# Patient Record
Sex: Female | Born: 1985
Health system: Southern US, Community
[De-identification: ages and names within clinical notes are randomized; demographics above are authoritative.]

## PROBLEM LIST (undated history)

## (undated) DIAGNOSIS — G709 Myoneural disorder, unspecified: Secondary | ICD-10-CM

## (undated) DIAGNOSIS — I10 Essential (primary) hypertension: Secondary | ICD-10-CM

## (undated) DIAGNOSIS — N83202 Unspecified ovarian cyst, left side: Secondary | ICD-10-CM

## (undated) DIAGNOSIS — G35D Multiple sclerosis, unspecified: Secondary | ICD-10-CM

## (undated) DIAGNOSIS — T7840XA Allergy, unspecified, initial encounter: Secondary | ICD-10-CM

## (undated) DIAGNOSIS — F419 Anxiety disorder, unspecified: Secondary | ICD-10-CM

## (undated) DIAGNOSIS — G35 Multiple sclerosis: Secondary | ICD-10-CM

## (undated) DIAGNOSIS — K219 Gastro-esophageal reflux disease without esophagitis: Secondary | ICD-10-CM

## (undated) DIAGNOSIS — H539 Unspecified visual disturbance: Secondary | ICD-10-CM

## (undated) HISTORY — DX: Anxiety disorder, unspecified: F41.9

## (undated) HISTORY — DX: Unspecified visual disturbance: H53.9

## (undated) HISTORY — DX: Gastro-esophageal reflux disease without esophagitis: K21.9

## (undated) HISTORY — DX: Allergy, unspecified, initial encounter: T78.40XA

## (undated) HISTORY — PX: WISDOM TOOTH EXTRACTION: SHX21

## (undated) HISTORY — DX: Essential (primary) hypertension: I10

---

## 2004-03-09 ENCOUNTER — Emergency Department (HOSPITAL_COMMUNITY): Admission: EM | Admit: 2004-03-09 | Discharge: 2004-03-09 | Payer: Self-pay | Admitting: Emergency Medicine

## 2004-10-30 ENCOUNTER — Encounter: Admission: RE | Admit: 2004-10-30 | Discharge: 2004-10-30 | Payer: Self-pay | Admitting: Neurology

## 2004-11-07 ENCOUNTER — Encounter: Admission: RE | Admit: 2004-11-07 | Discharge: 2004-11-07 | Payer: Self-pay | Admitting: Neurology

## 2005-09-11 ENCOUNTER — Emergency Department (HOSPITAL_COMMUNITY): Admission: EM | Admit: 2005-09-11 | Discharge: 2005-09-11 | Payer: Self-pay | Admitting: Family Medicine

## 2007-02-19 ENCOUNTER — Emergency Department (HOSPITAL_COMMUNITY): Admission: EM | Admit: 2007-02-19 | Discharge: 2007-02-19 | Payer: Self-pay | Admitting: Emergency Medicine

## 2008-10-18 ENCOUNTER — Emergency Department (HOSPITAL_COMMUNITY): Admission: EM | Admit: 2008-10-18 | Discharge: 2008-10-18 | Payer: Self-pay | Admitting: Family Medicine

## 2010-04-20 ENCOUNTER — Emergency Department (HOSPITAL_COMMUNITY): Admission: EM | Admit: 2010-04-20 | Discharge: 2010-04-20 | Payer: Self-pay | Admitting: Family Medicine

## 2010-10-18 ENCOUNTER — Inpatient Hospital Stay (INDEPENDENT_AMBULATORY_CARE_PROVIDER_SITE_OTHER)
Admission: RE | Admit: 2010-10-18 | Discharge: 2010-10-18 | Disposition: A | Discharge status: Home / Self Care | Payer: 59 | Source: Ambulatory Visit | Attending: Family Medicine | Admitting: Family Medicine

## 2010-10-18 DIAGNOSIS — J029 Acute pharyngitis, unspecified: Secondary | ICD-10-CM

## 2010-11-11 LAB — POCT URINALYSIS DIP (DEVICE)
Bilirubin Urine: NEGATIVE
Ketones, ur: NEGATIVE mg/dL
Specific Gravity, Urine: 1.02 (ref 1.005–1.030)

## 2010-11-11 LAB — POCT PREGNANCY, URINE: Preg Test, Ur: NEGATIVE

## 2010-11-11 LAB — URINE CULTURE

## 2010-11-11 LAB — WET PREP, GENITAL: Trich, Wet Prep: NONE SEEN

## 2010-11-11 LAB — GC/CHLAMYDIA PROBE AMP, GENITAL: GC Probe Amp, Genital: NEGATIVE

## 2011-04-08 ENCOUNTER — Inpatient Hospital Stay (INDEPENDENT_AMBULATORY_CARE_PROVIDER_SITE_OTHER)
Admission: RE | Admit: 2011-04-08 | Discharge: 2011-04-08 | Disposition: A | Discharge status: Home / Self Care | Payer: 59 | Source: Ambulatory Visit | Attending: Family Medicine | Admitting: Family Medicine

## 2011-04-08 DIAGNOSIS — R6889 Other general symptoms and signs: Secondary | ICD-10-CM

## 2013-04-16 ENCOUNTER — Ambulatory Visit (INDEPENDENT_AMBULATORY_CARE_PROVIDER_SITE_OTHER): Payer: PRIVATE HEALTH INSURANCE | Admitting: Internal Medicine

## 2013-04-16 VITALS — BP 124/82 | HR 101 | Temp 99.2°F | Resp 16 | Ht 65.25 in | Wt 244.0 lb

## 2013-04-16 DIAGNOSIS — R05 Cough: Secondary | ICD-10-CM

## 2013-04-16 DIAGNOSIS — J329 Chronic sinusitis, unspecified: Secondary | ICD-10-CM

## 2013-04-16 DIAGNOSIS — R059 Cough, unspecified: Secondary | ICD-10-CM

## 2013-04-16 MED ORDER — HYDROCODONE-HOMATROPINE 5-1.5 MG/5ML PO SYRP: 5.0000 mL | ORAL_SOLUTION | Freq: Four times a day (QID) | ORAL | Status: DC | PRN

## 2013-04-16 MED ORDER — AMOXICILLIN 500 MG PO CAPS: 1000.0000 mg | ORAL_CAPSULE | Freq: Two times a day (BID) | ORAL | Status: AC

## 2013-04-16 NOTE — Progress Notes (Signed)
  Subjective:    Patient ID: Courtney Meza, female    DOB: Apr 30, 1986, 27 y.o.   MRN: [id number]  HPIc/o congestion, sore throat, cough and fever getting progressively worse over the last 5 days A.m. Sputum Fatigue  No underlying illness    Review of Systems Noncontributory    Objective:   Physical Exam  BP 124/82  Pulse 101  Temp(Src) 99.2 F (37.3 C) (Oral)  Resp 16  Ht 5' 5.25" (1.657 m)  Wt 244 lb (110.678 kg)  BMI 40.31 kg/m2  SpO2 99%  LMP 04/09/2013 And conjunctiva clear Nares boggy with purulent discharge Maxillary areas tender to percussion and Throat clear No nodes Chest clear to auscultation      Assessment & Plan:  Acute sinusitis  Meds ordered this encounter  Medications  . HYDROcodone-homatropine (HYCODAN) 5-1.5 MG/5ML syrup    Sig: Take 5 mLs by mouth every 6 (six) hours as needed for cough.    Dispense:  120 mL    Refill:  0  . amoxicillin (AMOXIL) 500 MG capsule    Sig: Take 2 capsules (1,000 mg total) by mouth 2 (two) times daily.    Dispense:  40 capsule    Refill:  0   Sudafed otc

## 2014-08-11 ENCOUNTER — Ambulatory Visit (INDEPENDENT_AMBULATORY_CARE_PROVIDER_SITE_OTHER): Payer: 59 | Admitting: Family Medicine

## 2014-08-11 VITALS — BP 136/92 | HR 86 | Temp 98.6°F | Resp 18 | Ht 65.0 in | Wt 263.0 lb

## 2014-08-11 DIAGNOSIS — R309 Painful micturition, unspecified: Secondary | ICD-10-CM

## 2014-08-11 DIAGNOSIS — M545 Low back pain, unspecified: Secondary | ICD-10-CM

## 2014-08-11 DIAGNOSIS — N390 Urinary tract infection, site not specified: Secondary | ICD-10-CM

## 2014-08-11 LAB — POCT UA - MICROSCOPIC ONLY
CASTS, UR, LPF, POC: NEGATIVE
CRYSTALS, UR, HPF, POC: NEGATIVE
Mucus, UA: POSITIVE
YEAST UA: NEGATIVE

## 2014-08-11 LAB — POCT URINALYSIS DIPSTICK
Bilirubin, UA: NEGATIVE
GLUCOSE UA: NEGATIVE
Ketones, UA: NEGATIVE
Nitrite, UA: POSITIVE
PROTEIN UA: 100
Spec Grav, UA: 1.025
Urobilinogen, UA: 1
pH, UA: 7

## 2014-08-11 MED ORDER — CIPROFLOXACIN HCL 500 MG PO TABS
500.0000 mg | ORAL_TABLET | Freq: Two times a day (BID) | ORAL | Status: DC
Start: 2014-08-11 — End: 2014-08-15

## 2014-08-11 NOTE — Progress Notes (Signed)
Chief Complaint:  Chief Complaint  Patient presents with  . Dysuria    x2 days   . Flank Pain    right     HPI: Courtney Meza is a 29 y.o. female who is here for  2 day history od urinary sxs, increase frequency in last 1 week, now has had pain and spasms at the end of her urination, does not feel complete emptying.  No fevers, chills or nausea. Has some  Right sided flank pain. She ahs some dizziness, She has not taken her blood pressure today. 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 Holds her urine, is sexaully active. No STDs   Past Medical History  Diagnosis Date  . Hypertension    History reviewed. No pertinent past surgical history. History   Social History  . Marital Status: Single    Spouse Name: N/A    Number of Children: N/A  . Years of Education: N/A   Social History Main Topics  . Smoking status: Never Smoker   . Smokeless tobacco: None  . Alcohol Use: None  . Drug Use: None  . Sexual Activity: None   Other Topics Concern  . None   Social History Narrative   Family History  Problem Relation Age of Onset  . Diabetes Mother   . Hypertension Mother   . Diabetes Maternal Grandmother   . Hypertension Maternal Grandmother   . Liver cancer Maternal Grandfather    Allergies  Allergen Reactions  . Latex Hives   Prior to Admission medications   Medication Sig Start Date End Date Taking? Authorizing Provider  nebivolol (BYSTOLIC) 5 MG tablet Take 5 mg by mouth daily.   Yes Historical Provider, MD  HYDROcodone-homatropine (HYCODAN) 5-1.5 MG/5ML syrup Take 5 mLs by mouth every 6 (six) hours as needed for cough. Patient not taking: Reported on 08/11/2014 04/16/13   Leandrew Koyanagi, MD     ROS: The patient denies fevers, chills, night sweats, unintentional weight loss, chest pain, palpitations, wheezing, dyspnea on exertion, nausea, vomiting, abdominal pain,  hematuria, melena, numbness, weakness, or tingling.   All other systems have been reviewed and were  otherwise negative with the exception of those mentioned in the HPI and as above.    PHYSICAL EXAM: Filed Vitals:   08/11/14 0911  BP: 136/92  Pulse: 86  Temp: 98.6 F (37 C)  Resp: 18   Filed Vitals:   08/11/14 0911  Height: 5\' 5"  (1.651 m)  Weight: 263 lb (119.296 kg)   Body mass index is 43.77 kg/(m^2).  General: Alert, no acute distress HEENT:  Normocephalic, atraumatic, oropharynx patent. EOMI, PERRLA Cardiovascular:  Regular rate and rhythm, no rubs murmurs or gallops.  No Carotid bruits, radial pulse intact. No pedal edema.  Respiratory: Clear to auscultation bilaterally.  No wheezes, rales, or rhonchi.  No cyanosis, no use of accessory musculature GI: No organomegaly, abdomen is soft and non-tender, positive bowel sounds.  No masses. Skin: No rashes. Neurologic: Facial musculature symmetric. Psychiatric: Patient is appropriate throughout our interaction. Lymphatic: No cervical lymphadenopathy Musculoskeletal: Gait intact.   LABS: Results for orders placed or performed in visit on 08/11/14  POCT urinalysis dipstick  Result Value Ref Range   Color, UA yellow    Clarity, UA cloudy    Glucose, UA neg    Bilirubin, UA neg    Ketones, UA neg    Spec Grav, UA 1.025    Blood, UA large    pH, UA 7.0  Protein, UA 100    Urobilinogen, UA 1.0    Nitrite, UA pos    Leukocytes, UA small (1+)   POCT UA - Microscopic Only  Result Value Ref Range   WBC, Ur, HPF, POC TNTC    RBC, urine, microscopic TNTC    Bacteria, U Microscopic 4++    Mucus, UA pos    Epithelial cells, urine per micros 1-4    Crystals, Ur, HPF, POC neg    Casts, Ur, LPF, POC neg    Yeast, UA nega      EKG/XRAY:   Primary read interpreted by Dr. Marin Comment at [facility].   ASSESSMENT/PLAN: Encounter Diagnoses  Name Primary?  . Pain with urination   . Right-sided low back pain without sciatica   . UTI (urinary tract infection), uncomplicated Yes   RX cipro x 5 days Urine cx pending Push fluids F/u  prn   Gross sideeffects, risk and benefits, and alternatives of medications d/w patient. Patient is aware that all medications have potential sideeffects and we are unable to predict every sideeffect or drug-drug interaction that may occur.  Boluwatife Mutchler, [place], DO 08/11/2014 10:01 AM

## 2014-08-11 NOTE — Patient Instructions (Signed)

## 2014-08-13 LAB — URINE CULTURE: Colony Count: >=100000

## 2014-08-15 ENCOUNTER — Telehealth: Payer: Self-pay

## 2014-08-15 DIAGNOSIS — N390 Urinary tract infection, site not specified: Secondary | ICD-10-CM

## 2014-08-15 DIAGNOSIS — M545 Low back pain, unspecified: Secondary | ICD-10-CM

## 2014-08-15 DIAGNOSIS — R309 Painful micturition, unspecified: Secondary | ICD-10-CM

## 2014-08-15 MED ORDER — CIPROFLOXACIN HCL 500 MG PO TABS: 500.0000 mg | ORAL_TABLET | Freq: Two times a day (BID) | ORAL | Status: DC

## 2014-08-15 NOTE — Telephone Encounter (Signed)
Please call her and let her know that I sent in a refill for her cipro, if she feels better at day 7 after using abx then stop and do no take anymore, otherwise finish out th eabx for a total of 10 days. Thanks Dr Marin Comment

## 2014-08-15 NOTE — Telephone Encounter (Signed)
Pt called returning a missed call. CB # [phone]

## 2014-08-15 NOTE — Telephone Encounter (Signed)
Lab results call to pt: URine cx grew out E coli, sensitive to Cipro which she is on. How is she feeling? IF she is still symptomatic but better we can extend it out a little longer, she was on it for 5 days.  Spoke to pt- she only has 1 pill left. She does want to try to get a little longer on abx.  She has a little bit of the same sensation. She is having urgency at night while sleeping. No burning just the urgency. No blood in urine

## 2014-08-16 NOTE — Telephone Encounter (Signed)
Pt.notified

## 2016-09-14 ENCOUNTER — Other Ambulatory Visit: Payer: Self-pay | Admitting: Obstetrics and Gynecology

## 2016-09-14 DIAGNOSIS — R947 Abnormal results of other endocrine function studies: Secondary | ICD-10-CM

## 2016-09-23 ENCOUNTER — Ambulatory Visit
Admission: RE | Admit: 2016-09-23 | Discharge: 2016-09-23 | Disposition: A | Discharge status: Home / Self Care | Payer: BLUE CROSS/BLUE SHIELD | Source: Ambulatory Visit | Attending: Obstetrics and Gynecology | Admitting: Obstetrics and Gynecology

## 2016-09-23 DIAGNOSIS — R947 Abnormal results of other endocrine function studies: Secondary | ICD-10-CM

## 2016-09-23 MED ORDER — GADOBENATE DIMEGLUMINE 529 MG/ML IV SOLN
10.0000 mL | Freq: Once | INTRAVENOUS | Status: AC | PRN
  Administered 2016-09-23: 10 mL via INTRAVENOUS

## 2016-09-29 ENCOUNTER — Telehealth: Payer: Self-pay | Admitting: *Deleted

## 2016-09-29 NOTE — Telephone Encounter (Signed)
Per RAS, I have spoken with pt. and given appt. for 10-03-16, arrival time 1pm for a 1:30pm appt/fim

## 2016-10-03 ENCOUNTER — Encounter: Payer: Self-pay | Admitting: *Deleted

## 2016-10-03 ENCOUNTER — Ambulatory Visit (INDEPENDENT_AMBULATORY_CARE_PROVIDER_SITE_OTHER): Payer: BLUE CROSS/BLUE SHIELD | Admitting: Neurology

## 2016-10-03 ENCOUNTER — Encounter: Payer: Self-pay | Admitting: Neurology

## 2016-10-03 VITALS — BP 160/101 | HR 84 | Resp 18 | Ht 65.0 in | Wt 281.0 lb

## 2016-10-03 DIAGNOSIS — R292 Abnormal reflex: Secondary | ICD-10-CM

## 2016-10-03 DIAGNOSIS — E559 Vitamin D deficiency, unspecified: Secondary | ICD-10-CM

## 2016-10-03 DIAGNOSIS — Z87898 Personal history of other specified conditions: Secondary | ICD-10-CM | POA: Diagnosis not present

## 2016-10-03 DIAGNOSIS — G35 Multiple sclerosis: Secondary | ICD-10-CM | POA: Diagnosis not present

## 2016-10-03 DIAGNOSIS — R350 Frequency of micturition: Secondary | ICD-10-CM | POA: Diagnosis not present

## 2016-10-03 DIAGNOSIS — R7989 Other specified abnormal findings of blood chemistry: Secondary | ICD-10-CM

## 2016-10-03 DIAGNOSIS — Z8669 Personal history of other diseases of the nervous system and sense organs: Secondary | ICD-10-CM | POA: Diagnosis not present

## 2016-10-03 DIAGNOSIS — G35A Relapsing-remitting multiple sclerosis: Secondary | ICD-10-CM | POA: Insufficient documentation

## 2016-10-03 NOTE — Progress Notes (Signed)
[facility]  PATIENT: Courtney Meza DOB: Dec 20, 1985  REFERRING DOCTOR OR PCP:  Concha Norway (referred by) is Dr. Melba Coon;  PCP is Glendale Chard SOURCE: Patient, notes from Dr. Melba Coon, [place] and lab reports, MRI images 3 onPACS.  _________________________________   HISTORICAL  CHIEF COMPLAINT:  Chief Complaint  Patient presents with  . Abnormal MRI brain    Courtney Meza is here with her husband Courtney Meza to discuss MRI results.  Sts. due to increased prolactin levels,  and hx. of enlarged pituitary gland her ob/gyn ordered an MRI brain, which is abnormal.  Denies sx.  sts. she does have partial vision loss right eye--sts. around age [age], she was driving home, had a severe h/a, had sudden partial loss of vision right eye. Sts. no clear dx. was reachec/fim    HISTORY OF PRESENT ILLNESS:  I had the pleasure seeing your patient, Courtney Meza, at [facility] for neurologic consultation regarding her abnormal MRI and concern about the possibility of multiple sclerosis.  In April 2006, while driving home she had the sudden onset of reduced right vision like she was looking through a heavy fog.  Vision was unchanged for several months and then began to improve.    Vision improved over the next year but not to baseline and has been stable since.   She feels that vision is still dim on that side and there is less contrast.    She has not noted color vision changes.   Later in 2006, she had the onset of right facial weakness.  She had two weeks of facial twitching and then had facial weakness that seemed to develop over minutes to one hour.  She was told she had Bell's palsy.  She is uncertain whether she had complete loss of strength in the upper face.  She had no other major symptoms over the next decade.   However, last year she had numbness in the left arm over a couple weeks and she was sent for a NCV/EMG.   She feels the numbness completely resolved to  baseline.  The 2006 MRI showed pituitary enlargement.   Prolactin levels were recently elevated and she had a follow-up MRI showing stable pituitary enlargement and a pars intermedius cyst.   She also had multiple white matter lesions, periventricular > juxtacortical, a left midbrain and right pons lesion.  Brain lesions were not present on the previous MRI.  I spoke to her gynecologist, Dr. Melba Coon, last week and reviewed the MRI with her.  She denies any difficulty with gait, strength or sensation. She does have urinary urgency and frequency but no incontinence. She notes nocturia 4 times a night for the past 2 years.    She notes more fatigue over the past 2 years. She is uncertain if this is due to her work as she works night shifts. However, she does sleep well. She does not snore.  She denies any difficulty with mood or cognition.  She had low vitamin D in the past and was on 50,000 units weekly for a while and now takes over-the-counter supplements.  I personally reviewed the MRIs of the brain from 2006 and the MRI from 09/23/2016. The MRI of the brain in 2006 did not show any brain lesions.   However, the second MRI performed 11/07/2004 appeared to show diffusion weighted hyperintensity in the right optic nerve as well as enhancement of the right optic nerve.  This did not appear on the original 10/30/2004 MRI.  The MRI from 09/23/2016 shows multiple T2/FLAIR hyperintense foci in the hemispheres, predominantly in the periventricular white matter but also in the juxtacortical white matter. Additionally there are left midbrain and right pontine lesions.   There is questionable optic nerve enhancement.   MRI images were reviewed in Mr. And Mrs. Courtney Meza's presence.  REVIEW OF SYSTEMS: Constitutional: No fevers, chills, sweats, or change in appetite Eyes: No visual changes, double vision, eye pain Ear, nose and throat: No hearing loss, ear pain, nasal congestion, sore throat Cardiovascular: No  chest pain, palpitations Respiratory: No shortness of breath at rest or with exertion.   No wheezes GastrointestinaI: No nausea, vomiting, diarrhea, abdominal pain, fecal incontinence Genitourinary: No dysuria, urinary retention or frequency.  No nocturia. Musculoskeletal: No neck pain, back pain Integumentary: No rash, pruritus, skin lesions Neurological: as above Psychiatric: No depression at this time.  No anxiety Endocrine: No palpitations, diaphoresis, change in appetite, change in weigh or increased thirst Hematologic/Lymphatic: No anemia, purpura, petechiae. Allergic/Immunologic: No itchy/runny eyes, nasal congestion, recent allergic reactions, rashes  ALLERGIES: Allergies  Allergen Reactions  . Latex Hives    HOME MEDICATIONS:  Current Outpatient Prescriptions:  .  cholecalciferol (VITAMIN D) 1000 units tablet, Take 5,000 Units by mouth daily., Disp: , Rfl:  .  nebivolol (BYSTOLIC) 5 MG tablet, Take 5 mg by mouth daily., Disp: , Rfl:  .  Prenatal Multivit-Min-Fe-FA (PRENATAL VITAMINS PO), Take by mouth., Disp: , Rfl:  .  Dimethyl Fumarate (TECFIDERA) 120 & 240 MG MISC, Take by mouth., Disp: , Rfl:  .  Dimethyl Fumarate 240 MG CPDR, Take 240 mg by mouth 2 (two) times daily., Disp: , Rfl:  .  hydrochlorothiazide (MICROZIDE) 12.5 MG capsule, TK 1 C PO QD, Disp: , Rfl: 1  PAST MEDICAL HISTORY: Past Medical History:  Diagnosis Date  . Hypertension   . Vision abnormalities     PAST SURGICAL HISTORY: No past surgical history on file.  FAMILY HISTORY: Family History  Problem Relation Age of Onset  . Diabetes Mother   . Hypertension Mother   . Diabetes Maternal Grandmother   . Hypertension Maternal Grandmother   . Liver cancer Maternal Grandfather     SOCIAL HISTORY:  Social History   Social History  . Marital status: Single    Spouse name: N/A  . Number of children: N/A  . Years of education: N/A   Occupational History  . Not on file.   Social History  Main Topics  . Smoking status: Never Smoker  . Smokeless tobacco: Never Used  . Alcohol use Not on file  . Drug use: Unknown  . Sexual activity: Not on file   Other Topics Concern  . Not on file   Social History Narrative  . No narrative on file     PHYSICAL EXAM  Vitals:   10/03/16 1324  BP: (!) 160/101  Pulse: 84  Resp: 18  Weight: 281 lb (127.5 kg)  Height: 5\' 5"  (1.651 m)    Body mass index is 46.76 kg/m.   General: The patient is well-developed and well-nourished and in no acute distress  Eyes:  Funduscopic exam shows normal optic discs and retinal vessels.  Neck: The neck is supple, no carotid bruits are noted.  The neck is nontender.  Cardiovascular: The heart has a regular rate and rhythm with a normal S1 and S2. There were no murmurs, gallops or rubs. Lungs are clear to auscultation.  Skin: Extremities are without significant edema.  Musculoskeletal:  Back is  nontender  Neurologic Exam  Mental status: The patient is alert and oriented x 3 at the time of the examination. The patient has apparent normal recent and remote memory, with an apparently normal attention span and concentration ability.   Speech is normal.  Cranial nerves: Extraocular movements are full. VA is 20/30 OS and 20/100 OD.   Has a 1+ right afferent pupillary defect. Pupils equally reactive to accomodation.  Visual fields are full.  She reports symmetric color vision but dimmed vision in general out of the right eye. Facial symmetry is present. There is good facial sensation to soft touch bilaterally.Facial strength is normal.  Trapezius and sternocleidomastoid strength is normal. No dysarthria is noted.  The tongue is midline, and the patient has symmetric elevation of the soft palate. No obvious hearing deficits are noted.  Motor:  Muscle bulk is normal.   Tone is normal. Strength is  5 / 5 in all 4 extremities.   Sensory: Sensory testing is intact to pinprick, soft touch and vibration  sensation in all 4 extremities.  Coordination: Cerebellar testing reveals good finger-nose-finger and heel-to-shin bilaterally.  Gait and station: Station is normal.   Gait is normal. Tandem gait is normal. Romberg is negative.   Reflexes: Deep tendon reflexes are symmetric and normal in arms but elevated in legs with spread at the knees   Plantar responses are flexor.      DIAGNOSTIC DATA (LABS, IMAGING, TESTING) - I reviewed patient records, labs, notes, testing and imaging myself where available.      ASSESSMENT AND PLAN  Multiple sclerosis ([place]) - Plan: VITAMIN D 25 Hydroxy (Vit-D Deficiency, Fractures), CBC with Differential/Platelet, Hepatic function panel  Low vitamin D level - Plan: VITAMIN D 25 Hydroxy (Vit-D Deficiency, Fractures)  History of optic neuritis  History of numbness  Hyperreflexia  Urinary frequency   In summary, Courtney Meza is a 31 year old woman who had an episode of neuritis in 2006, right facial weakness in 2006, left arm numbness in 2016 who has an MRI of the brain showing multiple lesions hemispheres and brainstem consistent with multiple sclerosis. Her exam shows an afferent pupillary defect and hyperreflexia.    Her history, exam and MRI are all consistent with relapsing remitting multiple sclerosis.    To prevent further relapses and disability, we need to initiate a disease modifying therapy. We went over her options and she was most interested in the oral options.   We went over the pros and cons of the different disease modifying therapies and she opted to begin Tecfidera.   We will check a CBC. I'll also check a vitamin D to see if that needs to be supplemented with high-dose supplements again.  She and her husband are thinking about trying to have a baby soon and she will stop the Tecfidera if they decide to do so or if she becomes pregnant.  She will return to see me in 3 months or sooner if there are new or worsening neurologic  symptoms.  Thank you for asking me to see Courtney Meza for neurologic consultation. Please let me know if I can be of further assistance with her or other patients in the future.   Courtney Bart A. Felecia Shelling, MD, PhD [id number], [id number] PM Certified in Neurology, Clinical Neurophysiology, Sleep Medicine, Pain Medicine and Neuroimaging  [facility] [address], [place], [place] [address] [phone]

## 2016-10-04 LAB — CBC WITH DIFFERENTIAL/PLATELET
BASOS ABS: 0 10*3/uL (ref 0.0–0.2)
Basos: 0 %
EOS (ABSOLUTE): 0.1 10*3/uL (ref 0.0–0.4)
Eos: 3 %
Hematocrit: 38.2 % (ref 34.0–46.6)
Hemoglobin: 12.2 g/dL (ref 11.1–15.9)
IMMATURE GRANS (ABS): 0 10*3/uL (ref 0.0–0.1)
IMMATURE GRANULOCYTES: 0 %
LYMPHS: 42 %
Lymphocytes Absolute: 2.2 10*3/uL (ref 0.7–3.1)
MCH: 26.1 pg — ABNORMAL LOW (ref 26.6–33.0)
MCHC: 31.9 g/dL (ref 31.5–35.7)
MCV: 82 fL (ref 79–97)
MONOCYTES: 7 %
Monocytes Absolute: 0.4 10*3/uL (ref 0.1–0.9)
NEUTROS PCT: 48 %
Neutrophils Absolute: 2.5 10*3/uL (ref 1.4–7.0)
PLATELETS: 378 10*3/uL (ref 150–379)
RBC: 4.67 x10E6/uL (ref 3.77–5.28)
RDW: 14.3 % (ref 12.3–15.4)
WBC: 5.2 10*3/uL (ref 3.4–10.8)

## 2016-10-04 LAB — HEPATIC FUNCTION PANEL
ALBUMIN: 4.1 g/dL (ref 3.5–5.5)
ALT: 13 IU/L (ref 0–32)
AST: 11 IU/L (ref 0–40)
Alkaline Phosphatase: 83 IU/L (ref 39–117)
BILIRUBIN TOTAL: 0.2 mg/dL (ref 0.0–1.2)
BILIRUBIN, DIRECT: 0.07 mg/dL (ref 0.00–0.40)
TOTAL PROTEIN: 7.1 g/dL (ref 6.0–8.5)

## 2016-10-04 LAB — VITAMIN D 25 HYDROXY (VIT D DEFICIENCY, FRACTURES): VIT D 25 HYDROXY: 41.4 ng/mL (ref 30.0–100.0)

## 2016-10-05 ENCOUNTER — Telehealth: Payer: Self-pay | Admitting: *Deleted

## 2016-10-05 NOTE — Telephone Encounter (Signed)
Fax received from [place] [facility] [place].  Tecfidera PA approved for dates 10-04-16 thru 07-31-17.  Ref# [id number]/[id number]

## 2016-10-07 ENCOUNTER — Telehealth: Payer: Self-pay | Admitting: *Deleted

## 2016-10-07 NOTE — Telephone Encounter (Signed)
LMOM for pt. that per RAS, labs done in our office are fine.  She does not need to return this call unless she has questions/fim

## 2016-10-07 NOTE — Telephone Encounter (Signed)
-----   Message from Britt Bottom, MD sent at 10/06/2016  5:11 PM EST ----- Please let the patient know that the lab work is fine.

## 2016-11-10 ENCOUNTER — Telehealth: Payer: Self-pay | Admitting: *Deleted

## 2016-11-10 MED ORDER — DIMETHYL FUMARATE 240 MG PO CPDR: 240.0000 mg | DELAYED_RELEASE_CAPSULE | Freq: Two times a day (BID) | ORAL | 3 refills | Status: DC

## 2016-11-10 NOTE — Telephone Encounter (Signed)
Tecfidera escribed to [employer] [employer] per faxed request/fim

## 2016-12-12 NOTE — Patient Instructions (Addendum)
Your procedure is scheduled on:  Monday, Dec 19, 2016  Enter through the [employer] [facility] at:  8:00 AM   Pick up the phone at the desk and dial [phone].  Call this number if you have problems the morning of surgery: [phone].  Remember: Do NOT eat food or drink after:  Midnight Sunday  Take these medicines the morning of surgery with a SIP OF WATER:  Dimethyl, Hydrochlorothiazide, Bystolic  Stop ALL herbal medications at this time  Do NOT smoke the day of surgery.  Do NOT wear jewelry (body piercing), metal hair clips/bobby pins, make-up, or nail polish. Do NOT wear lotions, powders, or perfumes.  You may wear deodorant. Do NOT shave for 48 hours prior to surgery. Do NOT bring valuables to the hospital. Contacts, dentures, or bridgework may not be worn into surgery.  Have a responsible adult drive you home and stay with you for 24 hours after your procedure  Bring a copy of your healthcare power of attorney and living will documents.

## 2016-12-13 ENCOUNTER — Encounter (HOSPITAL_COMMUNITY)
Admission: RE | Admit: 2016-12-13 | Discharge: 2016-12-13 | Disposition: A | Discharge status: Home / Self Care | Payer: BLUE CROSS/BLUE SHIELD | Source: Ambulatory Visit | Attending: Obstetrics and Gynecology | Admitting: Obstetrics and Gynecology

## 2016-12-13 ENCOUNTER — Encounter (HOSPITAL_COMMUNITY): Payer: Self-pay

## 2016-12-13 DIAGNOSIS — Z01812 Encounter for preprocedural laboratory examination: Secondary | ICD-10-CM | POA: Insufficient documentation

## 2016-12-13 HISTORY — DX: Multiple sclerosis, unspecified: G35.D

## 2016-12-13 HISTORY — DX: Myoneural disorder, unspecified: G70.9

## 2016-12-13 HISTORY — DX: Multiple sclerosis: G35

## 2016-12-13 LAB — TYPE AND SCREEN
ABO/RH(D): O POS
Antibody Screen: NEGATIVE

## 2016-12-13 LAB — CBC
HCT: 35.3 % — ABNORMAL LOW (ref 36.0–46.0)
Hemoglobin: 11.4 g/dL — ABNORMAL LOW (ref 12.0–15.0)
MCH: 26.9 pg (ref 26.0–34.0)
MCHC: 32.3 g/dL (ref 30.0–36.0)
MCV: 83.3 fL (ref 78.0–100.0)
Platelets: 286 10*3/uL (ref 150–400)
RBC: 4.24 MIL/uL (ref 3.87–5.11)
RDW: 14.7 % (ref 11.5–15.5)
WBC: 2.6 10*3/uL — ABNORMAL LOW (ref 4.0–10.5)

## 2016-12-13 LAB — DIFFERENTIAL
BASOS PCT: 1 %
Band Neutrophils: 0 %
Basophils Absolute: 0 10*3/uL (ref 0.0–0.1)
Blasts: 0 %
EOS PCT: 6 %
Eosinophils Absolute: 0.2 10*3/uL (ref 0.0–0.7)
LYMPHS ABS: 1.5 10*3/uL (ref 0.7–4.0)
LYMPHS PCT: 60 %
MONOS PCT: 6 %
Metamyelocytes Relative: 0 %
Monocytes Absolute: 0.2 10*3/uL (ref 0.1–1.0)
Myelocytes: 0 %
NEUTROS PCT: 27 %
Neutro Abs: 0.7 10*3/uL — ABNORMAL LOW (ref 1.7–7.7)
Promyelocytes Absolute: 0 %
nRBC: 0 /100 WBC

## 2016-12-13 LAB — BASIC METABOLIC PANEL
ANION GAP: 5 (ref 5–15)
BUN: 14 mg/dL (ref 6–20)
CALCIUM: 8.8 mg/dL — AB (ref 8.9–10.3)
CO2: 31 mmol/L (ref 22–32)
Chloride: 102 mmol/L (ref 101–111)
Creatinine, Ser: 0.63 mg/dL (ref 0.44–1.00)
GFR calc non Af Amer: >60 mL/min (ref >60)
Glucose, Bld: 93 mg/dL (ref 65–99)
Potassium: 3.9 mmol/L (ref 3.5–5.1)
SODIUM: 138 mmol/L (ref 135–145)

## 2016-12-13 LAB — ABO/RH: ABO/RH(D): O POS

## 2016-12-13 LAB — PATHOLOGIST SMEAR REVIEW

## 2016-12-13 NOTE — Pre-Procedure Instructions (Signed)
Dr. Adele Barthel made aware of low WBC's no new orders at this time.  I spoke with Estill Bamberg at Dr. Bennie Pierini office and made them aware also.

## 2016-12-14 ENCOUNTER — Telehealth: Payer: Self-pay | Admitting: Neurology

## 2016-12-14 NOTE — Telephone Encounter (Signed)
I have spoken with Courtney Meza.  CBC is 2.6.  RAS hs viewed labs this afternoon.  He would like for pt. to continue Tecfidera and repeat CBC next week.  I have explained to pt. Tecfidera's effect on the immune system, and that this effect is reflected in a lower than normal wbc ct.  However, wbc often rebounds.  She does not currently have any signs of infection.  She is having a laparoscopic procedure next week to remove an ? ovarian cyst and possibly a fallopian tube.  Per RAS, ok for procedure.  Pt. verbalized understanding of same, is agreeable.  I will give her a reminder call next week when it is time to repeat labwork/fim

## 2016-12-14 NOTE — Telephone Encounter (Signed)
Patient called office in reference to tecfidera and labs.  Patient had labs drawn yesterday at [facility] with [facility] blood count being 2.  Please call

## 2016-12-18 NOTE — H&P (Signed)
Courtney Meza is an 31 y.o. female G0P0 with newly diagnosed MS and persistent ovarian cyst.  4x5x6 cm abutting B ovaries, smooth wall and low-level echos.  Having some discomfort.  In process of workup for fertility issues, prolactin elevated - MRI brain reveals white matter lesions - MS.  Dr Felecia Shelling is neurologist. Cyst also has excresences, will perform cystectomy vs USO, d/w pr/b/a - wishes to proceed. Also with morbid obesity.  Pertinent Gynecological History: CA-125 WNL Elevated prolactin on PCOS labs G0P0 H/o abn pap, last 07/2016 WNL H/o Chl  Menstrual History:  Patient's last menstrual period was 12/10/2016 (exact date).    Past Medical History:  Diagnosis Date  . Hypertension   . Multiple sclerosis (Courtney Meza)   . Neuromuscular disorder (Courtney Meza)   . Vision abnormalities     Past Surgical History:  Procedure Laterality Date  . WISDOM TOOTH EXTRACTION      Family History  Problem Relation Age of Onset  . Diabetes Mother   . Hypertension Mother   . Diabetes Maternal Grandmother   . Hypertension Maternal Grandmother   . Liver cancer Maternal Grandfather     Social History:  reports that she has never smoked. She has never used smokeless tobacco. She reports that she does not drink alcohol or use drugs. married  Allergies:  Allergies  Allergen Reactions  . Latex Hives    New MS med, bystolic, pNV, Vit D  Review of Systems  Constitutional: Negative.   HENT: Negative.   Eyes: Negative.   Respiratory: Negative.   Cardiovascular: Negative.   Gastrointestinal: Negative.   Genitourinary: Negative.   Musculoskeletal: Positive for back pain.  Skin: Negative.   Neurological: Negative.   Psychiatric/Behavioral: Negative.     Last menstrual period 12/10/2016. Physical Exam  Constitutional: She is oriented to person, place, and time. She appears well-developed and well-nourished.  Morbid obesity  HENT:  Head: Normocephalic and atraumatic.  Cardiovascular: Normal  rate and regular rhythm.   Respiratory: Effort normal and breath sounds normal. No respiratory distress. She has no wheezes.  GI: Soft. Bowel sounds are normal. She exhibits no distension. There is no tenderness.  Musculoskeletal: Normal range of motion.  Neurological: She is alert and oriented to person, place, and time.  Skin: Skin is warm and dry.  Psychiatric: She has a normal mood and affect. Her behavior is normal.   6x4x5 cm cyst with smooth margins, low level echos and excresences, persistent Nl CA-125  Assessment/Plan: 30yo GoP0 with large ovarian cyst for cystectomy vs USO D/w pt r/b/a of surgery Will proceed  Courtney Meza 12/18/2016, 8:50 AM

## 2016-12-19 ENCOUNTER — Encounter (HOSPITAL_COMMUNITY)
Admission: RE | Disposition: A | Discharge status: Home / Self Care | Payer: Self-pay | Source: Ambulatory Visit | Attending: Obstetrics and Gynecology

## 2016-12-19 ENCOUNTER — Encounter (HOSPITAL_COMMUNITY): Payer: Self-pay

## 2016-12-19 ENCOUNTER — Ambulatory Visit (HOSPITAL_COMMUNITY)
Admission: RE | Admit: 2016-12-19 | Discharge: 2016-12-19 | Disposition: A | Discharge status: Home / Self Care | Payer: BLUE CROSS/BLUE SHIELD | Source: Ambulatory Visit | Attending: Obstetrics and Gynecology | Admitting: Obstetrics and Gynecology

## 2016-12-19 ENCOUNTER — Ambulatory Visit (HOSPITAL_COMMUNITY): Payer: BLUE CROSS/BLUE SHIELD | Admitting: Anesthesiology

## 2016-12-19 DIAGNOSIS — I10 Essential (primary) hypertension: Secondary | ICD-10-CM | POA: Diagnosis not present

## 2016-12-19 DIAGNOSIS — D271 Benign neoplasm of left ovary: Secondary | ICD-10-CM | POA: Diagnosis not present

## 2016-12-19 DIAGNOSIS — N83202 Unspecified ovarian cyst, left side: Secondary | ICD-10-CM | POA: Diagnosis present

## 2016-12-19 DIAGNOSIS — Z91040 Latex allergy status: Secondary | ICD-10-CM | POA: Diagnosis not present

## 2016-12-19 DIAGNOSIS — G35 Multiple sclerosis: Secondary | ICD-10-CM | POA: Diagnosis not present

## 2016-12-19 HISTORY — DX: Unspecified ovarian cyst, left side: N83.202

## 2016-12-19 HISTORY — PX: LAPAROSCOPIC OVARIAN CYSTECTOMY: SHX6248

## 2016-12-19 LAB — PREGNANCY, URINE: Preg Test, Ur: NEGATIVE

## 2016-12-19 SURGERY — EXCISION, CYST, OVARY, LAPAROSCOPIC
Anesthesia: General | Site: Abdomen | Laterality: Left

## 2016-12-19 MED ORDER — OXYCODONE HCL 5 MG PO TABS
5.0000 mg | ORAL_TABLET | Freq: Once | ORAL | Status: AC
Start: 2016-12-19 — End: 2016-12-19
  Administered 2016-12-19: 5 mg via ORAL

## 2016-12-19 MED ORDER — PROPOFOL 10 MG/ML IV BOLUS
INTRAVENOUS | Status: DC | PRN
  Administered 2016-12-19: 200 mg via INTRAVENOUS

## 2016-12-19 MED ORDER — LACTATED RINGERS IV SOLN
INTRAVENOUS | Status: DC
  Administered 2016-12-19 (×3): via INTRAVENOUS

## 2016-12-19 MED ORDER — FENTANYL CITRATE (PF) 100 MCG/2ML IJ SOLN: 25.0000 ug | INTRAMUSCULAR | Status: DC | PRN

## 2016-12-19 MED ORDER — LIDOCAINE HCL (CARDIAC) 20 MG/ML IV SOLN
INTRAVENOUS | Status: DC | PRN
  Administered 2016-12-19: 80 mg via INTRAVENOUS

## 2016-12-19 MED ORDER — DEXAMETHASONE SODIUM PHOSPHATE 10 MG/ML IJ SOLN
INTRAMUSCULAR | Status: AC
  Filled 2016-12-19: qty 1

## 2016-12-19 MED ORDER — KETOROLAC TROMETHAMINE 30 MG/ML IJ SOLN
INTRAMUSCULAR | Status: AC
  Filled 2016-12-19: qty 1

## 2016-12-19 MED ORDER — MIDAZOLAM HCL 2 MG/2ML IJ SOLN
INTRAMUSCULAR | Status: AC
  Filled 2016-12-19: qty 2

## 2016-12-19 MED ORDER — IBUPROFEN 800 MG PO TABS: 800.0000 mg | ORAL_TABLET | Freq: Three times a day (TID) | ORAL | 1 refills | Status: DC | PRN

## 2016-12-19 MED ORDER — GLYCOPYRROLATE 0.2 MG/ML IJ SOLN
INTRAMUSCULAR | Status: AC
  Filled 2016-12-19: qty 3

## 2016-12-19 MED ORDER — SUGAMMADEX SODIUM 500 MG/5ML IV SOLN
INTRAVENOUS | Status: AC
  Filled 2016-12-19: qty 5

## 2016-12-19 MED ORDER — MIDAZOLAM HCL 2 MG/2ML IJ SOLN
INTRAMUSCULAR | Status: DC | PRN
  Administered 2016-12-19: 0.5 mg via INTRAVENOUS
  Administered 2016-12-19: 1.5 mg via INTRAVENOUS

## 2016-12-19 MED ORDER — FENTANYL CITRATE (PF) 100 MCG/2ML IJ SOLN
INTRAMUSCULAR | Status: DC | PRN
  Administered 2016-12-19: 100 ug via INTRAVENOUS
  Administered 2016-12-19: 50 ug via INTRAVENOUS
  Administered 2016-12-19: 100 ug via INTRAVENOUS

## 2016-12-19 MED ORDER — ONDANSETRON HCL 4 MG/2ML IJ SOLN
INTRAMUSCULAR | Status: AC
  Filled 2016-12-19: qty 2

## 2016-12-19 MED ORDER — LACTATED RINGERS IV SOLN: INTRAVENOUS | Status: DC

## 2016-12-19 MED ORDER — SCOPOLAMINE 1 MG/3DAYS TD PT72
1.0000 | MEDICATED_PATCH | Freq: Once | TRANSDERMAL | Status: DC
  Administered 2016-12-19: 1.5 mg via TRANSDERMAL

## 2016-12-19 MED ORDER — ROCURONIUM BROMIDE 100 MG/10ML IV SOLN
INTRAVENOUS | Status: AC
  Filled 2016-12-19: qty 1

## 2016-12-19 MED ORDER — SCOPOLAMINE 1 MG/3DAYS TD PT72
MEDICATED_PATCH | TRANSDERMAL | Status: AC
  Administered 2016-12-19: 1.5 mg via TRANSDERMAL
  Filled 2016-12-19: qty 1

## 2016-12-19 MED ORDER — PROPOFOL 10 MG/ML IV BOLUS
INTRAVENOUS | Status: AC
  Filled 2016-12-19: qty 20

## 2016-12-19 MED ORDER — ONDANSETRON HCL 4 MG/2ML IJ SOLN
INTRAMUSCULAR | Status: DC | PRN
  Administered 2016-12-19 (×2): 2 mg via INTRAVENOUS

## 2016-12-19 MED ORDER — GLYCOPYRROLATE 0.2 MG/ML IJ SOLN
INTRAMUSCULAR | Status: DC | PRN
  Administered 2016-12-19 (×2): 0.1 mg via INTRAVENOUS

## 2016-12-19 MED ORDER — ROCURONIUM BROMIDE 100 MG/10ML IV SOLN
INTRAVENOUS | Status: DC | PRN
  Administered 2016-12-19: 50 mg via INTRAVENOUS

## 2016-12-19 MED ORDER — FENTANYL CITRATE (PF) 250 MCG/5ML IJ SOLN
INTRAMUSCULAR | Status: AC
  Filled 2016-12-19: qty 5

## 2016-12-19 MED ORDER — BUPIVACAINE HCL (PF) 0.25 % IJ SOLN
INTRAMUSCULAR | Status: AC
  Filled 2016-12-19: qty 30

## 2016-12-19 MED ORDER — NEOSTIGMINE METHYLSULFATE 10 MG/10ML IV SOLN
INTRAVENOUS | Status: AC
  Filled 2016-12-19: qty 1

## 2016-12-19 MED ORDER — LIDOCAINE HCL (CARDIAC) 20 MG/ML IV SOLN
INTRAVENOUS | Status: AC
  Filled 2016-12-19: qty 5

## 2016-12-19 MED ORDER — OXYCODONE HCL 5 MG PO TABS: ORAL_TABLET | ORAL | 0 refills | Status: DC

## 2016-12-19 MED ORDER — DEXAMETHASONE SODIUM PHOSPHATE 10 MG/ML IJ SOLN
INTRAMUSCULAR | Status: DC | PRN
  Administered 2016-12-19: 10 mg via INTRAVENOUS

## 2016-12-19 MED ORDER — BUPIVACAINE HCL (PF) 0.25 % IJ SOLN
INTRAMUSCULAR | Status: DC | PRN
  Administered 2016-12-19: 19 mL

## 2016-12-19 MED ORDER — SUGAMMADEX SODIUM 500 MG/5ML IV SOLN
INTRAVENOUS | Status: DC | PRN
  Administered 2016-12-19: 300 mg via INTRAVENOUS

## 2016-12-19 MED ORDER — KETOROLAC TROMETHAMINE 30 MG/ML IJ SOLN: INTRAMUSCULAR | Status: DC | PRN

## 2016-12-19 MED ORDER — OXYCODONE HCL 5 MG PO TABS
ORAL_TABLET | ORAL | Status: AC
  Filled 2016-12-19: qty 1

## 2016-12-19 MED ORDER — KETOROLAC TROMETHAMINE 30 MG/ML IJ SOLN
INTRAMUSCULAR | Status: DC | PRN
  Administered 2016-12-19: 30 mg via INTRAVENOUS

## 2016-12-19 SURGICAL SUPPLY — 30 items
CABLE HIGH FREQUENCY MONO STRZ (ELECTRODE) IMPLANT
CLOTH BEACON ORANGE TIMEOUT ST (SAFETY) ×2 IMPLANT
DERMABOND ADVANCED (GAUZE/BANDAGES/DRESSINGS) ×1
DERMABOND ADVANCED .7 DNX12 (GAUZE/BANDAGES/DRESSINGS) ×1 IMPLANT
DRSG OPSITE POSTOP 3X4 (GAUZE/BANDAGES/DRESSINGS) ×4 IMPLANT
DURAPREP 26ML APPLICATOR (WOUND CARE) ×2 IMPLANT
GLOVE BIO SURGEON STRL SZ 6.5 (GLOVE) IMPLANT
GLOVE BIOGEL PI IND STRL 7.0 (GLOVE) ×4 IMPLANT
GLOVE BIOGEL PI INDICATOR 7.0 (GLOVE) ×4
GOWN STRL REUS W/TWL LRG LVL3 (GOWN DISPOSABLE) ×4 IMPLANT
NEEDLE INSUFFLATION 120MM (ENDOMECHANICALS) ×2 IMPLANT
NS IRRIG 1000ML POUR BTL (IV SOLUTION) ×2 IMPLANT
PACK LAPAROSCOPY BASIN (CUSTOM PROCEDURE TRAY) ×2 IMPLANT
PACK TRENDGUARD 450 HYBRID PRO (MISCELLANEOUS) IMPLANT
PACK TRENDGUARD 600 HYBRD PROC (MISCELLANEOUS) ×1 IMPLANT
POUCH SPECIMEN RETRIEVAL 10MM (ENDOMECHANICALS) IMPLANT
PROTECTOR NERVE ULNAR (MISCELLANEOUS) ×4 IMPLANT
SET IRRIG TUBING LAPAROSCOPIC (IRRIGATION / IRRIGATOR) ×2 IMPLANT
SHEARS HARMONIC ACE PLUS 36CM (ENDOMECHANICALS) ×2 IMPLANT
SLEEVE XCEL OPT CAN 5 100 (ENDOMECHANICALS) ×2 IMPLANT
SUT VICRYL 0 UR6 27IN ABS (SUTURE) ×2 IMPLANT
SUT VICRYL 4-0 PS2 18IN ABS (SUTURE) ×2 IMPLANT
SYSTEM CARTER THOMASON II (TROCAR) ×2 IMPLANT
TOWEL OR 17X24 6PK STRL BLUE (TOWEL DISPOSABLE) ×4 IMPLANT
TRAY FOLEY BAG SILVER LF 16FR (CATHETERS) ×2 IMPLANT
TRENDGUARD 450 HYBRID PRO PACK (MISCELLANEOUS)
TRENDGUARD 600 HYBRID PROC PK (MISCELLANEOUS) ×2
TROCAR XCEL NON-BLD 11X100MML (ENDOMECHANICALS) ×2 IMPLANT
TROCAR XCEL NON-BLD 5MMX100MML (ENDOMECHANICALS) ×2 IMPLANT
WARMER LAPAROSCOPE (MISCELLANEOUS) ×2 IMPLANT

## 2016-12-19 NOTE — Anesthesia Preprocedure Evaluation (Signed)
Anesthesia Evaluation  Patient identified by MRN, date of birth, ID band Patient awake    Reviewed: Allergy & Precautions, NPO status   Airway Mallampati: II  TM Distance: >3 FB     Dental   Pulmonary    breath sounds clear to auscultation       Cardiovascular hypertension,  Rhythm:Regular Rate:Normal     Neuro/Psych  Neuromuscular disease    GI/Hepatic negative GI ROS, Neg liver ROS,   Endo/Other  negative endocrine ROS  Renal/GU negative Renal ROS     Musculoskeletal   Abdominal   Peds  Hematology   Anesthesia Other Findings   Reproductive/Obstetrics                             Anesthesia Physical Anesthesia Plan  ASA: III  Anesthesia Plan: General   Post-op Pain Management:    Induction: Intravenous  Airway Management Planned: Oral ETT  Additional Equipment:   Intra-op Plan:   Post-operative Plan: Extubation in OR  Informed Consent: I have reviewed the patients History and Physical, chart, labs and discussed the procedure including the risks, benefits and alternatives for the proposed anesthesia with the patient or authorized representative who has indicated his/her understanding and acceptance.   Dental advisory given  Plan Discussed with: CRNA and Anesthesiologist  Anesthesia Plan Comments:         Anesthesia Quick Evaluation

## 2016-12-19 NOTE — Interval H&P Note (Signed)
History and Physical Interval Note:  12/19/2016 9:06 AM  Courtney Meza  has presented today for surgery, with the diagnosis of ovarian cyst  The various methods of treatment have been discussed with the patient and family. After consideration of risks, benefits and other options for treatment, the patient has consented to  Procedure(s) with comments: LAPAROSCOPIC OVARIAN CYSTECTOMY (N/A) - WITH REMOVAL OF BILATERAL OVARIES  as a surgical intervention .  The patient's history has been reviewed, patient examined, no change in status, stable for surgery.  I have reviewed the patient's chart and labs.  Questions were answered to the patient's satisfaction.     Brogan Martis Bovard-Stuckert

## 2016-12-19 NOTE — Anesthesia Postprocedure Evaluation (Signed)
Anesthesia Post Note  Patient: Courtney Meza  Procedure(s) Performed: Procedure(s) (LRB): LAPAROSCOPIC LEFT OVARIAN CYSTECTOMY LYSIS OF ADHESIONS (Left)  Patient location during evaluation: PACU Anesthesia Type: General Level of consciousness: awake and alert Pain management: pain level controlled Vital Signs Assessment: post-procedure vital signs reviewed and stable Respiratory status: spontaneous breathing, nonlabored ventilation and respiratory function stable Cardiovascular status: blood pressure returned to baseline and stable Postop Assessment: no signs of nausea or vomiting Anesthetic complications: no        Last Vitals:  Vitals:   12/19/16 1215 12/19/16 1230  BP: 125/89 121/82  Pulse: 76 63  Resp: 18 17  Temp: 37.1 C     Last Pain:  Vitals:   12/19/16 0811  TempSrc: Oral   Pain Goal: Patients Stated Pain Goal: 3 (12/19/16 1115)               Lynda Rainwater

## 2016-12-19 NOTE — Interval H&P Note (Signed)
History and Physical Interval Note:  12/19/2016 9:06 AM  Courtney Meza  has presented today for surgery, with the diagnosis of ovarian cyst  The various methods of treatment have been discussed with the patient and family. After consideration of risks, benefits and other options for treatment, the patient has consented to  Procedure(s) with comments: LAPAROSCOPIC OVARIAN CYSTECTOMY (N/A) - WITH REMOVAL OF BILATERAL OVARIES  as a surgical intervention .  The patient's history has been reviewed, patient examined, no change in status, stable for surgery.  I have reviewed the patient's chart and labs.  Questions were answered to the patient's satisfaction.     Demetress Tift Bovard-Stuckert

## 2016-12-19 NOTE — Anesthesia Procedure Notes (Signed)
Procedure Name: Intubation Date/Time: 12/19/2016 9:43 AM Performed by: Belinda Block Pre-anesthesia Checklist: Patient identified, Patient being monitored, Timeout performed, Emergency Drugs available and Suction available Patient Re-evaluated:Patient Re-evaluated prior to inductionOxygen Delivery Method: Circle System Utilized Preoxygenation: Pre-oxygenation with 100% oxygen Intubation Type: IV induction Ventilation: Mask ventilation without difficulty Laryngoscope Size: Mac, Sabra Heck and 2 Grade View: Grade II Tube type: Oral Tube size: 7.0 mm Number of attempts: 1 Airway Equipment and Method: stylet and Oral airway Placement Confirmation: ETT inserted through vocal cords under direct vision,  positive ETCO2 and breath sounds checked- equal and bilateral Secured at: 22 cm Tube secured with: Tape Dental Injury: Teeth and Oropharynx as per pre-operative assessment

## 2016-12-19 NOTE — Transfer of Care (Signed)
Immediate Anesthesia Transfer of Care Note  Patient: Courtney Meza  Procedure(s) Performed: Procedure(s): LAPAROSCOPIC LEFT OVARIAN CYSTECTOMY LYSIS OF ADHESIONS (Left)  Patient Location: PACU  Anesthesia Type:General  Level of Consciousness: awake, alert  and oriented  Airway & Oxygen Therapy: Patient Spontanous Breathing and Patient connected to nasal cannula oxygen  Post-op Assessment: Report given to RN, Post -op Vital signs reviewed and stable and Patient moving all extremities X 4  Post vital signs: Reviewed and stable  Last Vitals:  Vitals:   12/19/16 0811 12/19/16 1115  BP: 126/85   Pulse: 76 89  Resp: 18 19  Temp: 36.8 C 37.2 C    Last Pain:  Vitals:   12/19/16 0811  TempSrc: Oral      Patients Stated Pain Goal: 3 ([id number])  Complications: No apparent anesthesia complications

## 2016-12-19 NOTE — Discharge Instructions (Signed)
DISCHARGE INSTRUCTIONS: Laparoscopy  The following instructions have been prepared to help you care for yourself upon your return home today.  Wound care:  Do not get the incision wet for the first 24 hours. The incision should be kept clean and dry.  The Band-Aids or dressings may be removed the day after surgery.  Should the incision become sore, red, and swollen after the first week, check with your doctor.  Personal hygiene:  Shower the day after your procedure.  Activity and limitations:  Do NOT drive or operate any equipment today.  Do NOT lift anything more than 10-15 pounds for 6 weeks after surgery.  Do NOT rest in bed all day.  Walking is encouraged. Walk each day, starting slowly with 5-minute walks 3 or 4 times a day. Slowly increase the length of your walks.  Walk up and down stairs slowly.  Do NOT do strenuous activities, such as golfing, playing tennis, bowling, running, biking, weight lifting, gardening, mowing, or vacuuming for 2-4 weeks. Ask your doctor when it is okay to start.  Diet: Eat a light meal as desired this evening. You may resume your usual diet tomorrow.  Return to work: This is dependent on the type of work you do. For the most part you can return to a desk job within a week of surgery. If you are more active at work, please discuss this with your doctor.  What to expect after your surgery: You may have a slight burning sensation when you urinate on the first day. You may have a very small amount of blood in the urine. Expect to have a small amount of vaginal discharge/light bleeding for 1-2 weeks. It is not unusual to have abdominal soreness and bruising for up to 2 weeks. You may be tired and need more rest for about 1 week. You may experience shoulder pain for 24-72 hours. Lying flat in bed may relieve it.  Call your doctor for any of the following:  Develop a fever of 100.4 or greater  Inability to urinate 6 hours after discharge from  hospital  Severe pain not relieved by pain medications  Persistent of heavy bleeding at incision site  Redness or swelling around incision site after a week  Increasing nausea or vomiting  Do not take Ibuprofen/Motrin/Advil until 5:00pm 12/19/16.  You may remove the patch behind your ear on or before Thursday 12/22/16.  Wash your hands with soap and water after any contact with the patch.

## 2016-12-19 NOTE — Brief Op Note (Signed)
12/19/2016  11:14 AM  PATIENT:  Courtney Meza  31 y.o. female  PRE-OPERATIVE DIAGNOSIS:  ovarian cyst  POST-OPERATIVE DIAGNOSIS:  OVARIAN CYST (left) s/p L cystectomy/L salpingectomy , LOA  PROCEDURE:  Procedure(s): LAPAROSCOPIC LEFT OVARIAN CYSTECTOMY LYSIS OF ADHESIONS (Left), Left salpingectomy  SURGEON:  Surgeon(s) and Role:    * Bovard-Stuckert, Trasean Delima, MD - Primary  ASSISTANTS: Banga, Cecilia DO   ANESTHESIA:   local and general  EBL:  Total I/O In: 1000 [I.V.:1000] Out: 200 [Urine:200], EBL 25cc  BLOOD ADMINISTERED:none  DRAINS: none   LOCAL MEDICATIONS USED:  MARCAINE     SPECIMEN:  Source of Specimen:  Left tube and tuboovarian cyst,   DISPOSITION OF SPECIMEN:  PATHOLOGY  COUNTS:  YES  TOURNIQUET:  * No tourniquets in log *  DICTATION: .Other Dictation: Dictation Number [id number]  PLAN OF CARE: Discharge to home after PACU  PATIENT DISPOSITION:  PACU - hemodynamically stable.   Delay start of Pharmacological VTE agent (>24hrs) due to surgical blood loss or risk of bleeding: yes

## 2016-12-20 ENCOUNTER — Encounter (HOSPITAL_COMMUNITY): Payer: Self-pay | Admitting: Obstetrics and Gynecology

## 2016-12-20 NOTE — Telephone Encounter (Signed)
I have spoken with pt. this afternoon to remind her it is time to repeat CBC.  She sts. she just had surgery--doesn't think she can come in this week.  Will come in on Monday 12/26/16.  Per RAS, this is ok/fim

## 2016-12-20 NOTE — Op Note (Signed)
Courtney Meza, [place]          ACCOUNT NO.:  [id number]  MEDICAL RECORD NO.:  [id number]  LOCATION:                                 FACILITY:  PHYSICIAN:  Thornell Sartorius, MD             DATE OF BIRTH:  DATE OF PROCEDURE:  12/19/2016 DATE OF DISCHARGE:                              OPERATIVE REPORT   PREOPERATIVE DIAGNOSIS:  Ovarian cyst.  POSTOPERATIVE DIAGNOSIS:  Left tubo-ovarian cyst, status post left cystectomy and left salingectomy and lysis of adhesions.  PROCEDURE:  Laparoscopic left tubo-ovarian cystectomy, lysis of adhesions, and left salpingectomy.  SURGEON:  Thornell Sartorius, MD.  ASSISTANTCarlynn Purl, DO.  ANESTHESIA:  Local and general.  ESTIMATED BLOOD LOSS:  Approximately 25 mL.  URINE OUTPUT:  200 mL clear urine at the end of the procedure.  IV FLUIDS:  1000 mL.  COMPLICATIONS:  None.  PATHOLOGY:  Left tube and 2 ovarian cysts.  DESCRIPTION OF PROCEDURE:  After informed consent was reviewed with the patient including risks, benefits, and alternatives of the surgical procedure, she was transported to the OR, placed on the table in supine position, then placed in the Y[place]in stirrups.  General anesthesia was induced and found to be adequate.  She was then prepped and draped in the normal sterile fashion.  A Foley catheter was sterilely placed as well as a Hulka manipulator after direct visualization of the cervix with an open-sided speculum, grasping it with single-tooth tenaculum. The gloves and gown were changed.  Attention was turned to the abdominal portion of the case.  Approximately 15 mm vertical infraumbilical incision was made.  The Veress needle was used to obtain pneumoperitoneum after passing the hanging drop test.  The trocar was placed under direct visualization.  A brief pelvic survey performed revealed a large left-appearing ovarian cyst in the posterior cul-de-sac as well as a pedunculated fibroid at the fundus and a tiny fibroid  on the posterior wall of the cervix.  The accessory ports were placed on both the left and a 10 mm was placed on the right.  These were placed under direct visualization.  The ovarian cyst was elevated and noted to be mostly free.  The tube was excised.  At this point, the cyst was noted to be separate, but attached to the ovary.  This pedicle was also lysed.  The several adhesions were also lysed to assist with removing the cyst.  The cyst was drained of approximately 120 mL of cloudy clear yellow green fluid.  It was then placed in an EndoCatch bag and removed from the abdomen.  A Carter-Thomason was used to close the port.  The other ports were closed with 3-0 Vicryl in subcuticular fashion, and the Dermabond was also applied.  At the end of the procedure, the manipulator was removed from vagina.  The patient tolerated the procedure well.  Sponge, lap, and needle counts were correct x2 per the operating room staff.     Thornell Sartorius, MD     JB/MEDQ  D:  12/19/2016  T:  12/20/2016  Job:  [id number]

## 2016-12-27 ENCOUNTER — Other Ambulatory Visit: Payer: Self-pay | Admitting: *Deleted

## 2016-12-27 ENCOUNTER — Other Ambulatory Visit (INDEPENDENT_AMBULATORY_CARE_PROVIDER_SITE_OTHER): Payer: Self-pay

## 2016-12-27 DIAGNOSIS — R899 Unspecified abnormal finding in specimens from other organs, systems and tissues: Secondary | ICD-10-CM

## 2016-12-27 DIAGNOSIS — G35 Multiple sclerosis: Secondary | ICD-10-CM

## 2016-12-27 DIAGNOSIS — Z0289 Encounter for other administrative examinations: Secondary | ICD-10-CM

## 2016-12-27 NOTE — Telephone Encounter (Signed)
I have spoken with Courtney Meza this morning and reminded her to come in this week for repeat CBC.  She sts. she will come in this morning/fim

## 2016-12-28 LAB — CBC WITH DIFFERENTIAL/PLATELET
BASOS ABS: 0 10*3/uL (ref 0.0–0.2)
Basos: 0 %
EOS (ABSOLUTE): 0.2 10*3/uL (ref 0.0–0.4)
Eos: 4 %
HEMOGLOBIN: 11.2 g/dL (ref 11.1–15.9)
Hematocrit: 35.6 % (ref 34.0–46.6)
IMMATURE GRANS (ABS): 0 10*3/uL (ref 0.0–0.1)
Immature Granulocytes: 0 %
LYMPHS: 30 %
Lymphocytes Absolute: 1.4 10*3/uL (ref 0.7–3.1)
MCH: 26.2 pg — AB (ref 26.6–33.0)
MCHC: 31.5 g/dL (ref 31.5–35.7)
MCV: 83 fL (ref 79–97)
MONOCYTES: 7 %
Monocytes Absolute: 0.3 10*3/uL (ref 0.1–0.9)
NEUTROS ABS: 2.8 10*3/uL (ref 1.4–7.0)
Neutrophils: 59 %
Platelets: 331 10*3/uL (ref 150–379)
RBC: 4.28 x10E6/uL (ref 3.77–5.28)
RDW: 15.4 % (ref 12.3–15.4)
WBC: 4.7 10*3/uL (ref 3.4–10.8)

## 2016-12-29 ENCOUNTER — Telehealth: Payer: Self-pay | Admitting: *Deleted

## 2016-12-29 NOTE — Telephone Encounter (Signed)
I have spoken with Courtney Meza this afternoon and per RAS, advised lab work is fine now--bd cts wnl.  She verbalized understanding of same/fim

## 2016-12-29 NOTE — Telephone Encounter (Signed)
-----   Message from Britt Bottom, MD sent at 12/29/2016 12:51 PM EDT ----- Please let the patient know that the lab work is fine now   --- blood counts are in normal range

## 2017-01-09 ENCOUNTER — Encounter: Payer: Self-pay | Admitting: Neurology

## 2017-01-09 ENCOUNTER — Ambulatory Visit (INDEPENDENT_AMBULATORY_CARE_PROVIDER_SITE_OTHER): Payer: BLUE CROSS/BLUE SHIELD | Admitting: Neurology

## 2017-01-09 VITALS — BP 136/89 | HR 71 | Resp 16 | Ht 64.0 in | Wt 268.5 lb

## 2017-01-09 DIAGNOSIS — R350 Frequency of micturition: Secondary | ICD-10-CM | POA: Diagnosis not present

## 2017-01-09 DIAGNOSIS — Z79899 Other long term (current) drug therapy: Secondary | ICD-10-CM | POA: Insufficient documentation

## 2017-01-09 DIAGNOSIS — Z8669 Personal history of other diseases of the nervous system and sense organs: Secondary | ICD-10-CM

## 2017-01-09 DIAGNOSIS — R202 Paresthesia of skin: Secondary | ICD-10-CM | POA: Diagnosis not present

## 2017-01-09 DIAGNOSIS — R7989 Other specified abnormal findings of blood chemistry: Secondary | ICD-10-CM

## 2017-01-09 DIAGNOSIS — E559 Vitamin D deficiency, unspecified: Secondary | ICD-10-CM | POA: Diagnosis not present

## 2017-01-09 DIAGNOSIS — G35 Multiple sclerosis: Secondary | ICD-10-CM | POA: Diagnosis not present

## 2017-01-09 DIAGNOSIS — R200 Anesthesia of skin: Secondary | ICD-10-CM | POA: Diagnosis not present

## 2017-01-09 NOTE — Progress Notes (Signed)
[facility]  PATIENT: Courtney Meza DOB: 06/06/1986  REFERRING DOCTOR OR PCP:  Concha Norway (referred by) is Dr. Melba Coon;  PCP is [place] Chard SOURCE: Patient, notes from Dr. Melba Coon, [place] and lab reports, MRI images 3 onPACS.  _________________________________   HISTORICAL  CHIEF COMPLAINT:  Chief Complaint  Patient presents with  . Multiple Sclerosis    Sts. she is tolerationg Tecfidera with occasional flushing.  Feels left arm remains weaker than right./fim    HISTORY OF PRESENT ILLNESS:   Courtney Meza is a 31 y.o. woman with MS diagnosed early 2018.      MS:   She started Tecfidera 09/2016 and is tolerating it well.   She has not had any exacerbations.    She did note mild left arm symptoms (slightly weaker) that started after the last visit (but before Tecfidera was started) but it improved some a few weeks later.    A month ago we checked a blood count and her WBC was reduced and the neutrophil count was reduced at 0.7. We repeated it 2 weeks ago and they white blood cell count and neutrophils are back to normal.  Gait/strength/sensatrion:   She feels her gait does well. She denies any numbness or weakness in her legs. She notes numbness in the left arm since last year that became worse in March 2018 but then returned back to baseline.  Bladder:  She reports some urinary urgency and frequency. She denies incontinence. She has nocturia couple times a night.    Vision:   She notes mild reduced vision out of the right eye. She denies any changes in color vision.   Fatigue/sleep:   She has noted fatigue over the past 2 years. She is uncertain if this is due to her work as she works night shifts. She sleeps well some nights but has some troule falling asleep during the day.  She does not snore.  Mood/Cognition   She denies any difficulty with mood or cognition.  Low Vit D.  She had low vitamin D in the past and was on 50,000 units weekly for a while and now  takes over-the-counter supplements.  MS History:   In April 2006, while driving home she had the sudden onset of reduced right vision like she was looking through a heavy fog.     Vision improved over the next year but not to baseline and has been stable since..   Later in 2006, she had the onset of right facial weakness.  She had two weeks of facial twitching and then had facial weakness that seemed to develop over minutes to one hour.  She was told she had Bell's palsy.  She is uncertain whether she had complete loss of strength in the upper face.  She had no other major symptoms over the next decade.   However, last year she had numbness in the left arm over a couple weeks The 2006 MRI showed pituitary enlargement.   Prolactin levels were recently elevated and she had a follow-up MRI showing stable pituitary enlargement and a pars intermedius cyst.   On the 09/23/2016 MRI, she had multiple white matter lesions, periventricular > juxtacortical, a left midbrain and right pons lesion.  Brain lesions were not present on the previous MRI.    MS History:   In April 2006, while driving home she had the sudden onset of reduced right vision like she was looking through a heavy fog.     Vision improved over the  next year but not to baseline and has been stable since..   Later in 2006, she had the onset of right facial weakness.  She had two weeks of facial twitching and then had facial weakness that seemed to develop over minutes to one hour.  She was told she had Bell's palsy.  She is uncertain whether she had complete loss of strength in the upper face.  She had no other major symptoms over the next decade.   However, last year she had numbness in the left arm over a couple weeks The 2006 MRI showed pituitary enlargement.   Prolactin levels were recently elevated and she had a follow-up MRI showing stable pituitary enlargement and a pars intermedius cyst.   On the 09/23/2016 MRI, she had multiple white matter  lesions, periventricular > juxtacortical, a left midbrain and right pons lesion.  Brain lesions were not present on the previous MRI.    Data: The MRI of the brain in October 30, 2004 did not show any brain lesions.   However, the second MRI performed 11/07/2004 appeared to show diffusion weighted hyperintensity in the right optic nerve as well as enhancement of the right optic nervenot present on the original 10/30/2004 MRI.    The MRI from 09/23/2016 shows multiple T2/FLAIR hyperintense foci in the hemispheres, predominantly in the periventricular white matter but also in the juxtacortical white matter. Additionally there are left midbrain and right pontine lesions.   There is questionable optic nerve enhancement.   REVIEW OF SYSTEMS: Constitutional: No fevers, chills, sweats, or change in appetite.   She notes some fatigue. She sometimes has trouble with sleep. Eyes: as above Ear, nose and throat: No hearing loss, ear pain, nasal congestion, sore throat Cardiovascular: No chest pain, palpitations Respiratory: No shortness of breath at rest or with exertion.   No wheezes GastrointestinaI: No nausea, vomiting, diarrhea, abdominal pain, fecal incontinence Genitourinary: No dysuria, urinary retention or frequency.  No nocturia. Musculoskeletal: No neck pain, back pain Integumentary: No rash, pruritus, skin lesions Neurological: as above Psychiatric: No depression at this time.  No anxiety Endocrine: No palpitations, diaphoresis, change in appetite, change in weigh or increased thirst Hematologic/Lymphatic: No anemia, purpura, petechiae. Allergic/Immunologic: No itchy/runny eyes, nasal congestion, recent allergic reactions, rashes  ALLERGIES: Allergies  Allergen Reactions  . Latex Hives    HOME MEDICATIONS:  Current Outpatient Prescriptions:  .  Cholecalciferol (VITAMIN D3) 5000 units TABS, Take 1 tablet by mouth daily., Disp: , Rfl:  .  Dimethyl Fumarate 240 MG CPDR, Take 1 capsule  (240 mg total) by mouth 2 (two) times daily., Disp: 180 capsule, Rfl: 3 .  hydrochlorothiazide (MICROZIDE) 12.5 MG capsule, Take 12.5 mg by mouth daily., Disp: , Rfl:  .  nebivolol (BYSTOLIC) 5 MG tablet, Take 5 mg by mouth daily., Disp: , Rfl:  .  Prenatal Multivit-Min-Fe-FA (PRENATAL VITAMINS PO), Take 1 tablet by mouth daily. , Disp: , Rfl:   PAST MEDICAL HISTORY: Past Medical History:  Diagnosis Date  . Hypertension   . Multiple sclerosis ([place])   . Neuromuscular disorder ([place])   . Ovarian cyst, left 12/19/2016  . Vision abnormalities     PAST SURGICAL HISTORY: Past Surgical History:  Procedure Laterality Date  . LAPAROSCOPIC OVARIAN CYSTECTOMY Left 12/19/2016   Procedure: LAPAROSCOPIC LEFT OVARIAN CYSTECTOMY LYSIS OF ADHESIONS;  Surgeon: Janyth Contes, MD;  Location: [facility];  Service: Gynecology;  Laterality: Left;  . WISDOM TOOTH EXTRACTION      FAMILY HISTORY: Family History  Problem  Relation Age of Onset  . Diabetes Mother   . Hypertension Mother   . Diabetes Maternal Grandmother   . Hypertension Maternal Grandmother   . Liver cancer Maternal Grandfather     SOCIAL HISTORY:  Social History   Social History  . Marital status: Married    Spouse name: N/A  . Number of children: N/A  . Years of education: N/A   Occupational History  . Not on file.   Social History Main Topics  . Smoking status: Never Smoker  . Smokeless tobacco: Never Used  . Alcohol use No  . Drug use: No  . Sexual activity: Not on file   Other Topics Concern  . Not on file   Social History Narrative  . No narrative on file     PHYSICAL EXAM  Vitals:   01/09/17 1115  BP: 136/89  Pulse: 71  Resp: 16  Weight: 268 lb 8 oz (121.8 kg)  Height: 5\' 4"  (1.626 m)    Body mass index is 46.09 kg/m.   General: The patient is well-developed and well-nourished and in no acute distress  Neurologic Exam  Mental status: The patient is alert and oriented x 3 at the time of the  examination. The patient has apparent normal recent and remote memory, with an apparently normal attention span and concentration ability.   Speech is normal.  Cranial nerves: Extraocular movements are full. Visual acuity is reduced on the right.   Has a 1+ right afferent pupillary defect. Pupils equally reactive to accomodation.    She reports symmetric color vision  patient's strength and sensation is normal. Trapezius and sternocleidomastoid strength is normal. No dysarthria is noted.  The tongue is midline, and the patient has symmetric elevation of the soft palate. No obvious hearing deficits are noted.  Motor:  Muscle bulk is normal.   Tone is normal. Strength is  5 / 5 in all 4 extremities.   Sensory: Sensory testing is intact to pinprick, soft touch and vibration sensation in all 4 extremities.  Coordination: Cerebellar testing shows good finger-nose-finger bilaterally.  Gait and station: Station is normal.   Her gait is normal. Tandem gait is normal. Romberg is negative.  Reflexes: Deep tendon reflexes are normal and symmetric in the arms. She has spread at the left knee.       DIAGNOSTIC DATA (LABS, IMAGING, TESTING) - I reviewed patient records, labs, notes, testing and imaging myself where available.      ASSESSMENT AND PLAN  Multiple sclerosis ([place])  History of optic neuritis  Urinary frequency  Low vitamin D level  High risk medication use  Numbness and tingling in left arm   1.   Continue Tecfidera. I will see her back in 4-5 months and we will recheck a blood count at that point.  2.    If urinary frequency or nocturia or worsens, we could consider Ditropan or similar medication.  3.    Continue vitamin D supplementation.  4.    She will return to see me in 5 months or sooner if there are new or worsening neurologic symptoms.     Marthann Abshier A. Felecia Shelling, MD, PhD 6/46/8032, 12:24 AM Certified in Neurology, Clinical Neurophysiology, Sleep Medicine, Pain Medicine  and Neuroimaging  [facility] [address], [place], [place] [address] [phone]

## 2017-05-01 ENCOUNTER — Telehealth: Payer: Self-pay | Admitting: Neurology

## 2017-05-01 NOTE — Telephone Encounter (Signed)
I have spoken with Courtney Meza this afternoon.  She c/o occasional flushing 45 min. after taking Tecfidera. Lasts about 30 min.  Onset 2 mos. ago.  Same since onset, not getting worse.  No other sx. Tolerable at this time.  She will call back if side effect worsens/becomes intolerable, or if other side effects present/fim

## 2017-05-01 NOTE — Telephone Encounter (Signed)
Pt states her pharmacy advised her to contact office re: a side effect she is having to the Ken Caryl.  Pt having a burning sensation in her brian.  Usually happens 30-45 mins. After taking medication.  Pt states this has been happening for a couple of months.  She states it usually resolves within 30 ins. Please call

## 2017-06-07 ENCOUNTER — Encounter: Payer: Self-pay | Admitting: Neurology

## 2017-06-07 ENCOUNTER — Ambulatory Visit: Payer: BLUE CROSS/BLUE SHIELD | Admitting: Neurology

## 2017-06-07 VITALS — BP 140/94 | HR 88 | Resp 16 | Ht 64.0 in | Wt 277.5 lb

## 2017-06-07 DIAGNOSIS — G4726 Circadian rhythm sleep disorder, shift work type: Secondary | ICD-10-CM | POA: Insufficient documentation

## 2017-06-07 DIAGNOSIS — R350 Frequency of micturition: Secondary | ICD-10-CM | POA: Diagnosis not present

## 2017-06-07 DIAGNOSIS — G4719 Other hypersomnia: Secondary | ICD-10-CM | POA: Diagnosis not present

## 2017-06-07 DIAGNOSIS — G35 Multiple sclerosis: Secondary | ICD-10-CM

## 2017-06-07 DIAGNOSIS — R202 Paresthesia of skin: Secondary | ICD-10-CM | POA: Diagnosis not present

## 2017-06-07 DIAGNOSIS — Z8669 Personal history of other diseases of the nervous system and sense organs: Secondary | ICD-10-CM

## 2017-06-07 DIAGNOSIS — Z79899 Other long term (current) drug therapy: Secondary | ICD-10-CM

## 2017-06-07 DIAGNOSIS — R200 Anesthesia of skin: Secondary | ICD-10-CM | POA: Diagnosis not present

## 2017-06-07 MED ORDER — ARMODAFINIL 250 MG PO TABS: ORAL_TABLET | ORAL | 5 refills | Status: DC

## 2017-06-07 NOTE — Progress Notes (Signed)
[facility]  PATIENT: Courtney Meza DOB: [birth year]  REFERRING DOCTOR OR PCP:  Concha Norway (referred by) is Dr. Melba Coon;  PCP is Glendale Chard SOURCE: Patient, notes from Dr. Melba Coon, [place] and lab reports, MRI images 3 onPACS.  _________________________________   HISTORICAL  CHIEF COMPLAINT:  Chief Complaint  Patient presents with  . Multiple Sclerosis    Sts. she is tolerating Tecfidera with occasional brief flushing.  Sts. she continues to have intermittent numbness, tingling , jerking left arm, also progressive weakness of left arm/fim    HISTORY OF PRESENT ILLNESS:   Courtney Meza is a 31 y.o. woman with MS diagnosed early 2018.      Update 06/07/2017: She feels her MS is mostly stable but she has had a couple episodes of 24-36 hours of left hand tingling and numbness with some 'jumping'.  . She is on Tecfidera since March 2018. She tolerates it well. Occasionally she will have some flushing but this is not a problem. Denies GI issues. She had one blood test where the white blood cells were low and the neutrophils were also low at 0.7 but a repeat test a month later was fine. Her last lymphocyte count was 1.4.    Gait is doing well.   She notes mild stable left arm weakness.  She has no numbness now in hands or legs.  Bladder is fine.  Vision is worse on her right but unchanged.  Colors are desaturated on the right.   She also had optic neuritis in 2006 (but MRI had no white matter foci at that time).  She notes more fatigue.  Of note, she works night shifts (x last 13 years)   She sleeps well but usually needs a long nap daily.  She sleeps 11 hours on her off days.    She denies depression or anxiety.   Her cognition is fine.    ___________________________________ From 01/09/2017: MS:   She started Tecfidera 09/2016 and is tolerating it well.   She has not had any exacerbations.    She did note mild left arm symptoms (slightly weaker) that started after the last  visit (but before Tecfidera was started) but it improved some a few weeks later.    A month ago we checked a blood count and her WBC was reduced and the neutrophil count was reduced at 0.7. We repeated it 2 weeks ago and they white blood cell count and neutrophils are back to normal.  Gait/strength/sensatrion:   She feels her gait does well. She denies any numbness or weakness in her legs. She notes numbness in the left arm since last year that became worse in March 2018 but then returned back to baseline.  Bladder:  She reports some urinary urgency and frequency. She denies incontinence. She has nocturia couple times a night.    Vision:   She notes mild reduced vision out of the right eye. She denies any changes in color vision.   Fatigue/sleep:   She has noted fatigue over the past 2 years. She is uncertain if this is due to her work as she works night shifts. She sleeps well some nights but has some troule falling asleep during the day.  She does not snore.  Mood/Cognition   She denies any difficulty with mood or cognition.  Low Vit D.  She had low vitamin D in the past and was on 50,000 units weekly for a while and now takes over-the-counter supplements.  MS History:  In April 2006, while driving home she had the sudden onset of reduced right vision like she was looking through a heavy fog.     Vision improved over the next year but not to baseline and has been stable since..   Later in 2006, she had the onset of right facial weakness.  She had two weeks of facial twitching and then had facial weakness that seemed to develop over minutes to one hour.  She was told she had Bell's palsy.  She is uncertain whether she had complete loss of strength in the upper face.  She had no other major symptoms over the next decade.   However, last year she had numbness in the left arm over a couple weeks The 2006 MRI showed pituitary enlargement.   Prolactin levels were recently elevated and she had a follow-up  MRI showing stable pituitary enlargement and a pars intermedius cyst.   On the 09/23/2016 MRI, she had multiple white matter lesions, periventricular > juxtacortical, a left midbrain and right pons lesion.  Brain lesions were not present on the previous MRI.    MS History:   In April 2006, while driving home she had the sudden onset of reduced right vision like she was looking through a heavy fog.     Vision improved over the next year but not to baseline and has been stable since..   Later in 2006, she had the onset of right facial weakness.  She had two weeks of facial twitching and then had facial weakness that seemed to develop over minutes to one hour.  She was told she had Bell's palsy.  She is uncertain whether she had complete loss of strength in the upper face.  She had no other major symptoms over the next decade.   However, last year she had numbness in the left arm over a couple weeks The 2006 MRI showed pituitary enlargement.   Prolactin levels were recently elevated and she had a follow-up MRI showing stable pituitary enlargement and a pars intermedius cyst.   On the 09/23/2016 MRI, she had multiple white matter lesions, periventricular > juxtacortical, a left midbrain and right pons lesion.  Brain lesions were not present on the previous MRI.    Data: The MRI of the brain in October 30, 2004 did not show any brain lesions.   However, the second MRI performed 11/07/2004 appeared to show diffusion weighted hyperintensity in the right optic nerve as well as enhancement of the right optic nervenot present on the original 10/30/2004 MRI.    The MRI from 09/23/2016 shows multiple T2/FLAIR hyperintense foci in the hemispheres, predominantly in the periventricular white matter but also in the juxtacortical white matter. Additionally there are left midbrain and right pontine lesions.   There is questionable optic nerve enhancement.   REVIEW OF SYSTEMS: Constitutional: No fevers, chills, sweats, or change  in appetite.   She notes some fatigue. She sometimes has trouble with sleep. Eyes: as above Ear, nose and throat: No hearing loss, ear pain, nasal congestion, sore throat Cardiovascular: No chest pain, palpitations Respiratory: No shortness of breath at rest or with exertion.   No wheezes GastrointestinaI: No nausea, vomiting, diarrhea, abdominal pain, fecal incontinence Genitourinary: No dysuria, urinary retention or frequency.  No nocturia. Musculoskeletal: No neck pain, back pain Integumentary: No rash, pruritus, skin lesions Neurological: as above Psychiatric: No depression at this time.  No anxiety Endocrine: No palpitations, diaphoresis, change in appetite, change in weigh or increased thirst Hematologic/Lymphatic: No  anemia, purpura, petechiae. Allergic/Immunologic: No itchy/runny eyes, nasal congestion, recent allergic reactions, rashes  ALLERGIES: Allergies  Allergen Reactions  . Latex Hives    HOME MEDICATIONS:  Current Outpatient Medications:  .  Cholecalciferol (VITAMIN D3) 5000 units TABS, Take 1 tablet by mouth daily., Disp: , Rfl:  .  Dimethyl Fumarate 240 MG CPDR, Take 1 capsule (240 mg total) by mouth 2 (two) times daily., Disp: 180 capsule, Rfl: 3 .  hydrochlorothiazide (MICROZIDE) 12.5 MG capsule, Take 12.5 mg by mouth daily., Disp: , Rfl:  .  nebivolol (BYSTOLIC) 5 MG tablet, Take 5 mg by mouth daily., Disp: , Rfl:  .  Prenatal Multivit-Min-Fe-FA (PRENATAL VITAMINS PO), Take 1 tablet by mouth daily. , Disp: , Rfl:  .  Armodafinil 250 MG tablet, Take 1/2 to 1 pill po every morning, Disp: 30 tablet, Rfl: 5  PAST MEDICAL HISTORY: Past Medical History:  Diagnosis Date  . Hypertension   . Multiple sclerosis ([place])   . Neuromuscular disorder ([place])   . Ovarian cyst, left 12/19/2016  . Vision abnormalities     PAST SURGICAL HISTORY: Past Surgical History:  Procedure Laterality Date  . WISDOM TOOTH EXTRACTION      FAMILY HISTORY: Family History  Problem  Relation Age of Onset  . Diabetes Mother   . Hypertension Mother   . Diabetes Maternal Grandmother   . Hypertension Maternal Grandmother   . Liver cancer Maternal Grandfather     SOCIAL HISTORY:  Social History   Socioeconomic History  . Marital status: Married    Spouse name: Not on file  . Number of children: Not on file  . Years of education: Not on file  . Highest education level: Not on file  Social Needs  . Financial resource strain: Not on file  . Food insecurity - worry: Not on file  . Food insecurity - inability: Not on file  . Transportation needs - medical: Not on file  . Transportation needs - non-medical: Not on file  Occupational History  . Not on file  Tobacco Use  . Smoking status: Never Smoker  . Smokeless tobacco: Never Used  Substance and Sexual Activity  . Alcohol use: No  . Drug use: No  . Sexual activity: Not on file  Other Topics Concern  . Not on file  Social History Narrative  . Not on file     PHYSICAL EXAM  Vitals:   06/07/17 1342  BP: (!) 140/94  Pulse: 88  Resp: 16  Weight: 277 lb 8 oz (125.9 kg)  Height: 5\' 4"  (1.626 m)    Body mass index is 47.63 kg/m.   General: The patient is well-developed and well-nourished and in no acute distress  Neurologic Exam  Mental status: The patient is alert and oriented x 3 at the time of the examination. The patient has apparent normal recent and remote memory, with an apparently normal attention span and concentration ability.   Speech is normal.  Cranial nerves: Extraocular movements are full. Visual acuity is reduced on the right.   Has a 2+ right afferent pupillary defect.   She reports decreased right color vision   Facial strength and sensation are fine. . Trapezius and sternocleidomastoid strength is normal. No dysarthria is noted.  The tongue is midline, and the patient has symmetric elevation of the soft palate. No obvious hearing deficits are noted.  Motor:  Muscle bulk is normal.    Tone is normal. Strength is  5 / 5 in all 4  extremities.   Sensory: Sensory testing is intact to touch and temperature and vibration in the arms and legs..  Coordination: Cerebellar testing shows good finger-nose-finger bilaterally.  Gait and station: Station is normal.   Her gait is normal. Tandem gait is normal. Romberg is negative.  Reflexes: Deep tendon reflexes are increased on the left.. She has spread at the left knee.       DIAGNOSTIC DATA (LABS, IMAGING, TESTING) - I reviewed patient records, labs, notes, testing and imaging myself where available.      ASSESSMENT AND PLAN  Multiple sclerosis ([place]) - Plan: CBC with Differential/Platelet  History of optic neuritis  Urinary frequency  High risk medication use - Plan: CBC with Differential/Platelet  Numbness and tingling in left arm  Shift work sleep disorder  Excessive daytime sleepiness   1.   Continue Tecfidera. We will recheck a CBC today to make sure that she does not have a lymphopenia or other cytopenias. Around the time of the next visit we will check an MRI of the brain to make sure that she is not having subclinical progression. If this is occurring or if any relapses occur, consider a change to different medicine. 2.   Nuvigil after she wakes up   3.    Continue vitamin D supplementation.  4.    She will return to see me in 5-6 months or sooner if there are new or worsening neurologic symptoms.     Clevester Helzer A. Felecia Shelling, MD, PhD 04/02/3299, 7:62 PM Certified in Neurology, Clinical Neurophysiology, Sleep Medicine, Pain Medicine and Neuroimaging  [facility] [address] [place], [place] [address] [phone]

## 2017-06-08 ENCOUNTER — Telehealth: Payer: Self-pay

## 2017-06-08 LAB — CBC WITH DIFFERENTIAL/PLATELET
Basophils Absolute: 0 10*3/uL (ref 0.0–0.2)
Basos: 0 %
EOS (ABSOLUTE): 0.1 10*3/uL (ref 0.0–0.4)
Eos: 3 %
HEMATOCRIT: 37.1 % (ref 34.0–46.6)
Hemoglobin: 12 g/dL (ref 11.1–15.9)
Immature Grans (Abs): 0 10*3/uL (ref 0.0–0.1)
Immature Granulocytes: 0 %
LYMPHS ABS: 1.2 10*3/uL (ref 0.7–3.1)
Lymphs: 27 %
MCH: 26.5 pg — AB (ref 26.6–33.0)
MCHC: 32.3 g/dL (ref 31.5–35.7)
MCV: 82 fL (ref 79–97)
MONOS ABS: 0.5 10*3/uL (ref 0.1–0.9)
Monocytes: 10 %
Neutrophils Absolute: 2.8 10*3/uL (ref 1.4–7.0)
Neutrophils: 60 %
Platelets: 320 10*3/uL (ref 150–379)
RBC: 4.52 x10E6/uL (ref 3.77–5.28)
RDW: 14.5 % (ref 12.3–15.4)
WBC: 4.6 10*3/uL (ref 3.4–10.8)

## 2017-06-08 NOTE — Telephone Encounter (Signed)
I left a detailed message with lab results, ok per dpr. I advised patient to call back with any questions or concerns.

## 2017-06-08 NOTE — Telephone Encounter (Signed)
-----   Message from Britt Bottom, MD sent at 06/08/2017 10:24 AM EST ----- Please let the patient know that the lab work is fine.

## 2017-06-12 ENCOUNTER — Ambulatory Visit: Payer: BLUE CROSS/BLUE SHIELD | Admitting: Neurology

## 2017-08-03 ENCOUNTER — Telehealth: Payer: Self-pay | Admitting: Neurology

## 2017-08-03 NOTE — Telephone Encounter (Signed)
Pt called she tecfidera needs PA thru [employer]

## 2017-08-04 NOTE — Telephone Encounter (Signed)
PA for Tecfidera 240mg  #60/30 completed via [employer].  Dx: RRMS (G35).  No tried and failed meds.  Her MS has been stable on Tecfidera since dx. 10/03/16/fim

## 2017-10-18 ENCOUNTER — Telehealth: Payer: Self-pay | Admitting: Neurology

## 2017-10-18 NOTE — Telephone Encounter (Signed)
Pt called stating she will be starting a new job but employers will need a letter from Dr. Felecia Shelling stating the pt is cleared to work.

## 2017-10-19 ENCOUNTER — Encounter: Payer: Self-pay | Admitting: *Deleted

## 2017-10-19 NOTE — Telephone Encounter (Signed)
Spoke with [place].  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 Felecia Shelling stating she is cleared to work full time hours in this position.  She reports continued compliance and tolerance of Tecfidera and denies any difficulty with gait/balance/falls. No cognitive deficits.  Note up front [employer] and she will pick it up/fim

## 2017-11-07 ENCOUNTER — Ambulatory Visit: Payer: BLUE CROSS/BLUE SHIELD | Admitting: Neurology

## 2017-11-07 ENCOUNTER — Other Ambulatory Visit: Payer: Self-pay

## 2017-11-07 ENCOUNTER — Encounter: Payer: Self-pay | Admitting: Neurology

## 2017-11-07 VITALS — BP 138/95 | HR 79 | Resp 18 | Ht 64.0 in | Wt 274.0 lb

## 2017-11-07 DIAGNOSIS — R202 Paresthesia of skin: Secondary | ICD-10-CM

## 2017-11-07 DIAGNOSIS — G4726 Circadian rhythm sleep disorder, shift work type: Secondary | ICD-10-CM | POA: Diagnosis not present

## 2017-11-07 DIAGNOSIS — G35 Multiple sclerosis: Secondary | ICD-10-CM

## 2017-11-07 DIAGNOSIS — G4719 Other hypersomnia: Secondary | ICD-10-CM

## 2017-11-07 DIAGNOSIS — Z8669 Personal history of other diseases of the nervous system and sense organs: Secondary | ICD-10-CM | POA: Diagnosis not present

## 2017-11-07 DIAGNOSIS — R200 Anesthesia of skin: Secondary | ICD-10-CM | POA: Diagnosis not present

## 2017-11-07 DIAGNOSIS — Z79899 Other long term (current) drug therapy: Secondary | ICD-10-CM | POA: Diagnosis not present

## 2017-11-07 MED ORDER — ARMODAFINIL 250 MG PO TABS: ORAL_TABLET | ORAL | 1 refills | Status: DC

## 2017-11-07 NOTE — Progress Notes (Signed)
[facility]  PATIENT: Courtney Meza DOB: 11-30-1985  REFERRING DOCTOR OR PCP:  Concha Norway (referred by) is Dr. Melba Coon;  PCP is Glendale Chard SOURCE: Patient, notes from Dr. Melba Coon, [place] and lab reports, MRI images 3 onPACS.  _________________________________   HISTORICAL  CHIEF COMPLAINT:  Chief Complaint  Patient presents with  . Multiple Sclerosis    32 y.o. woman with MS continues to tolerate Tecfidera with mild flushing.  Believes she is haivng some left arm weakness, heavy sensation, which is new since starting Tecfidera. "I find myself putting my hand in my pocket a lot, because it just feels heavy."/fim    HISTORY OF PRESENT ILLNESS:  Courtney Meza is a 32 y.o. woman with MS diagnosed early 2018.      Update 11/07/2017: She is noting some left arm weakness and heaviness seems worse since starting Tecfidera.   There is also some tingling.    There have not been any definite exacerbations since her last visit.  She generally tolerates Tecfidera well but will have mild flushing at times.    She notes no change in gait.    She notes no change in bladder function.     Balance is fine.   Vision is stable with poor right acuity and color vision since optic neuritis in 2006.     She is noting a lot of fatigue.   She works nights and shifts her schedule on days off.    She often needs a nap.  Armodafinil helps her daytime sleepiness.     She has some anxiety but not depression.    Sometimes she feels her chest gets tight associated with nervousness.    If distracted, she feels better.    Update 06/07/2017: She feels her MS is mostly stable but she has had a couple episodes of 24-36 hours of left hand tingling and numbness with some 'jumping'.  . She is on Tecfidera since March 2018. She tolerates it well. Occasionally she will have some flushing but this is not a problem. Denies GI issues. She had one blood test where the white blood cells were low and the neutrophils were also  low at 0.7 but a repeat test a month later was fine. Her last lymphocyte count was 1.4.    Gait is doing well.   She notes mild stable left arm weakness.  She has no numbness now in hands or legs.  Bladder is fine.  Vision is worse on her right but unchanged.  Colors are desaturated on the right.   She also had optic neuritis in 2006 (but MRI had no white matter foci at that time).  She notes more fatigue.  Of note, she works night shifts (x last 13 years)   She sleeps well but usually needs a long nap daily.  She sleeps 11 hours on her off days.    She denies depression or anxiety.   Her cognition is fine.    ___________________________________ From 01/09/2017: MS:   She started Tecfidera 09/2016 and is tolerating it well.   She has not had any exacerbations.    She did note mild left arm symptoms (slightly weaker) that started after the last visit (but before Tecfidera was started) but it improved some a few weeks later.    A month ago we checked a blood count and her WBC was reduced and the neutrophil count was reduced at 0.7. We repeated it 2 weeks ago and they white blood cell count and neutrophils are back  to normal.  Gait/strength/sensatrion:   She feels her gait does well. She denies any numbness or weakness in her legs. She notes numbness in the left arm since last year that became worse in March 2018 but then returned back to baseline.  Bladder:  She reports some urinary urgency and frequency. She denies incontinence. She has nocturia couple times a night.    Vision:   She notes mild reduced vision out of the right eye. She denies any changes in color vision.   Fatigue/sleep:   She has noted fatigue over the past 2 years. She is uncertain if this is due to her work as she works night shifts. She sleeps well some nights but has some troule falling asleep during the day.  She does not snore.  Mood/Cognition   She denies any difficulty with mood or cognition.  Low Vit D.  She had low vitamin  D in the past and was on 50,000 units weekly for a while and now takes over-the-counter supplements.  MS History:   In April 2006, while driving home she had the sudden onset of reduced right vision like she was looking through a heavy fog.     Vision improved over the next year but not to baseline and has been stable since..   Later in 2006, she had the onset of right facial weakness.  She had two weeks of facial twitching and then had facial weakness that seemed to develop over minutes to one hour.  She was told she had Bell's palsy.  She is uncertain whether she had complete loss of strength in the upper face.  She had no other major symptoms over the next decade.   However, last year she had numbness in the left arm over a couple weeks The 2006 MRI showed pituitary enlargement.   Prolactin levels were recently elevated and she had a follow-up MRI showing stable pituitary enlargement and a pars intermedius cyst.   On the 09/23/2016 MRI, she had multiple white matter lesions, periventricular > juxtacortical, a left midbrain and right pons lesion.  Brain lesions were not present on the previous MRI.    MS History:   In April 2006, while driving home she had the sudden onset of reduced right vision like she was looking through a heavy fog.     Vision improved over the next year but not to baseline and has been stable since..   Later in 2006, she had the onset of right facial weakness.  She had two weeks of facial twitching and then had facial weakness that seemed to develop over minutes to one hour.  She was told she had Bell's palsy.  She is uncertain whether she had complete loss of strength in the upper face.  She had no other major symptoms over the next decade.   However, last year she had numbness in the left arm over a couple weeks The 2006 MRI showed pituitary enlargement.   Prolactin levels were recently elevated and she had a follow-up MRI showing stable pituitary enlargement and a pars intermedius  cyst.   On the 09/23/2016 MRI, she had multiple white matter lesions, periventricular > juxtacortical, a left midbrain and right pons lesion.  Brain lesions were not present on the previous MRI.    Data: The MRI of the brain in October 30, 2004 did not show any brain lesions.   However, the second MRI performed 11/07/2004 appeared to show diffusion weighted hyperintensity in the right optic nerve as  well as enhancement of the right optic nervenot present on the original 10/30/2004 MRI.    The MRI from 09/23/2016 shows multiple T2/FLAIR hyperintense foci in the hemispheres, predominantly in the periventricular white matter but also in the juxtacortical white matter. Additionally there are left midbrain and right pontine lesions.   There is questionable optic nerve enhancement.   REVIEW OF SYSTEMS: Constitutional: No fevers, chills, sweats, or change in appetite.   She notes some fatigue. She sometimes has trouble with sleep. Eyes: as above Ear, nose and throat: No hearing loss, ear pain, nasal congestion, sore throat Cardiovascular: No chest pain, palpitations Respiratory: No shortness of breath at rest or with exertion.   No wheezes GastrointestinaI: No nausea, vomiting, diarrhea, abdominal pain, fecal incontinence Genitourinary: No dysuria, urinary retention or frequency.  No nocturia. Musculoskeletal: No neck pain, back pain Integumentary: No rash, pruritus, skin lesions Neurological: as above Psychiatric: No depression at this time.  No anxiety Endocrine: No palpitations, diaphoresis, change in appetite, change in weigh or increased thirst Hematologic/Lymphatic: No anemia, purpura, petechiae. Allergic/Immunologic: No itchy/runny eyes, nasal congestion, recent allergic reactions, rashes  ALLERGIES: Allergies  Allergen Reactions  . Latex Hives    HOME MEDICATIONS:  Current Outpatient Medications:  .  Armodafinil 250 MG tablet, Take 1/2 to 1 pill po every morning, Disp: 90 tablet, Rfl:  1 .  Cholecalciferol (VITAMIN D3) 5000 units TABS, Take 1 tablet by mouth daily., Disp: , Rfl:  .  Dimethyl Fumarate 240 MG CPDR, Take 1 capsule (240 mg total) by mouth 2 (two) times daily., Disp: 180 capsule, Rfl: 3 .  hydrochlorothiazide (MICROZIDE) 12.5 MG capsule, Take 12.5 mg by mouth daily., Disp: , Rfl:  .  nebivolol (BYSTOLIC) 5 MG tablet, Take 5 mg by mouth daily., Disp: , Rfl:  .  Prenatal Multivit-Min-Fe-FA (PRENATAL VITAMINS PO), Take 1 tablet by mouth daily. , Disp: , Rfl:  .  Turmeric 500 MG CAPS, Take by mouth., Disp: , Rfl:   PAST MEDICAL HISTORY: Past Medical History:  Diagnosis Date  . Hypertension   . Multiple sclerosis ([place])   . Neuromuscular disorder ([place])   . Ovarian cyst, left 12/19/2016  . Vision abnormalities     PAST SURGICAL HISTORY: Past Surgical History:  Procedure Laterality Date  . LAPAROSCOPIC OVARIAN CYSTECTOMY Left 12/19/2016   Procedure: LAPAROSCOPIC LEFT OVARIAN CYSTECTOMY LYSIS OF ADHESIONS;  Surgeon: Janyth Contes, MD;  Location: [facility];  Service: Gynecology;  Laterality: Left;  . WISDOM TOOTH EXTRACTION      FAMILY HISTORY: Family History  Problem Relation Age of Onset  . Diabetes Mother   . Hypertension Mother   . Diabetes Maternal Grandmother   . Hypertension Maternal Grandmother   . Liver cancer Maternal Grandfather     SOCIAL HISTORY:  Social History   Socioeconomic History  . Marital status: Married    Spouse name: Not on file  . Number of children: Not on file  . Years of education: Not on file  . Highest education level: Not on file  Occupational History  . Not on file  Social Needs  . Financial resource strain: Not on file  . Food insecurity:    Worry: Not on file    Inability: Not on file  . Transportation needs:    Medical: Not on file    Non-medical: Not on file  Tobacco Use  . Smoking status: Never Smoker  . Smokeless tobacco: Never Used  Substance and Sexual Activity  . Alcohol use: No  .  Drug use:  No  . Sexual activity: Not on file  Lifestyle  . Physical activity:    Days per week: Not on file    Minutes per session: Not on file  . Stress: Not on file  Relationships  . Social connections:    Talks on phone: Not on file    Gets together: Not on file    Attends religious service: Not on file    Active member of club or organization: Not on file    Attends meetings of clubs or organizations: Not on file    Relationship status: Not on file  . Intimate partner violence:    Fear of current or ex partner: Not on file    Emotionally abused: Not on file    Physically abused: Not on file    Forced sexual activity: Not on file  Other Topics Concern  . Not on file  Social History Narrative  . Not on file     PHYSICAL EXAM  Vitals:   11/07/17 1523  BP: (!) 138/95  Pulse: 79  Resp: 18  Weight: 274 lb (124.3 kg)  Height: 5\' 4"  (1.626 m)    Body mass index is 47.03 kg/m.   General: The patient is well-developed and well-nourished and in no acute distress  Neurologic Exam  Mental status: The patient is alert and oriented x 3 at the time of the examination. The patient has apparent normal recent and remote memory, with an apparently normal attention span and concentration ability.   Speech is normal.  Cranial nerves: Extraocular movements are full.  She has mildly reduced visual acuity on the right and also has reduced caudal saturation on about I.  There is a 2+ right APD.  Facial strength and sensation is normal.  Trapezius strength is normal.     The tongue is midline, and the patient has symmetric elevation of the soft palate. No obvious hearing deficits are noted.  Motor:  Muscle bulk is normal.   Tone is normal. Strength is  5 / 5 in all 4 extremities.   Sensory: She has normal sensation to touch and vibration in the arms and legs.  Coordination: Cerebellar testing shows good finger-nose-finger bilaterally.  Gait and station: Station is normal.   Her gait is normal.  Tandem gait is near normal . Romberg is negative.  Reflexes: Deep tendon reflexes are increased on the left.. She has spread at the left knee.       DIAGNOSTIC DATA (LABS, IMAGING, TESTING) - I reviewed patient records, labs, notes, testing and imaging myself where available.      ASSESSMENT AND PLAN  Multiple sclerosis ([place]) - Plan: MR BRAIN W WO CONTRAST, MR CERVICAL SPINE W WO CONTRAST, CBC with Differential/Platelet  History of optic neuritis  Numbness and tingling in left arm  High risk medication use  Shift work sleep disorder  Excessive daytime sleepiness     1.    She will continue Tecfidera.  I will check a CBC with differential today.  We will check an MRI of the brain and cervical spine to evaluate the left arm weakness and to determine if she is having any subclinical progression.  If present, or if she has another exacerbation, we will switch to more efficacious medication. 2.    Nuvigil for the shift work disorder 3.    Continue vitamin D supplementation.  4.    She will return to see me in 5-6 months or sooner if there are  new or worsening neurologic symptoms.     Richard A. Felecia Shelling, MD, PhD 09/03/7626, 3:15 PM Certified in Neurology, Clinical Neurophysiology, Sleep Medicine, Pain Medicine and Neuroimaging  [facility] [address] [place], [place] [address] [phone]

## 2017-11-08 ENCOUNTER — Telehealth: Payer: Self-pay | Admitting: *Deleted

## 2017-11-08 ENCOUNTER — Telehealth: Payer: Self-pay | Admitting: Neurology

## 2017-11-08 LAB — CBC WITH DIFFERENTIAL/PLATELET
BASOS ABS: 0 10*3/uL (ref 0.0–0.2)
Basos: 0 %
EOS (ABSOLUTE): 0.2 10*3/uL (ref 0.0–0.4)
Eos: 3 %
Hematocrit: 35.4 % (ref 34.0–46.6)
Hemoglobin: 11.3 g/dL (ref 11.1–15.9)
Immature Grans (Abs): 0 10*3/uL (ref 0.0–0.1)
Immature Granulocytes: 0 %
LYMPHS ABS: 1.1 10*3/uL (ref 0.7–3.1)
Lymphs: 23 %
MCH: 27.2 pg (ref 26.6–33.0)
MCHC: 31.9 g/dL (ref 31.5–35.7)
MCV: 85 fL (ref 79–97)
Monocytes Absolute: 0.6 10*3/uL (ref 0.1–0.9)
Monocytes: 12 %
NEUTROS ABS: 2.9 10*3/uL (ref 1.4–7.0)
Neutrophils: 62 %
PLATELETS: 310 10*3/uL (ref 150–379)
RBC: 4.16 x10E6/uL (ref 3.77–5.28)
RDW: 14.8 % (ref 12.3–15.4)
WBC: 4.8 10*3/uL (ref 3.4–10.8)

## 2017-11-08 NOTE — Telephone Encounter (Signed)
MR Brain w/wo contrast & MR Cervical spine w/wo contrast Dr. Cheree Ditto Auth: [id number] (exp. 11/08/17 to 12/07/17). Pt is scheduled for Tues 11/21/17 on the [facility] mobile unit.

## 2017-11-08 NOTE — Telephone Encounter (Signed)
-----   Message from Britt Bottom, MD sent at 11/08/2017 10:43 AM EDT ----- Please let the patient know that the lab work is fine.

## 2017-11-08 NOTE — Telephone Encounter (Signed)
LMOM (identified vm) with below lab results.  She does not need to return this call unless she has questions/fim 

## 2017-11-20 ENCOUNTER — Telehealth: Payer: Self-pay | Admitting: *Deleted

## 2017-11-20 ENCOUNTER — Telehealth: Payer: Self-pay | Admitting: Neurology

## 2017-11-20 MED ORDER — CLONAZEPAM 0.5 MG PO TABS: ORAL_TABLET | ORAL | 0 refills | Status: DC

## 2017-11-20 NOTE — Telephone Encounter (Signed)
Spoke with Joyceline.  She sts. she still has the left arm weakness that she had at her 11/07/17 appt., but had 3-4 days last week and thru yesterday that it was much worse, and radiated into left upper back from shoulder to neck.  Left arm weakness is better today, but still has pressure left upper back/neck. Denies other sx.  Has MRI cervical spine and brain scheduled for tomorrow but is worried she will not be able to stay still for MRI.  Clonazepam 0.5mg  #2 called to [facility], to take prn prior to MRI.  Will also speak with RAS about possibly adding a muscle relaxer for spasms.  Pt. is agreeable with this plan/fim

## 2017-11-20 NOTE — Telephone Encounter (Signed)
Patient calling. She thinks she is having an MS relapse. I advised Dr. Felecia Shelling is out of the office and will send to his nurse.

## 2017-11-21 ENCOUNTER — Ambulatory Visit: Payer: BLUE CROSS/BLUE SHIELD

## 2017-11-21 DIAGNOSIS — G35 Multiple sclerosis: Secondary | ICD-10-CM | POA: Diagnosis not present

## 2017-11-21 MED ORDER — BACLOFEN 10 MG PO TABS: 10.0000 mg | ORAL_TABLET | Freq: Three times a day (TID) | ORAL | 1 refills | Status: DC

## 2017-11-21 MED ORDER — GADOPENTETATE DIMEGLUMINE 469.01 MG/ML IV SOLN
20.0000 mL | Freq: Once | INTRAVENOUS | Status: AC | PRN
  Administered 2017-11-21: 20 mL via INTRAVENOUS

## 2017-11-21 NOTE — Addendum Note (Signed)
Addended by: France Ravens I on: 11/21/2017 10:56 AM   Modules accepted: Orders

## 2017-11-21 NOTE — Telephone Encounter (Signed)
Spoke with Denaja and per RAS, advised he wants to see the MRI once it's done this afternoon, but also ok to go ahead and add Baclofen 10mg  po tid, for the first wk. or so take 1/2 tab to promote tolerance of any side effects.  She is agreeable,, and I have escribed a rx. to [facility] per her request/fim

## 2017-11-22 ENCOUNTER — Telehealth: Payer: Self-pay | Admitting: *Deleted

## 2017-11-22 NOTE — Telephone Encounter (Signed)
-----   Message from Britt Bottom, MD sent at 11/22/2017 11:59 AM EDT ----- Please let her know that there are no new MS lesions in the brain.  The MRI of the cervical spine does show that she has 2 small MS plaques at the top of the spinal cord in 1 in the mid cervical region.  All of these look chronic.

## 2017-11-22 NOTE — Telephone Encounter (Signed)
LMOM (identified vm) with below MRI results.  She does not need to return this call unless she has questions/fim 

## 2017-11-25 ENCOUNTER — Other Ambulatory Visit: Payer: Self-pay | Admitting: Neurology

## 2018-02-28 LAB — HEPATIC FUNCTION PANEL
ALT: 13 (ref 7–35)
AST: 12 — AB (ref 13–35)

## 2018-02-28 LAB — HEMOGLOBIN A1C: Hemoglobin A1C: 5.6

## 2018-02-28 LAB — BASIC METABOLIC PANEL
BUN: 11 (ref 4–21)
CREATININE: 1 (ref 0.5–1.1)
GLUCOSE: 101
Potassium: 4.4 (ref 3.4–5.3)
Sodium: 139 (ref 137–147)

## 2018-02-28 LAB — TSH: TSH: 3.24 (ref 0.41–5.90)

## 2018-05-07 ENCOUNTER — Encounter: Payer: Self-pay | Admitting: Internal Medicine

## 2018-08-24 ENCOUNTER — Telehealth: Payer: Self-pay

## 2018-08-24 NOTE — Telephone Encounter (Signed)
RECIEVED A PA FOR SAXENDA PT STATED THAT SHE IS NO LONGER TAKING MEDICATION

## 2018-09-03 ENCOUNTER — Ambulatory Visit: Payer: BLUE CROSS/BLUE SHIELD | Admitting: Internal Medicine

## 2018-09-03 ENCOUNTER — Encounter: Payer: Self-pay | Admitting: Internal Medicine

## 2018-09-03 ENCOUNTER — Other Ambulatory Visit: Payer: Self-pay

## 2018-09-03 VITALS — BP 122/90 | HR 86 | Temp 98.5°F | Ht 65.2 in | Wt 281.0 lb

## 2018-09-03 DIAGNOSIS — K5904 Chronic idiopathic constipation: Secondary | ICD-10-CM | POA: Diagnosis not present

## 2018-09-03 DIAGNOSIS — I10 Essential (primary) hypertension: Secondary | ICD-10-CM | POA: Diagnosis not present

## 2018-09-03 DIAGNOSIS — Z0000 Encounter for general adult medical examination without abnormal findings: Secondary | ICD-10-CM

## 2018-09-03 DIAGNOSIS — Z6841 Body Mass Index (BMI) 40.0 and over, adult: Secondary | ICD-10-CM | POA: Diagnosis not present

## 2018-09-03 DIAGNOSIS — E66813 Obesity, class 3: Secondary | ICD-10-CM

## 2018-09-03 LAB — POCT URINALYSIS DIPSTICK
BILIRUBIN UA: NEGATIVE
GLUCOSE UA: NEGATIVE
KETONES UA: NEGATIVE
Leukocytes, UA: NEGATIVE
Nitrite, UA: NEGATIVE
PH UA: 7 (ref 5.0–8.0)
Protein, UA: NEGATIVE
Spec Grav, UA: 1.02 (ref 1.010–1.025)
Urobilinogen, UA: 0.2 E.U./dL

## 2018-09-03 LAB — POCT UA - MICROALBUMIN
Creatinine, POC: 300 mg/dL
Microalbumin Ur, POC: 30 mg/L

## 2018-09-03 MED ORDER — HYDROCHLOROTHIAZIDE 12.5 MG PO CAPS: 12.5000 mg | ORAL_CAPSULE | Freq: Every day | ORAL | 1 refills | Status: DC

## 2018-09-03 MED ORDER — NEBIVOLOL HCL 5 MG PO TABS: 5.0000 mg | ORAL_TABLET | Freq: Every day | ORAL | 1 refills | Status: DC

## 2018-09-03 MED ORDER — LABETALOL HCL 100 MG PO TABS: 100.0000 mg | ORAL_TABLET | Freq: Two times a day (BID) | ORAL | 1 refills | Status: DC

## 2018-09-03 NOTE — Progress Notes (Signed)
Subjective:     Patient ID: Courtney Meza , female    DOB: [birth year] , 33 y.o.   MRN: [id number]   Chief Complaint  Patient presents with  . Annual Exam  . Hypertension    HPI  She is here today for a full physical examination. She is followed by Dr. Garwin Brothers for her pelvic exams. She is seen on a yearly basis.   Hypertension  This is a chronic problem. The current episode started more than 1 year ago. The problem has been gradually improving since onset. The problem is controlled. Pertinent negatives include no blurred vision, chest pain, palpitations or shortness of breath. Risk factors for coronary artery disease include obesity, sedentary lifestyle and stress. Compliance problems include exercise.      Past Medical History:  Diagnosis Date  . Hypertension   . Multiple sclerosis ([place])   . Neuromuscular disorder ([place])   . Ovarian cyst, left 12/19/2016  . Vision abnormalities      Family History  Problem Relation Age of Onset  . Diabetes Mother   . Hypertension Mother   . Healthy Father   . Diabetes Maternal Grandmother   . Hypertension Maternal Grandmother   . Liver cancer Maternal Grandfather      Current Outpatient Medications:  .  Armodafinil 250 MG tablet, Take 1/2 to 1 pill po every morning, Disp: 90 tablet, Rfl: 1 .  baclofen (LIORESAL) 10 MG tablet, Take 1 tablet (10 mg total) by mouth 3 (three) times daily., Disp: 270 each, Rfl: 1 .  Cholecalciferol (VITAMIN D3) 5000 units TABS, Take 1 tablet by mouth daily., Disp: , Rfl:  .  Prenatal Multivit-Min-Fe-FA (PRENATAL VITAMINS PO), Take 1 tablet by mouth daily. , Disp: , Rfl:  .  hydrochlorothiazide (MICROZIDE) 12.5 MG capsule, Take 1 capsule (12.5 mg total) by mouth daily., Disp: 90 capsule, Rfl: 1 .  labetalol (NORMODYNE) 100 MG tablet, Take 1 tablet (100 mg total) by mouth 2 (two) times daily., Disp: 60 tablet, Rfl: 1   Allergies  Allergen Reactions  . Latex Hives      Patient's last menstrual period  was 08/27/2018..  Negative for: breast discharge, breast lump(s), breast pain and breast self exam. Associated symptoms include abnormal vaginal bleeding. Pertinent negatives include abnormal bleeding (hematology), anxiety, decreased libido, depression, difficulty falling sleep, dyspareunia, history of infertility, nocturia, sexual dysfunction, sleep disturbances, urinary incontinence, urinary urgency, vaginal discharge and vaginal itching. Diet regular.The patient states her exercise level is  minimal.   . The patient's tobacco use is:  Social History   Tobacco Use  Smoking Status Never Smoker  Smokeless Tobacco Never Used  . She has been exposed to passive smoke. The patient's alcohol use is:  Social History   Substance and Sexual Activity  Alcohol Use No   Review of Systems  Constitutional: Negative.   HENT: Negative.   Eyes: Negative.  Negative for blurred vision.  Respiratory: Negative.  Negative for shortness of breath.   Cardiovascular: Negative for chest pain and palpitations.  Gastrointestinal: Positive for constipation.  Endocrine: Negative.   Genitourinary: Negative.   Musculoskeletal: Negative.   Allergic/Immunologic: Negative.   Neurological: Negative.   Hematological: Negative.   Psychiatric/Behavioral: Negative.      Today's Vitals   09/03/18 0910  BP: 122/90  Pulse: 86  Temp: 98.5 F (36.9 C)  TempSrc: Oral  Weight: 281 lb (127.5 kg)  Height: 5' 5.2" (1.656 m)   Body mass index is 46.47 kg/m.   Objective:  Physical  Exam Vitals signs and nursing note reviewed.  Constitutional:      Appearance: Normal appearance. She is obese.  HENT:     Head: Normocephalic and atraumatic.     Right Ear: Tympanic membrane, ear canal and external ear normal.     Left Ear: Tympanic membrane, ear canal and external ear normal.     Nose: Nose normal.     Mouth/Throat:     Mouth: Mucous membranes are moist.     Pharynx: Oropharynx is clear.  Eyes:     Extraocular  Movements: Extraocular movements intact.     Conjunctiva/sclera: Conjunctivae normal.     Pupils: Pupils are equal, round, and reactive to light.  Neck:     Musculoskeletal: Normal range of motion and neck supple.  Cardiovascular:     Rate and Rhythm: Normal rate and regular rhythm.     Pulses: Normal pulses.     Heart sounds: Normal heart sounds.  Pulmonary:     Effort: Pulmonary effort is normal.     Breath sounds: Normal breath sounds.  Chest:     Breasts:        Right: Normal. No swelling, bleeding, inverted nipple, mass, nipple discharge or skin change.        Left: Normal. No swelling, bleeding, inverted nipple, mass, nipple discharge or skin change.  Abdominal:     General: Bowel sounds are normal.     Palpations: Abdomen is soft.     Comments: Obese. Difficult to assess organomegaly due to body habitus  Genitourinary:    Comments: deferred Musculoskeletal: Normal range of motion.  Skin:    General: Skin is warm and dry.     Capillary Refill: Capillary refill takes less than 2 seconds.  Neurological:     General: No focal deficit present.     Mental Status: She is alert and oriented to person, place, and time.  Psychiatric:        Mood and Affect: Mood normal.        Behavior: Behavior normal.         Assessment And Plan:     1. Routine general medical examination at health care facility  A full exam was performed.  Importance of monthly self breast exams was discussed with the patient. PATIENT HAS BEEN ADVISED TO GET 30-45 MINUTES REGULAR EXERCISE NO LESS THAN FOUR TO FIVE DAYS PER WEEK - BOTH WEIGHTBEARING EXERCISES AND AEROBIC ARE RECOMMENDED.  SHE IS ADVISED TO FOLLOW A HEALTHY DIET WITH AT LEAST SIX FRUITS/VEGGIES PER DAY, DECREASE INTAKE OF RED MEAT, AND TO INCREASE FISH INTAKE TO TWO DAYS PER WEEK.  MEATS/FISH SHOULD NOT BE FRIED, BAKED OR BROILED IS PREFERABLE.  I SUGGEST WEARING SPF 50 SUNSCREEN ON EXPOSED PARTS AND ESPECIALLY WHEN IN THE DIRECT SUNLIGHT FOR AN  EXTENDED PERIOD OF TIME.  PLEASE AVOID FAST FOOD RESTAURANTS AND INCREASE YOUR WATER INTAKE.  - CMP14+EGFR - CBC - Lipid panel - Hemoglobin A1c  2. Essential hypertension, benign  Fair control. She is planning to get pregnant in the near future. I will stop Bystolic today.  She was given rx labetalol 147m twice daily. She will continue with hctz 12.520mfor now.  She will rto in four weeks for re-evaluation. EKG performed, this is NSR.   3. Chronic idiopathic constipation  Chronic.  She is not able to tolerate Linzess due to diarrhea. I will give her samples of Amitiza to try.   4. Class 3 severe obesity due to excess calories  with serious comorbidity and body mass index (BMI) of 45.0 to 49.9 in adult [facility])  She is encouraged to strive for BMI less than 35 to decrease cardiac risk. She is encouraged to incorporate more exercise into her daily routine. She is encouraged to aim for 30 minutes five days weekly.   Maximino Greenland, MD

## 2018-09-03 NOTE — Patient Instructions (Signed)

## 2018-09-04 LAB — LIPID PANEL
Chol/HDL Ratio: 4 ratio (ref 0.0–4.4)
Cholesterol, Total: 183 mg/dL (ref 100–199)
HDL: 46 mg/dL (ref >39)
LDL Calculated: 126 mg/dL — ABNORMAL HIGH (ref 0–99)
TRIGLYCERIDES: 57 mg/dL (ref 0–149)
VLDL Cholesterol Cal: 11 mg/dL (ref 5–40)

## 2018-09-04 LAB — CMP14+EGFR
A/G RATIO: 1.3 (ref 1.2–2.2)
ALT: 13 IU/L (ref 0–32)
AST: 13 IU/L (ref 0–40)
Albumin: 4.2 g/dL (ref 3.8–4.8)
Alkaline Phosphatase: 95 IU/L (ref 39–117)
BUN / CREAT RATIO: 13 (ref 9–23)
BUN: 10 mg/dL (ref 6–20)
CHLORIDE: 97 mmol/L (ref 96–106)
CO2: 27 mmol/L (ref 20–29)
Calcium: 9.4 mg/dL (ref 8.7–10.2)
Creatinine, Ser: 0.8 mg/dL (ref 0.57–1.00)
GFR calc non Af Amer: 96 mL/min/{1.73_m2} (ref >59)
GFR, EST AFRICAN AMERICAN: 111 mL/min/{1.73_m2} (ref >59)
Globulin, Total: 3.2 g/dL (ref 1.5–4.5)
Glucose: 90 mg/dL (ref 65–99)
Potassium: 3.9 mmol/L (ref 3.5–5.2)
Sodium: 138 mmol/L (ref 134–144)
TOTAL PROTEIN: 7.4 g/dL (ref 6.0–8.5)

## 2018-09-04 LAB — CBC
HEMOGLOBIN: 12 g/dL (ref 11.1–15.9)
Hematocrit: 36.3 % (ref 34.0–46.6)
MCH: 26.6 pg (ref 26.6–33.0)
MCHC: 33.1 g/dL (ref 31.5–35.7)
MCV: 81 fL (ref 79–97)
Platelets: 380 10*3/uL (ref 150–450)
RBC: 4.51 x10E6/uL (ref 3.77–5.28)
RDW: 13.5 % (ref 11.7–15.4)
WBC: 4.4 10*3/uL (ref 3.4–10.8)

## 2018-09-04 LAB — HEMOGLOBIN A1C
Est. average glucose Bld gHb Est-mCnc: 111 mg/dL
Hgb A1c MFr Bld: 5.5 % (ref 4.8–5.6)

## 2018-09-05 ENCOUNTER — Encounter: Payer: Self-pay | Admitting: Internal Medicine

## 2018-09-28 ENCOUNTER — Other Ambulatory Visit: Payer: Self-pay | Admitting: Neurology

## 2018-10-01 ENCOUNTER — Telehealth: Payer: Self-pay | Admitting: *Deleted

## 2018-10-01 NOTE — Telephone Encounter (Signed)
PA Tecfidera submitted on covermymeds. Key: [id number]. Effective from 10/01/2018 through 09/29/2021.

## 2018-10-01 NOTE — Telephone Encounter (Signed)
I called pt because she had no f/u scheduled. She was last seen 11/07/2017. Pt states she stopped Tecfidera a couple months ago d/t losing her insurance. She did not contact [employer] at that time. She now has insurance coverage via [employer]. She is [facility] to [facility] rx tecfidera. Has not gotten a refill yet.  Advised PA was approved. Scheduled f/u with Debbora Presto, NP tomorrow at 930am, check in 900am. She verbalized understanding and appreciation. She will bring insurance cards/updated med list.

## 2018-10-02 ENCOUNTER — Ambulatory Visit: Payer: BLUE CROSS/BLUE SHIELD | Admitting: Family Medicine

## 2018-10-02 ENCOUNTER — Encounter: Payer: Self-pay | Admitting: Family Medicine

## 2018-10-02 VITALS — BP 138/95 | HR 85 | Ht 65.2 in | Wt 288.4 lb

## 2018-10-02 DIAGNOSIS — M62838 Other muscle spasm: Secondary | ICD-10-CM | POA: Diagnosis not present

## 2018-10-02 DIAGNOSIS — G35 Multiple sclerosis: Secondary | ICD-10-CM

## 2018-10-02 DIAGNOSIS — R5382 Chronic fatigue, unspecified: Secondary | ICD-10-CM

## 2018-10-02 MED ORDER — BACLOFEN 10 MG PO TABS: 10.0000 mg | ORAL_TABLET | Freq: Three times a day (TID) | ORAL | 1 refills | Status: DC

## 2018-10-02 MED ORDER — DIMETHYL FUMARATE 240 MG PO CPDR: 240.0000 mg | DELAYED_RELEASE_CAPSULE | Freq: Two times a day (BID) | ORAL | 3 refills | Status: DC

## 2018-10-02 MED ORDER — ARMODAFINIL 250 MG PO TABS: ORAL_TABLET | ORAL | 1 refills | Status: DC

## 2018-10-02 NOTE — Progress Notes (Signed)
I have read the note, and I agree with the clinical assessment and plan.  Romel Dumond A. Keyanah Kozicki, MD, PhD, FAAN Certified in Neurology, Clinical Neurophysiology, Sleep Medicine, Pain Medicine and Neuroimaging  [facility] [address], [address] [place], [place] [address] [phone]  

## 2018-10-02 NOTE — Patient Instructions (Signed)
Start Tecfidera as prescribed  Continue armodafinil and baclofen as prescribed  Follow up with Dr Felecia Shelling in 6 months  Multiple Sclerosis Multiple sclerosis (MS) is a disease of the brain, spinal cord, and optic nerves (central nervous system). It causes the body's disease-fighting (immune) system to destroy the protective covering (myelin sheath) around nerves in the brain. When this happens, signals (nerve impulses) going to and from the brain and spinal cord do not get sent properly or may not get sent at all. There are several types of MS:  Relapsing-remitting MS. This is the most common type. This causes sudden attacks of symptoms. After an attack, you may recover completely until the next attack, or some symptoms may remain permanently.  Secondary progressive MS. This usually develops after the onset of relapsing-remitting MS. Similar to relapsing-remitting MS, this type also causes sudden attacks of symptoms. Attacks may be less frequent, but symptoms slowly get worse (progress) over time.  Primary progressive MS. This causes symptoms that steadily progress over time. This type of MS does not cause sudden attacks of symptoms. The age of onset of MS varies, but it often develops between 55-39 years of age. MS is a lifelong (chronic) condition. There is no cure, but treatment can help slow down the progression of the disease. What are the causes? The cause of this condition is not known. What increases the risk? You are more likely to develop this condition if:  You are a woman.  You have a relative with MS. However, the condition is not passed from parent to child (inherited).  You have a lack (deficiency) of vitamin D.  You smoke. MS is more common in the [place] than in the [place]. What are the signs or symptoms? Relapsing-remitting and secondary progressive MS cause symptoms to occur in episodes or attacks that may last weeks to months. There may be  long periods between attacks in which there are almost no symptoms. Primary progressive MS causes symptoms to steadily progress after they develop. Symptoms of MS vary because of the many different ways it affects the central nervous system. The main symptoms include:  Vision problems and eye pain.  Numbness.  Weakness.  Inability to move your arms, hands, feet, or legs (paralysis).  Balance problems.  Shaking that you cannot control (tremors).  Muscle spasms.  Problems with thinking (cognitive changes). MS can also cause symptoms that are associated with the disease, but are not always the direct result of an MS attack. They may include:  Inability to control urination or bowel movements (incontinence).  Headaches.  Fatigue.  Inability to tolerate heat.  Emotional changes.  Depression.  Pain. How is this diagnosed? This condition is diagnosed based on:  Your symptoms.  A neurological exam. This involves checking central nervous system function, such as nerve function, reflexes, and coordination.  MRIs of the brain and spinal cord.  Lab tests, including a lumbar puncture that tests the fluid that surrounds the brain and spinal cord (cerebrospinal fluid).  Tests to measure the electrical activity of the brain in response to stimulation (evoked potentials). How is this treated? There is no cure for MS, but medicines can help decrease the number and frequency of attacks and help relieve nuisance symptoms. Treatment options may include:  Medicines that reduce the frequency of attacks. These medicines may be given by injection, by mouth (orally), or through an IV.  Medicines that reduce inflammation (steroids). These may provide short-term relief of symptoms.  Medicines  to help control pain, depression, fatigue, or incontinence.  Vitamin D, if you have a deficiency.  Using devices to help you move around (assistive devices), such as braces, a cane, or a  walker.  Physical therapy to strengthen and stretch your muscles.  Occupational therapy to help you with everyday tasks.  Alternative or complementary treatments such as exercise, massage, or acupuncture. Follow these instructions at home:  Take over-the-counter and prescription medicines only as told by your health care provider.  Do not drive or use heavy machinery while taking prescription pain medicine.  Use assistive devices as recommended by your physical therapist or your health care provider.  Exercise as directed by your health care provider.  Return to your normal activities as told by your health care provider. Ask your health care provider what activities are safe for you.  Reach out for support. Share your feelings with friends, family, or a support group.  Keep all follow-up visits as told by your health care provider and therapists. This is important. Where to find more information  National Multiple Sclerosis Society: https://www.nationalmssociety.org Contact a health care provider if:  You feel depressed.  You develop new pain or numbness.  You have tremors.  You have problems with sexual function. Get help right away if:  You develop paralysis.  You develop numbness.  You have problems with your bladder or bowel function.  You develop double vision.  You lose vision in one or both eyes.  You develop suicidal thoughts.  You develop severe confusion. If you ever feel like you may hurt yourself or others, or have thoughts about taking your own life, get help right away. You can go to your nearest emergency department or call:  Your local emergency services (911 in the [place]  A suicide crisis helpline, such as the [facility] at 845-064-2618. This is open 24 hours a day. Summary  Multiple sclerosis (MS) is a disease of the central nervous system that causes the body's immune system to destroy the protective covering  (myelin sheath) around nerves in the brain.  There are 3 types of MS: relapsing-remitting, secondary progressive, and primary progressive. Relapsing-remitting and secondary progressive MS cause symptoms to occur in episodes or attacks that may last weeks to months. Primary progressive MS causes symptoms to steadily progress after they develop.  There is no cure for MS, but medicines can help decrease the number and frequency of attacks and help relieve nuisance symptoms. Treatment may also include physical or occupational therapy.  If you develop numbness, paralysis, vision problems, or other neurological symptoms, get help right away. This information is not intended to replace advice given to you by your health care provider. Make sure you discuss any questions you have with your health care provider. Document Released: 07/15/2000 Document Revised: 09/26/2016 Document Reviewed: 09/26/2016 Elsevier Interactive Patient Education  2019 [employer].

## 2018-10-02 NOTE — Progress Notes (Signed)
PATIENT: Courtney Meza DOB: 08-28-1985  REASON FOR VISIT: follow up HISTORY FROM: patient  Chief Complaint  Patient presents with  . Follow-up    Yearly follow up. Alone. Rm 9. No new concerns at this time.      HISTORY OF PRESENT ILLNESS: Today 10/02/18 Courtney Meza is a 33 y.o. female here today for follow up for MS. She reports that she lost her insurance about 3 months ago and had to stop Tecfidera. She has not taken medication since this time. She has not had any new or worrisome symptoms. She She feels that MS is stable. She has not noticed any changes in vision. She does have some urinary frequency but does not feel this requires treatment at this time. No incontinence. No changes in gait. No falls. She stays fatigued. She feels that armodafinil helps with this. She usually takes it about 2-3 times a week.  She is feeling well and without complaints today.   HISTORY: (copied from Dr Garth Bigness note on 11/07/2017) Courtney Meza is a 33 y.o. woman with MS diagnosed early 2018.      Update 11/07/2017: She is noting some left arm weakness and heaviness seems worse since starting Tecfidera.   There is also some tingling.    There have not been any definite exacerbations since her last visit.  She generally tolerates Tecfidera well but will have mild flushing at times.    She notes no change in gait.    She notes no change in bladder function.     Balance is fine.   Vision is stable with poor right acuity and color vision since optic neuritis in 2006.     She is noting a lot of fatigue.   She works nights and shifts her schedule on days off.    She often needs a nap.  Armodafinil helps her daytime sleepiness.     She has some anxiety but not depression.    Sometimes she feels her chest gets tight associated with nervousness.    If distracted, she feels better.    Update 06/07/2017: She feels her MS is mostly stable but she has had a couple episodes of 24-36 hours of left  hand tingling and numbness with some 'jumping'.  . She is on Tecfidera since March 2018. She tolerates it well. Occasionally she will have some flushing but this is not a problem. Denies GI issues. She had one blood test where the white blood cells were low and the neutrophils were also low at 0.7 but a repeat test a month later was fine. Her last lymphocyte count was 1.4.    Gait is doing well.   She notes mild stable left arm weakness.  She has no numbness now in hands or legs.  Bladder is fine.  Vision is worse on her right but unchanged.  Colors are desaturated on the right.   She also had optic neuritis in 2006 (but MRI had no white matter foci at that time).  She notes more fatigue.  Of note, she works night shifts (x last 13 years)   She sleeps well but usually needs a long nap daily.  She sleeps 11 hours on her off days.    She denies depression or anxiety.   Her cognition is fine.    ___________________________________ From 01/09/2017: MS:   She started Tecfidera 09/2016 and is tolerating it well.   She has not had any exacerbations.    She did note mild left  arm symptoms (slightly weaker) that started after the last visit (but before Tecfidera was started) but it improved some a few weeks later.    A month ago we checked a blood count and her WBC was reduced and the neutrophil count was reduced at 0.7. We repeated it 2 weeks ago and they white blood cell count and neutrophils are back to normal.  Gait/strength/sensatrion:   She feels her gait does well. She denies any numbness or weakness in her legs. She notes numbness in the left arm since last year that became worse in March 2018 but then returned back to baseline.  Bladder:  She reports some urinary urgency and frequency. She denies incontinence. She has nocturia couple times a night.    Vision:   She notes mild reduced vision out of the right eye. She denies any changes in color vision.   Fatigue/sleep:   She has noted fatigue over  the past 2 years. She is uncertain if this is due to her work as she works night shifts. She sleeps well some nights but has some troule falling asleep during the day.  She does not snore.  Mood/Cognition   She denies any difficulty with mood or cognition.  Low Vit D.  She had low vitamin D in the past and was on 50,000 units weekly for a while and now takes over-the-counter supplements.  MS History:   In April 2006, while driving home she had the sudden onset of reduced right vision like she was looking through a heavy fog.     Vision improved over the next year but not to baseline and has been stable since..   Later in 2006, she had the onset of right facial weakness.  She had two weeks of facial twitching and then had facial weakness that seemed to develop over minutes to one hour.  She was told she had Bell's palsy.  She is uncertain whether she had complete loss of strength in the upper face.  She had no other major symptoms over the next decade.   However, last year she had numbness in the left arm over a couple weeks The 2006 MRI showed pituitary enlargement.   Prolactin levels were recently elevated and she had a follow-up MRI showing stable pituitary enlargement and a pars intermedius cyst.   On the 09/23/2016 MRI, she had multiple white matter lesions, periventricular > juxtacortical, a left midbrain and right pons lesion.  Brain lesions were not present on the previous MRI.    MS History:   In April 2006, while driving home she had the sudden onset of reduced right vision like she was looking through a heavy fog.     Vision improved over the next year but not to baseline and has been stable since..   Later in 2006, she had the onset of right facial weakness.  She had two weeks of facial twitching and then had facial weakness that seemed to develop over minutes to one hour.  She was told she had Bell's palsy.  She is uncertain whether she had complete loss of strength in the upper face.  She  had no other major symptoms over the next decade.   However, last year she had numbness in the left arm over a couple weeks The 2006 MRI showed pituitary enlargement.   Prolactin levels were recently elevated and she had a follow-up MRI showing stable pituitary enlargement and a pars intermedius cyst.   On the 09/23/2016 MRI, she had  multiple white matter lesions, periventricular > juxtacortical, a left midbrain and right pons lesion.  Brain lesions were not present on the previous MRI.    Data: The MRI of the brain in October 30, 2004 did not show any brain lesions.   However, the second MRI performed 11/07/2004 appeared to show diffusion weighted hyperintensity in the right optic nerve as well as enhancement of the right optic nervenot present on the original 10/30/2004 MRI.    The MRI from 09/23/2016 shows multiple T2/FLAIR hyperintense foci in the hemispheres, predominantly in the periventricular white matter but also in the juxtacortical white matter. Additionally there are left midbrain and right pontine lesions.   There is questionable optic nerve enhancement.   REVIEW OF SYSTEMS: Out of a complete 14 system review of symptoms, the patient complains only of the following symptoms, eye itching, eye red constipation, daytime sleepiness, back pain, muscle cramps, neck pain, neck stiffness and all other reviewed systems are negative.  ALLERGIES: Allergies  Allergen Reactions  . Latex Hives    HOME MEDICATIONS: Outpatient Medications Prior to Visit  Medication Sig Dispense Refill  . Cholecalciferol (VITAMIN D3) 5000 units TABS Take 1 tablet by mouth daily.    . hydrochlorothiazide (MICROZIDE) 12.5 MG capsule Take 1 capsule (12.5 mg total) by mouth daily. 90 capsule 1  . labetalol (NORMODYNE) 100 MG tablet Take 1 tablet (100 mg total) by mouth 2 (two) times daily. 60 tablet 1  . Prenatal Multivit-Min-Fe-FA (PRENATAL VITAMINS PO) Take 1 tablet by mouth daily.     . Armodafinil 250 MG tablet Take  1/2 to 1 pill po every morning 90 tablet 1  . baclofen (LIORESAL) 10 MG tablet Take 1 tablet (10 mg total) by mouth 3 (three) times daily. 270 each 1   No facility-administered medications prior to visit.     PAST MEDICAL HISTORY: Past Medical History:  Diagnosis Date  . Hypertension   . Multiple sclerosis ([place])   . Neuromuscular disorder ([place])   . Ovarian cyst, left 12/19/2016  . Vision abnormalities     PAST SURGICAL HISTORY: Past Surgical History:  Procedure Laterality Date  . LAPAROSCOPIC OVARIAN CYSTECTOMY Left 12/19/2016   Procedure: LAPAROSCOPIC LEFT OVARIAN CYSTECTOMY LYSIS OF ADHESIONS;  Surgeon: Janyth Contes, MD;  Location: [facility];  Service: Gynecology;  Laterality: Left;  . WISDOM TOOTH EXTRACTION      FAMILY HISTORY: Family History  Problem Relation Age of Onset  . Diabetes Mother   . Hypertension Mother   . Healthy Father   . Diabetes Maternal Grandmother   . Hypertension Maternal Grandmother   . Liver cancer Maternal Grandfather     SOCIAL HISTORY: Social History   Socioeconomic History  . Marital status: Married    Spouse name: Not on file  . Number of children: Not on file  . Years of education: Not on file  . Highest education level: Not on file  Occupational History  . Not on file  Social Needs  . Financial resource strain: Not on file  . Food insecurity:    Worry: Not on file    Inability: Not on file  . Transportation needs:    Medical: Not on file    Non-medical: Not on file  Tobacco Use  . Smoking status: Never Smoker  . Smokeless tobacco: Never Used  Substance and Sexual Activity  . Alcohol use: No  . Drug use: No  . Sexual activity: Not on file  Lifestyle  . Physical activity:  Days per week: Not on file    Minutes per session: Not on file  . Stress: Not on file  Relationships  . Social connections:    Talks on phone: Not on file    Gets together: Not on file    Attends religious service: Not on file    Active  member of club or organization: Not on file    Attends meetings of clubs or organizations: Not on file    Relationship status: Not on file  . Intimate partner violence:    Fear of current or ex partner: Not on file    Emotionally abused: Not on file    Physically abused: Not on file    Forced sexual activity: Not on file  Other Topics Concern  . Not on file  Social History Narrative  . Not on file      PHYSICAL EXAM  Vitals:   10/02/18 0930  BP: (!) 138/95  Pulse: 85  Weight: 288 lb 6.4 oz (130.8 kg)  Height: 5' 5.2" (1.656 m)   Body mass index is 47.7 kg/m.  Generalized: Well developed, in no acute distress  Cardiology: normal rate and rhythm, no murmur noted Neurological examination  Mentation: Alert oriented to time, place, history taking. Follows all commands speech and language fluent Cranial nerve II-XII: Pupils were equal round reactive to light. Extraocular movements were full, visual field were full on confrontational test. Facial sensation and strength were normal. Uvula tongue midline. Head turning and shoulder shrug  were normal and symmetric. Motor: The motor testing reveals 5 over 5 strength of all 4 extremities. Good symmetric motor tone is noted throughout.  Sensory: Sensory testing is intact to soft touch on all 4 extremities. No evidence of extinction is noted.  Coordination: Cerebellar testing reveals good finger-nose-finger and heel-to-shin bilaterally.  Gait and station: Gait is normal.  Reflexes: Deep tendon reflexes are symmetric and normal bilaterally.   DIAGNOSTIC DATA (LABS, IMAGING, TESTING) - I reviewed patient records, labs, notes, testing and imaging myself where available.  No flowsheet data found.   Lab Results  Component Value Date   WBC 4.4 09/03/2018   HGB 12.0 09/03/2018   HCT 36.3 09/03/2018   MCV 81 09/03/2018   PLT 380 09/03/2018      Component Value Date/Time   NA 138 09/03/2018 1017   K 3.9 09/03/2018 1017   CL 97  09/03/2018 1017   CO2 27 09/03/2018 1017   GLUCOSE 90 09/03/2018 1017   GLUCOSE 93 12/13/2016 1055   BUN 10 09/03/2018 1017   CREATININE 0.80 09/03/2018 1017   CALCIUM 9.4 09/03/2018 1017   PROT 7.4 09/03/2018 1017   ALBUMIN 4.2 09/03/2018 1017   AST 13 09/03/2018 1017   ALT 13 09/03/2018 1017   ALKPHOS 95 09/03/2018 1017   BILITOT <0.2 09/03/2018 1017   GFRNONAA 96 09/03/2018 1017   GFRAA 111 09/03/2018 1017   Lab Results  Component Value Date   CHOL 183 09/03/2018   HDL 46 09/03/2018   LDLCALC 126 (H) 09/03/2018   TRIG 57 09/03/2018   CHOLHDL 4.0 09/03/2018   Lab Results  Component Value Date   HGBA1C 5.5 09/03/2018   No results found for: VITAMINB12 Lab Results  Component Value Date   TSH 3.24 02/28/2018    ASSESSMENT AND PLAN 33 y.o. year old female  has a past medical history of Hypertension, Multiple sclerosis ([name]), Neuromuscular disorder ([place]), Ovarian cyst, left (12/19/2016), and Vision abnormalities. here with  ICD-10-CM   1. Multiple sclerosis ([place]) G35 CBC with Differential/Platelets    Dimethyl Fumarate (TECFIDERA) 240 MG CPDR  2. Muscle spasms of both lower extremities M62.838 baclofen (LIORESAL) 10 MG tablet  3. Chronic fatigue R53.82 baclofen (LIORESAL) 10 MG tablet    We will get her back on Tecfidera twice daily as prescribed.  I have also given her patient assistance information from [employer] and instructed her to reach out for assistance if needed.  Fortunately she has not noted any new symptoms.  I have advised that she monitor closely for any new or worsening symptoms.  We will update CBC today.  We will continue armodafinil and baclofen as prescribed.  We will see her back in 6 months.   Orders Placed This Encounter  Procedures  . CBC with Differential/Platelets     Meds ordered this encounter  Medications  . Dimethyl Fumarate (TECFIDERA) 240 MG CPDR    Sig: Take 1 capsule (240 mg total) by mouth 2 (two) times daily.    Dispense:  180  capsule    Refill:  3    Order Specific Question:   Supervising Provider    Answer:   Melvenia Beam [id number]  . Armodafinil 250 MG tablet    Sig: Take 1/2 to 1 pill po every morning    Dispense:  90 tablet    Refill:  1    Order Specific Question:   Supervising Provider    Answer:   Melvenia Beam [id number]  . baclofen (LIORESAL) 10 MG tablet    Sig: Take 1 tablet (10 mg total) by mouth 3 (three) times daily.    Dispense:  270 each    Refill:  1    For the first week or so pt. will take 1/2 tab tid    Order Specific Question:   Supervising Provider    Answer:   Melvenia Beam [[id number]]        Chenee Munns Ubaldo Glassing, FNP-C 10/02/2018, 10:50 AM [facility] [address] [place],  [address] [phone]

## 2018-10-03 LAB — CBC WITH DIFFERENTIAL/PLATELET
Basophils Absolute: 0 10*3/uL (ref 0.0–0.2)
Basos: 1 %
EOS (ABSOLUTE): 0.1 10*3/uL (ref 0.0–0.4)
EOS: 2 %
HEMATOCRIT: 35.4 % (ref 34.0–46.6)
Hemoglobin: 11.4 g/dL (ref 11.1–15.9)
Immature Grans (Abs): 0 10*3/uL (ref 0.0–0.1)
Immature Granulocytes: 0 %
Lymphocytes Absolute: 1.3 10*3/uL (ref 0.7–3.1)
Lymphs: 24 %
MCH: 26.5 pg — ABNORMAL LOW (ref 26.6–33.0)
MCHC: 32.2 g/dL (ref 31.5–35.7)
MCV: 82 fL (ref 79–97)
Monocytes Absolute: 0.4 10*3/uL (ref 0.1–0.9)
Monocytes: 7 %
Neutrophils Absolute: 3.8 10*3/uL (ref 1.4–7.0)
Neutrophils: 66 %
Platelets: 344 10*3/uL (ref 150–450)
RBC: 4.31 x10E6/uL (ref 3.77–5.28)
RDW: 13.6 % (ref 11.7–15.4)
WBC: 5.6 10*3/uL (ref 3.4–10.8)

## 2018-10-04 ENCOUNTER — Ambulatory Visit: Payer: BLUE CROSS/BLUE SHIELD | Admitting: Internal Medicine

## 2018-10-12 ENCOUNTER — Telehealth: Payer: Self-pay | Admitting: *Deleted

## 2018-10-12 NOTE — Telephone Encounter (Signed)
PA for Tecfidera 240mg  capsules, 2 per day, completed on CoverMyMeds. Key# [id number]. Dx: RRMS (G35)/fim

## 2018-10-16 NOTE — Telephone Encounter (Signed)
PA approved effective from 10/12/2018 through 10/10/2021.

## 2018-11-20 ENCOUNTER — Encounter: Payer: Self-pay | Admitting: Family Medicine

## 2018-11-21 ENCOUNTER — Encounter: Payer: Self-pay | Admitting: Family Medicine

## 2018-11-21 NOTE — Progress Notes (Signed)
Letter placed in mail today to pts address.

## 2018-11-21 NOTE — Progress Notes (Signed)
    [facility]  [address] [place], [place] [address] [phone]    November 21, 2018   Patient: Courtney Meza  Date of Birth: June 02, 1986  Date of Visit: 11/21/2018    To Whom It May Concern:  [name] is under my care for the treatment of Multiple Sclerosis. 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 She is aware that she is at an increased risk should she be exposed to Coronavirus and should avoid potential exposure.    Thank you,     Syd Newsome, FNP-C

## 2019-03-21 ENCOUNTER — Encounter: Payer: Self-pay | Admitting: Internal Medicine

## 2019-03-21 ENCOUNTER — Other Ambulatory Visit: Payer: Self-pay

## 2019-03-21 ENCOUNTER — Ambulatory Visit: Payer: Self-pay | Admitting: Internal Medicine

## 2019-03-21 VITALS — BP 132/90 | HR 93 | Temp 98.9°F | Ht 65.2 in | Wt 259.2 lb

## 2019-03-21 DIAGNOSIS — K5904 Chronic idiopathic constipation: Secondary | ICD-10-CM

## 2019-03-21 DIAGNOSIS — Z6841 Body Mass Index (BMI) 40.0 and over, adult: Secondary | ICD-10-CM

## 2019-03-21 DIAGNOSIS — I10 Essential (primary) hypertension: Secondary | ICD-10-CM

## 2019-03-21 MED ORDER — NIFEDIPINE ER OSMOTIC RELEASE 60 MG PO TB24: 60.0000 mg | ORAL_TABLET | Freq: Every day | ORAL | 1 refills | Status: DC

## 2019-03-21 NOTE — Patient Instructions (Signed)
Heart-Healthy Eating Plan °Heart-healthy meal planning includes: °· Eating less unhealthy fats. °· Eating more healthy fats. °· Making other changes in your diet. °Talk with your doctor or a diet specialist (dietitian) to create an eating plan that is right for you. °What is my plan? °Your doctor may recommend an eating plan that includes: °· Total fat: ______% or less of total calories a day. °· Saturated fat: ______% or less of total calories a day. °· Cholesterol: less than _________mg a day. °What are tips for following this plan? °Cooking °Avoid frying your food. Try to bake, boil, grill, or broil it instead. You can also reduce fat by: °· Removing the skin from poultry. °· Removing all visible fats from meats. °· Steaming vegetables in water or broth. °Meal planning ° °· At meals, divide your plate into four equal parts: °? Fill one-half of your plate with vegetables and green salads. °? Fill one-fourth of your plate with whole grains. °? Fill one-fourth of your plate with lean protein foods. °· Eat 4-5 servings of vegetables per day. A serving of vegetables is: °? 1 cup of raw or cooked vegetables. °? 2 cups of raw leafy greens. °· Eat 4-5 servings of fruit per day. A serving of fruit is: °? 1 medium whole fruit. °? ¼ cup of dried fruit. °? ½ cup of fresh, frozen, or canned fruit. °? ½ cup of 100% fruit juice. °· Eat more foods that have soluble fiber. These are apples, broccoli, carrots, beans, peas, and barley. Try to get 20-30 g of fiber per day. °· Eat 4-5 servings of nuts, legumes, and seeds per week: °? 1 serving of dried beans or legumes equals ½ cup after being cooked. °? 1 serving of nuts is ¼ cup. °? 1 serving of seeds equals 1 tablespoon. °General information °· Eat more home-cooked food. Eat less restaurant, buffet, and fast food. °· Limit or avoid alcohol. °· Limit foods that are high in starch and sugar. °· Avoid fried foods. °· Lose weight if you are overweight. °· Keep track of how much salt  (sodium) you eat. This is important if you have high blood pressure. Ask your doctor to tell you more about this. °· Try to add vegetarian meals each week. °Fats °· Choose healthy fats. These include olive oil and canola oil, flaxseeds, walnuts, almonds, and seeds. °· Eat more omega-3 fats. These include salmon, mackerel, sardines, tuna, flaxseed oil, and ground flaxseeds. Try to eat fish at least 2 times each week. °· Check food labels. Avoid foods with trans fats or high amounts of saturated fat. °· Limit saturated fats. °? These are often found in animal products, such as meats, butter, and cream. °? These are also found in plant foods, such as palm oil, palm kernel oil, and coconut oil. °· Avoid foods with partially hydrogenated oils in them. These have trans fats. Examples are stick margarine, some tub margarines, cookies, crackers, and other baked goods. °What foods can I eat? °Fruits °All fresh, canned (in natural juice), or frozen fruits. °Vegetables °Fresh or frozen vegetables (raw, steamed, roasted, or grilled). Green salads. °Grains °Most grains. Choose whole wheat and whole grains most of the time. Rice and pasta, including brown rice and pastas made with whole wheat. °Meats and other proteins °Lean, well-trimmed beef, veal, pork, and lamb. Chicken and turkey without skin. All fish and shellfish. Wild duck, rabbit, pheasant, and venison. Egg whites or low-cholesterol egg substitutes. Dried beans, peas, lentils, and tofu. Seeds and most   nuts. °Dairy °Low-fat or nonfat cheeses, including ricotta and mozzarella. Skim or 1% milk that is liquid, powdered, or evaporated. Buttermilk that is made with low-fat milk. Nonfat or low-fat yogurt. °Fats and oils °Non-hydrogenated (trans-free) margarines. Vegetable oils, including soybean, sesame, sunflower, olive, peanut, safflower, corn, canola, and cottonseed. Salad dressings or mayonnaise made with a vegetable oil. °Beverages °Mineral water. Coffee and tea. Diet  carbonated beverages. °Sweets and desserts °Sherbet, gelatin, and fruit ice. Small amounts of dark chocolate. °Limit all sweets and desserts. °Seasonings and condiments °All seasonings and condiments. °The items listed above may not be a complete list of foods and drinks you can eat. Contact a dietitian for more options. °What foods should I avoid? °Fruits °Canned fruit in heavy syrup. Fruit in cream or butter sauce. Fried fruit. Limit coconut. °Vegetables °Vegetables cooked in cheese, cream, or butter sauce. Fried vegetables. °Grains °Breads that are made with saturated or trans fats, oils, or whole milk. Croissants. Sweet rolls. Donuts. High-fat crackers, such as cheese crackers. °Meats and other proteins °Fatty meats, such as hot dogs, ribs, sausage, bacon, rib-eye roast or steak. High-fat deli meats, such as salami and bologna. Caviar. Domestic duck and goose. Organ meats, such as liver. °Dairy °Cream, sour cream, cream cheese, and creamed cottage cheese. Whole-milk cheeses. Whole or 2% milk that is liquid, evaporated, or condensed. Whole buttermilk. Cream sauce or high-fat cheese sauce. Yogurt that is made from whole milk. °Fats and oils °Meat fat, or shortening. Cocoa butter, hydrogenated oils, palm oil, coconut oil, palm kernel oil. Solid fats and shortenings, including bacon fat, salt pork, lard, and butter. Nondairy cream substitutes. Salad dressings with cheese or sour cream. °Beverages °Regular sodas and juice drinks with added sugar. °Sweets and desserts °Frosting. Pudding. Cookies. Cakes. Pies. Milk chocolate or white chocolate. Buttered syrups. Full-fat ice cream or ice cream drinks. °The items listed above may not be a complete list of foods and drinks to avoid. Contact a dietitian for more information. °Summary °· Heart-healthy meal planning includes eating less unhealthy fats, eating more healthy fats, and making other changes in your diet. °· Eat a balanced diet. This includes fruits and  vegetables, low-fat or nonfat dairy, lean protein, nuts and legumes, whole grains, and heart-healthy oils and fats. °This information is not intended to replace advice given to you by your health care provider. Make sure you discuss any questions you have with your health care provider. °Document Released: 01/17/2012 Document Revised: 09/21/2017 Document Reviewed: 08/25/2017 °Elsevier Patient Education © 2020 [employer] ° °

## 2019-03-22 LAB — CMP14+EGFR
ALT: 10 IU/L (ref 0–32)
AST: 7 IU/L (ref 0–40)
Albumin/Globulin Ratio: 1.3 (ref 1.2–2.2)
Albumin: 3.9 g/dL (ref 3.8–4.8)
Alkaline Phosphatase: 90 IU/L (ref 39–117)
BUN/Creatinine Ratio: 10 (ref 9–23)
BUN: 7 mg/dL (ref 6–20)
Bilirubin Total: 0.3 mg/dL (ref 0.0–1.2)
CO2: 25 mmol/L (ref 20–29)
Calcium: 9 mg/dL (ref 8.7–10.2)
Chloride: 100 mmol/L (ref 96–106)
Creatinine, Ser: 0.69 mg/dL (ref 0.57–1.00)
GFR calc Af Amer: 130 mL/min/{1.73_m2} (ref >59)
GFR calc non Af Amer: 113 mL/min/{1.73_m2} (ref >59)
Globulin, Total: 3 g/dL (ref 1.5–4.5)
Glucose: 86 mg/dL (ref 65–99)
Potassium: 4 mmol/L (ref 3.5–5.2)
Sodium: 140 mmol/L (ref 134–144)
Total Protein: 6.9 g/dL (ref 6.0–8.5)

## 2019-03-23 ENCOUNTER — Other Ambulatory Visit: Payer: Self-pay | Admitting: Internal Medicine

## 2019-03-24 NOTE — Progress Notes (Signed)
Subjective:     Patient ID: Courtney Meza , female    DOB: [birth year] , 33 y.o.   MRN: [id number]   Chief Complaint  Patient presents with  . Hypertension    HPI  She is here today for a bp check. She reports compliance with meds. She is now a traveling nurse, she has been out of town. She now has job in [place]. Unfortunately, does not have insurance today, but she expects to have within 2 weeks. She has been taking labetalol, ran out about a week ago. Has had h/a while on it. Her sx have improved since she has run out of the medication.   Hypertension This is a chronic problem. The current episode started more than 1 year ago. The problem has been gradually improving since onset. The problem is controlled. Pertinent negatives include no blurred vision, chest pain, palpitations or shortness of breath. Risk factors for coronary artery disease include obesity and sedentary lifestyle. The current treatment provides moderate improvement. Compliance problems include exercise.      Past Medical History:  Diagnosis Date  . Hypertension   . Multiple sclerosis ([place])   . Neuromuscular disorder ([place])   . Ovarian cyst, left 12/19/2016  . Vision abnormalities      Family History  Problem Relation Age of Onset  . Diabetes Mother   . Hypertension Mother   . Healthy Father   . Diabetes Maternal Grandmother   . Hypertension Maternal Grandmother   . Liver cancer Maternal Grandfather      Current Outpatient Medications:  .  Armodafinil 250 MG tablet, Take 1/2 to 1 pill po every morning, Disp: 90 tablet, Rfl: 1 .  baclofen (LIORESAL) 10 MG tablet, Take 1 tablet (10 mg total) by mouth 3 (three) times daily., Disp: 270 each, Rfl: 1 .  Cholecalciferol (VITAMIN D3) 5000 units TABS, Take 1 tablet by mouth daily., Disp: , Rfl:  .  hydrochlorothiazide (MICROZIDE) 12.5 MG capsule, Take 1 capsule (12.5 mg total) by mouth daily., Disp: 90 capsule, Rfl: 1 .  Prenatal Multivit-Min-Fe-FA (PRENATAL  VITAMINS PO), Take 1 tablet by mouth daily. , Disp: , Rfl:  .  Dimethyl Fumarate (TECFIDERA) 240 MG CPDR, Take 1 capsule (240 mg total) by mouth 2 (two) times daily. (Patient not taking: Reported on 03/21/2019), Disp: 180 capsule, Rfl: 3 .  NIFEdipine (PROCARDIA XL) 60 MG 24 hr tablet, Take 1 tablet (60 mg total) by mouth daily., Disp: 30 tablet, Rfl: 1   Allergies  Allergen Reactions  . Latex Hives     Review of Systems  Constitutional: Negative.   Eyes: Negative for blurred vision.  Respiratory: Negative.  Negative for shortness of breath.   Cardiovascular: Negative.  Negative for chest pain and palpitations.  Gastrointestinal: Negative.   Neurological: Negative.   Psychiatric/Behavioral: Negative.      Today's Vitals   03/21/19 1603  BP: 132/90  Pulse: 93  Temp: 98.9 F (37.2 C)  TempSrc: Oral  Weight: 259 lb 3.2 oz (117.6 kg)  Height: 5' 5.2" (1.656 m)   Body mass index is 42.87 kg/m.   Objective:  Physical Exam Vitals signs and nursing note reviewed.  Constitutional:      Appearance: Normal appearance.  HENT:     Head: Normocephalic and atraumatic.  Cardiovascular:     Rate and Rhythm: Normal rate and regular rhythm.     Heart sounds: Normal heart sounds.  Pulmonary:     Effort: Pulmonary effort is normal.  Breath sounds: Normal breath sounds.  Skin:    General: Skin is warm.  Neurological:     General: No focal deficit present.     Mental Status: She is alert.  Psychiatric:        Mood and Affect: Mood normal.        Behavior: Behavior normal.         Assessment And Plan:     1. Essential hypertension, benign  Chronic, fair control. She expresses desire to conceive in the future. Therefore, I will avoid ACE-inhibitors and ARBs for now. Since she did not tolerate labetalol, she was given rx Nifedipine to take once daily. She will rto in six weeks for re-evaluation.  She is encouraged to avoid adding salt to her foods.   - CMP14+EGFR  2. Chronic  idiopathic constipation  She was given samples of Linzess 28m daily. If no improvement, she will try the 1420msamples.  She is advised to take upon awakening and to wait an hour prior to eating. She has used this in the past with good results. She will let me know if her sx persist.   3. Class 3 severe obesity due to excess calories with serious comorbidity and body mass index (BMI) of 40.0 to 44.9 in adult [facility] She was congratulated on her 29 pound weight loss and encouraged to keep up the great work. She is encouraged to strive for BMI less than 38 to decrease cardiac risk. She is encouraged to exercise no less than 5 days per week.   RoMaximino GreenlandMD    THE PATIENT IS ENCOURAGED TO PRACTICE SOCIAL DISTANCING DUE TO THE COVID-19 PANDEMIC.

## 2019-03-31 ENCOUNTER — Encounter: Payer: Self-pay | Admitting: Internal Medicine

## 2019-04-04 ENCOUNTER — Ambulatory Visit: Payer: BLUE CROSS/BLUE SHIELD | Admitting: Neurology

## 2019-05-02 ENCOUNTER — Ambulatory Visit: Payer: Self-pay | Admitting: Internal Medicine

## 2019-05-19 ENCOUNTER — Encounter: Payer: Self-pay | Admitting: Internal Medicine

## 2019-05-20 ENCOUNTER — Other Ambulatory Visit: Payer: Self-pay

## 2019-05-20 MED ORDER — NIFEDIPINE ER OSMOTIC RELEASE 60 MG PO TB24: 60.0000 mg | ORAL_TABLET | Freq: Every day | ORAL | 1 refills | Status: DC

## 2019-05-20 MED ORDER — HYDROCHLOROTHIAZIDE 12.5 MG PO CAPS: ORAL_CAPSULE | ORAL | 1 refills | Status: DC

## 2019-05-20 MED ORDER — METOPROLOL TARTRATE 25 MG PO TABS: 25.0000 mg | ORAL_TABLET | Freq: Two times a day (BID) | ORAL | 1 refills | Status: DC

## 2019-05-24 ENCOUNTER — Encounter: Payer: Self-pay | Admitting: Internal Medicine

## 2019-05-25 ENCOUNTER — Telehealth: Payer: Self-pay

## 2019-05-25 NOTE — Telephone Encounter (Signed)
Pt left blood pressure readings via mychart  Blood Pressures for this week.  Tuesday morning 138/96 HR 75  night 148/94 HR 92   Wednesday morning 136/94 HR 83  Night 134/92 HR 81   Thursday morning 144/100 HR 80  Night 130/92 HR 83    Friday morning 136/98 HR 88   I am still taking the metoprolol once a day at bedtime

## 2019-06-24 ENCOUNTER — Ambulatory Visit: Payer: Self-pay | Admitting: Internal Medicine

## 2019-07-23 ENCOUNTER — Other Ambulatory Visit: Payer: Self-pay

## 2019-07-23 MED ORDER — METOPROLOL TARTRATE 25 MG PO TABS: 25.0000 mg | ORAL_TABLET | Freq: Two times a day (BID) | ORAL | 1 refills | Status: DC

## 2019-08-08 ENCOUNTER — Ambulatory Visit (INDEPENDENT_AMBULATORY_CARE_PROVIDER_SITE_OTHER): Payer: 59 | Admitting: Internal Medicine

## 2019-08-08 ENCOUNTER — Encounter: Payer: Self-pay | Admitting: Internal Medicine

## 2019-08-08 ENCOUNTER — Other Ambulatory Visit: Payer: Self-pay

## 2019-08-08 VITALS — BP 128/96 | HR 88 | Temp 98.6°F | Ht 65.2 in | Wt 275.0 lb

## 2019-08-08 DIAGNOSIS — G35 Multiple sclerosis: Secondary | ICD-10-CM | POA: Diagnosis not present

## 2019-08-08 DIAGNOSIS — Z6841 Body Mass Index (BMI) 40.0 and over, adult: Secondary | ICD-10-CM

## 2019-08-08 DIAGNOSIS — I10 Essential (primary) hypertension: Secondary | ICD-10-CM

## 2019-08-08 DIAGNOSIS — F419 Anxiety disorder, unspecified: Secondary | ICD-10-CM | POA: Diagnosis not present

## 2019-08-08 DIAGNOSIS — R002 Palpitations: Secondary | ICD-10-CM

## 2019-08-08 MED ORDER — METOPROLOL TARTRATE 25 MG PO TABS: 25.0000 mg | ORAL_TABLET | Freq: Two times a day (BID) | ORAL | 1 refills | Status: DC

## 2019-08-08 MED ORDER — BUPROPION HCL ER (XL) 150 MG PO TB24: 150.0000 mg | ORAL_TABLET | ORAL | 2 refills | Status: DC

## 2019-08-08 NOTE — Patient Instructions (Signed)

## 2019-08-08 NOTE — Progress Notes (Signed)
This visit occurred during the SARS-CoV-2 public health emergency.  Safety protocols were in place, including screening questions prior to the visit, additional usage of staff PPE, and extensive cleaning of exam room while observing appropriate contact time as indicated for disinfecting solutions.  Subjective:     Patient ID: Courtney Meza , female    DOB: 06-17-1986 , 34 y.o.   MRN: [id number]   Chief Complaint  Patient presents with  . Hypertension    HPI  She is here today for a BP check. She reports compliance with meds.   Hypertension This is a chronic problem. The current episode started more than 1 year ago. The problem has been gradually improving since onset. The problem is uncontrolled. Associated symptoms include palpitations. Pertinent negatives include no chest pain. Risk factors for coronary artery disease include obesity and sedentary lifestyle. Past treatments include beta blockers. The current treatment provides moderate improvement. Compliance problems include exercise.      Past Medical History:  Diagnosis Date  . Hypertension   . Multiple sclerosis ([place])   . Neuromuscular disorder ([place])   . Ovarian cyst, left 12/19/2016  . Vision abnormalities      Family History  Problem Relation Age of Onset  . Diabetes Mother   . Hypertension Mother   . Healthy Father   . Diabetes Maternal Grandmother   . Hypertension Maternal Grandmother   . Liver cancer Maternal Grandfather      Current Outpatient Medications:  .  Armodafinil 250 MG tablet, Take 1/2 to 1 pill po every morning, Disp: 90 tablet, Rfl: 1 .  baclofen (LIORESAL) 10 MG tablet, Take 1 tablet (10 mg total) by mouth 3 (three) times daily., Disp: 270 each, Rfl: 1 .  Cholecalciferol (VITAMIN D3) 5000 units TABS, Take 1 tablet by mouth daily., Disp: , Rfl:  .  hydrochlorothiazide (MICROZIDE) 12.5 MG capsule, TAKE 1 CAPSULE(12.5 MG) BY MOUTH DAILY, Disp: 90 capsule, Rfl: 1 .  metoprolol tartrate (LOPRESSOR)  25 MG tablet, Take 1 tablet (25 mg total) by mouth 2 (two) times daily. (Patient taking differently: Take 25 mg by mouth daily. ), Disp: 30 tablet, Rfl: 1 .  Prenatal Multivit-Min-Fe-FA (PRENATAL VITAMINS PO), Take 1 tablet by mouth daily. , Disp: , Rfl:    Allergies  Allergen Reactions  . Latex Hives     Review of Systems  Constitutional: Negative.   Respiratory: Negative.   Cardiovascular: Positive for palpitations. Negative for chest pain and leg swelling.  Gastrointestinal: Negative.   Neurological: Negative.   Psychiatric/Behavioral: The patient is nervous/anxious.        She reports she has been more anxious lately. She reports she is stressed at work. 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 She reports this is causing her anxiety, thinking their fate may be hers as well. Sx include palpitations without chest pain.      Today's Vitals   08/08/19 1453  BP: (!) 128/96  Pulse: 88  Temp: 98.6 F (37 C)  TempSrc: Oral  Weight: 275 lb (124.7 kg)  Height: 5' 5.2" (1.656 m)   Body mass index is 45.48 kg/m.   Objective:  Physical Exam Vitals and nursing note reviewed.  Constitutional:      Appearance: Normal appearance. She is obese.  HENT:     Head: Normocephalic and atraumatic.  Cardiovascular:     Rate and Rhythm: Normal rate and regular rhythm.  Heart sounds: Normal heart sounds.  Pulmonary:     Effort: Pulmonary effort is normal.     Breath sounds: Normal breath sounds.  Skin:    General: Skin is warm.  Neurological:     General: No focal deficit present.     Mental Status: She is alert.  Psychiatric:        Mood and Affect: Mood normal.        Behavior: Behavior normal.         Assessment And Plan:     1. Essential hypertension, benign  Chronic, uncontrolled. I will increase her metoprolol to twice daily dosing. She is also encouraged to avoid adding salt to her  foods. She agrees to rto in six weeks for re-evaluation.   2. Anxiety  She would like to start pharmacologic therapy to address this issue. We will try Wellbutrin XL 152m once daily, we feel this is best b/c there is less risk of weight gain with this medication.   3. Palpitations  Recurrent. HR under 100 today. She is encouraged to stay well hydrated. I will check labs as listed below. I will make further recommendations once her labs are available for review.   - TSH - Magnesium - BMP8+EGFR - CBC no Diff  4. [place]  Chronic, as per Neurology.   5. Class 3 severe obesity due to excess calories with serious comorbidity and body mass index (BMI) of 45.0 to 49.9 in adult ([facility]  She is encouraged to strive for BMI less than 40 to decrease cardiac risk. She is encouraged to incorporate more exercise into her daily routine.   RMaximino Greenland MD    THE PATIENT IS ENCOURAGED TO PRACTICE SOCIAL DISTANCING DUE TO THE COVID-19 PANDEMIC.

## 2019-08-09 LAB — CBC
Hematocrit: 37.3 % (ref 34.0–46.6)
Hemoglobin: 12.4 g/dL (ref 11.1–15.9)
MCH: 27.4 pg (ref 26.6–33.0)
MCHC: 33.2 g/dL (ref 31.5–35.7)
MCV: 82 fL (ref 79–97)
Platelets: 373 10*3/uL (ref 150–450)
RBC: 4.53 x10E6/uL (ref 3.77–5.28)
RDW: 14 % (ref 11.7–15.4)
WBC: 6.2 10*3/uL (ref 3.4–10.8)

## 2019-08-09 LAB — BMP8+EGFR
BUN/Creatinine Ratio: 18 (ref 9–23)
BUN: 15 mg/dL (ref 6–20)
CO2: 24 mmol/L (ref 20–29)
Calcium: 9.3 mg/dL (ref 8.7–10.2)
Chloride: 99 mmol/L (ref 96–106)
Creatinine, Ser: 0.84 mg/dL (ref 0.57–1.00)
GFR calc Af Amer: 104 mL/min/{1.73_m2} (ref >59)
GFR calc non Af Amer: 90 mL/min/{1.73_m2} (ref >59)
Glucose: 96 mg/dL (ref 65–99)
Potassium: 4.1 mmol/L (ref 3.5–5.2)
Sodium: 137 mmol/L (ref 134–144)

## 2019-08-09 LAB — MAGNESIUM: Magnesium: 1.9 mg/dL (ref 1.6–2.3)

## 2019-08-09 LAB — TSH: TSH: 1.84 u[IU]/mL (ref 0.450–4.500)

## 2019-09-05 ENCOUNTER — Ambulatory Visit: Payer: 59 | Admitting: Internal Medicine

## 2019-09-05 ENCOUNTER — Encounter: Payer: BLUE CROSS/BLUE SHIELD | Admitting: Internal Medicine

## 2019-09-05 ENCOUNTER — Other Ambulatory Visit: Payer: Self-pay

## 2019-09-05 ENCOUNTER — Encounter: Payer: Self-pay | Admitting: Internal Medicine

## 2019-09-05 VITALS — BP 138/76 | HR 84 | Temp 98.5°F | Ht 65.5 in | Wt 278.0 lb

## 2019-09-05 DIAGNOSIS — F419 Anxiety disorder, unspecified: Secondary | ICD-10-CM

## 2019-09-05 DIAGNOSIS — Z6841 Body Mass Index (BMI) 40.0 and over, adult: Secondary | ICD-10-CM

## 2019-09-05 DIAGNOSIS — N920 Excessive and frequent menstruation with regular cycle: Secondary | ICD-10-CM | POA: Diagnosis not present

## 2019-09-05 DIAGNOSIS — I10 Essential (primary) hypertension: Secondary | ICD-10-CM | POA: Diagnosis not present

## 2019-09-05 MED ORDER — BUPROPION HCL ER (XL) 150 MG PO TB24: 150.0000 mg | ORAL_TABLET | ORAL | 2 refills | Status: DC

## 2019-09-05 NOTE — Progress Notes (Signed)
This visit occurred during the SARS-CoV-2 public health emergency.  Safety protocols were in place, including screening questions prior to the visit, additional usage of staff PPE, and extensive cleaning of exam room while observing appropriate contact time as indicated for disinfecting solutions.  Subjective:     Patient ID: Courtney Meza , female    DOB: [birth year] , 34 y.o.   MRN: [id number]   Chief Complaint  Patient presents with  . Hypertension  . Anxiety    HPI  She is here today for bp check.  She reports compliance with metoprolol twice daily. She has not had any issues with the medication.   Additionally, she has been taking Wellbutrin XL for anxiety.  She states her sx have improved.    Hypertension This is a chronic problem. The current episode started more than 1 year ago. The problem has been gradually improving since onset. Associated symptoms include anxiety. Pertinent negatives include no chest pain or palpitations. Compliance problems include exercise.   Anxiety Presents for follow-up visit. Symptoms include nervous/anxious behavior. Patient reports no chest pain, feeling of choking, insomnia or palpitations.       Past Medical History:  Diagnosis Date  . Hypertension   . Multiple sclerosis ([place])   . Neuromuscular disorder ([place])   . Ovarian cyst, left 12/19/2016  . Vision abnormalities      Family History  Problem Relation Age of Onset  . Diabetes Mother   . Hypertension Mother   . Healthy Father   . Diabetes Maternal Grandmother   . Hypertension Maternal Grandmother   . Liver cancer Maternal Grandfather      Current Outpatient Medications:  .  Armodafinil 250 MG tablet, Take 1/2 to 1 pill po every morning, Disp: 90 tablet, Rfl: 1 .  baclofen (LIORESAL) 10 MG tablet, Take 1 tablet (10 mg total) by mouth 3 (three) times daily., Disp: 270 each, Rfl: 1 .  buPROPion (WELLBUTRIN XL) 150 MG 24 hr tablet, Take 1 tablet (150 mg total) by mouth every  morning., Disp: 90 tablet, Rfl: 2 .  Cholecalciferol (VITAMIN D3) 5000 units TABS, Take 1 tablet by mouth daily., Disp: , Rfl:  .  hydrochlorothiazide (MICROZIDE) 12.5 MG capsule, TAKE 1 CAPSULE(12.5 MG) BY MOUTH DAILY, Disp: 90 capsule, Rfl: 1 .  metoprolol tartrate (LOPRESSOR) 25 MG tablet, Take 1 tablet (25 mg total) by mouth 2 (two) times daily., Disp: 180 tablet, Rfl: 1 .  Prenatal Multivit-Min-Fe-FA (PRENATAL VITAMINS PO), Take 1 tablet by mouth daily. , Disp: , Rfl:    Allergies  Allergen Reactions  . Latex Hives     Review of Systems  Constitutional: Negative.   Respiratory: Negative.   Cardiovascular: Negative.  Negative for chest pain and palpitations.  Gastrointestinal: Negative.   Genitourinary: Positive for menstrual problem.       She reports her cycles are heavy. Wants to be referred to new GYN.   Neurological: Negative.   Psychiatric/Behavioral: The patient is nervous/anxious. The patient does not have insomnia.      Today's Vitals   09/05/19 1512  BP: 138/76  Pulse: 84  Temp: 98.5 F (36.9 C)  Weight: 278 lb (126.1 kg)  Height: 5' 5.5" (1.664 m)   Body mass index is 45.56 kg/m.   Objective:  Physical Exam Vitals and nursing note reviewed.  Constitutional:      Appearance: Normal appearance. She is obese.  HENT:     Head: Normocephalic and atraumatic.  Cardiovascular:     Rate and  Rhythm: Normal rate and regular rhythm.     Heart sounds: Normal heart sounds.  Pulmonary:     Effort: Pulmonary effort is normal.     Breath sounds: Normal breath sounds.  Skin:    General: Skin is warm.  Neurological:     General: No focal deficit present.     Mental Status: She is alert.  Psychiatric:        Mood and Affect: Mood normal.        Behavior: Behavior normal.         Assessment And Plan:     1. Essential hypertension, benign  Chronic, improved control.  Patient advised optimal bp is less than 130/80. She is encouraged to incorporate more exercise  into her daily routine. She is advised to start with days she is off. She will rto in 3 months for her next physical examination.   2. Anxiety  Improved. She will continue with current meds. A 90-day rx was sent to the pharmacy. She will rto in 3 months for re-evaluation.   3. Menorrhagia with regular cycle  I will refer her to GYN for further evaluation.   - Ambulatory referral to Gynecology  4. Class 3 severe obesity due to excess calories with serious comorbidity and body mass index (BMI) of 45.0 to 49.9 in adult ([facility])  BMI 45. She is encouraged to strive for BMI less than 40 to decrease cardiac risk. She is encouraged to strive to lose ten pounds prior to her next visit.   Maximino Greenland, MD    THE PATIENT IS ENCOURAGED TO PRACTICE SOCIAL DISTANCING DUE TO THE COVID-19 PANDEMIC.

## 2019-09-05 NOTE — Patient Instructions (Signed)

## 2019-10-09 ENCOUNTER — Encounter: Payer: Self-pay | Admitting: Internal Medicine

## 2019-12-04 ENCOUNTER — Encounter: Payer: Self-pay | Admitting: Internal Medicine

## 2019-12-04 ENCOUNTER — Other Ambulatory Visit: Payer: Self-pay

## 2019-12-04 ENCOUNTER — Ambulatory Visit: Payer: 59 | Admitting: Internal Medicine

## 2019-12-04 VITALS — BP 112/76 | HR 86 | Temp 98.0°F | Ht 65.2 in | Wt 274.8 lb

## 2019-12-04 DIAGNOSIS — I10 Essential (primary) hypertension: Secondary | ICD-10-CM | POA: Diagnosis not present

## 2019-12-04 DIAGNOSIS — Z6841 Body Mass Index (BMI) 40.0 and over, adult: Secondary | ICD-10-CM

## 2019-12-04 DIAGNOSIS — Z0000 Encounter for general adult medical examination without abnormal findings: Secondary | ICD-10-CM

## 2019-12-04 LAB — POCT URINALYSIS DIPSTICK
Bilirubin, UA: NEGATIVE
Glucose, UA: NEGATIVE
Ketones, UA: NEGATIVE
Leukocytes, UA: NEGATIVE
Nitrite, UA: NEGATIVE
Protein, UA: NEGATIVE
Spec Grav, UA: 1.025 (ref 1.010–1.025)
Urobilinogen, UA: 0.2 E.U./dL
pH, UA: 6 (ref 5.0–8.0)

## 2019-12-04 LAB — POCT UA - MICROALBUMIN
Albumin/Creatinine Ratio, Urine, POC: <30
Creatinine, POC: 300 mg/dL
Microalbumin Ur, POC: 30 mg/L

## 2019-12-04 MED ORDER — BUPROPION HCL ER (XL) 150 MG PO TB24: 150.0000 mg | ORAL_TABLET | ORAL | 2 refills | Status: DC

## 2019-12-04 NOTE — Progress Notes (Signed)
This visit occurred during the SARS-CoV-2 public health emergency.  Safety protocols were in place, including screening questions prior to the visit, additional usage of staff PPE, and extensive cleaning of exam room while observing appropriate contact time as indicated for disinfecting solutions.  Subjective:     Patient ID: Courtney Meza , female    DOB: 1986/02/15 , 34 y.o.   MRN: [id number]   Chief Complaint  Patient presents with  . Annual Exam  . Hypertension    HPI  She is here today for a full physical exam. She has yet to schedule an appt with Dr. Charlesetta Garibaldi. She has no specific concerns or complaints at this time. She works at [employer], currently local.   Hypertension This is a chronic problem. The current episode started more than 1 year ago. The problem has been gradually improving since onset. The problem is uncontrolled. Associated symptoms include palpitations. Pertinent negatives include no chest pain. Risk factors for coronary artery disease include obesity and sedentary lifestyle. Past treatments include beta blockers. The current treatment provides moderate improvement. Compliance problems include exercise.      Past Medical History:  Diagnosis Date  . Hypertension   . Multiple sclerosis ([place])   . Neuromuscular disorder ([place])   . Ovarian cyst, left 12/19/2016  . Vision abnormalities      Family History  Problem Relation Age of Onset  . Diabetes Mother   . Hypertension Mother   . Healthy Father   . Diabetes Maternal Grandmother   . Hypertension Maternal Grandmother   . Liver cancer Maternal Grandfather      Current Outpatient Medications:  .  Armodafinil 250 MG tablet, Take 1/2 to 1 pill po every morning, Disp: 90 tablet, Rfl: 1 .  baclofen (LIORESAL) 10 MG tablet, Take 1 tablet (10 mg total) by mouth 3 (three) times daily., Disp: 270 each, Rfl: 1 .  buPROPion (WELLBUTRIN XL) 150 MG 24 hr tablet, Take 1 tablet (150 mg total) by mouth every morning.,  Disp: 90 tablet, Rfl: 2 .  Cholecalciferol (VITAMIN D3) 5000 units TABS, Take 1 tablet by mouth daily., Disp: , Rfl:  .  hydrochlorothiazide (MICROZIDE) 12.5 MG capsule, TAKE 1 CAPSULE(12.5 MG) BY MOUTH DAILY, Disp: 90 capsule, Rfl: 1 .  metoprolol tartrate (LOPRESSOR) 25 MG tablet, Take 1 tablet (25 mg total) by mouth 2 (two) times daily., Disp: 180 tablet, Rfl: 1 .  Prenatal Multivit-Min-Fe-FA (PRENATAL VITAMINS PO), Take 1 tablet by mouth daily. , Disp: , Rfl:    Allergies  Allergen Reactions  . Latex Hives     The patient states she uses none for birth control. Last LMP was Patient's last menstrual period was 11/30/2019.. Negative for Dysmenorrhea Negative for: breast discharge, breast lump(s), breast pain and breast self exam. Associated symptoms include abnormal vaginal bleeding. Pertinent negatives include abnormal bleeding (hematology), anxiety, decreased libido, depression, difficulty falling sleep, dyspareunia, history of infertility, nocturia, sexual dysfunction, sleep disturbances, urinary incontinence, urinary urgency, vaginal discharge and vaginal itching. Diet regular.The patient states her exercise level is    . The patient's tobacco use is:  Social History   Tobacco Use  Smoking Status Never Smoker  Smokeless Tobacco Never Used  . She has been exposed to passive smoke. The patient's alcohol use is:  Social History   Substance and Sexual Activity  Alcohol Use No    Review of Systems  Constitutional: Negative.   HENT: Negative.   Eyes: Negative.   Respiratory: Negative.   Cardiovascular: Positive for  palpitations. Negative for chest pain.  Endocrine: Negative.   Genitourinary: Negative.   Musculoskeletal: Negative.   Skin: Negative.   Allergic/Immunologic: Negative.   Neurological: Negative.   Hematological: Negative.   Psychiatric/Behavioral: Negative.      Today's Vitals   12/04/19 1439  BP: 112/76  Pulse: 86  Temp: 98 F (36.7 C)  TempSrc: Oral   Weight: 274 lb 12.8 oz (124.6 kg)  Height: 5' 5.2" (1.656 m)   Body mass index is 45.45 kg/m.   Objective:  Physical Exam      Assessment And Plan:     1. Routine general medical examination at health care facility  A full exam was performed.  Importance of monthly self breast exams was discussed with the patient. PATIENT IS ADVISED TO GET 30-45 MINUTES REGULAR EXERCISE NO LESS THAN FOUR TO FIVE DAYS PER WEEK - BOTH WEIGHTBEARING EXERCISES AND AEROBIC ARE RECOMMENDED.  SHE IS ADVISED TO FOLLOW A HEALTHY DIET WITH AT LEAST SIX FRUITS/VEGGIES PER DAY, DECREASE INTAKE OF RED MEAT, AND TO INCREASE FISH INTAKE TO TWO DAYS PER WEEK.  MEATS/FISH SHOULD NOT BE FRIED, BAKED OR BROILED IS PREFERABLE.  I SUGGEST WEARING SPF 50 SUNSCREEN ON EXPOSED PARTS AND ESPECIALLY WHEN IN THE DIRECT SUNLIGHT FOR AN EXTENDED PERIOD OF TIME.  PLEASE AVOID FAST FOOD RESTAURANTS AND INCREASE YOUR WATER INTAKE.  - CMP 14+EGFR - CBC no Diff - Lipid panel  2. Essential hypertension, benign  Chronic, well controlled. She will continue with current meds. She is encouraged to avoid adding salt to her foods. EKG performed, NSR w/o acute changes. She will rto in six months for re-evaluation.   - POCT Urinalysis Dipstick (81002) - POCT UA - Microalbumin - EKG 12-Lead  3. Class 3 severe obesity due to excess calories with serious comorbidity and body mass index (BMI) of 45.0 to 49.9 in adult (HCC)  BMI 45. She agrees to referral to [facility] program. She is aware they have a [place] location. Encouraged to exercise at least 30 minutes five days per week.    Maximino Greenland, MD    THE PATIENT IS ENCOURAGED TO PRACTICE SOCIAL DISTANCING DUE TO THE COVID-19 PANDEMIC.

## 2019-12-04 NOTE — Patient Instructions (Signed)
Health Maintenance, Female Adopting a healthy lifestyle and getting preventive care are important in promoting health and wellness. Ask your health care provider about:  The right schedule for you to have regular tests and exams.  Things you can do on your own to prevent diseases and keep yourself healthy. What should I know about diet, weight, and exercise? Eat a healthy diet   Eat a diet that includes plenty of vegetables, fruits, low-fat dairy products, and lean protein.  Do not eat a lot of foods that are high in solid fats, added sugars, or sodium. Maintain a healthy weight Body mass index (BMI) is used to identify weight problems. It estimates body fat based on height and weight. Your health care provider can help determine your BMI and help you achieve or maintain a healthy weight. Get regular exercise Get regular exercise. This is one of the most important things you can do for your health. Most adults should:  Exercise for at least 150 minutes each week. The exercise should increase your heart rate and make you sweat (moderate-intensity exercise).  Do strengthening exercises at least twice a week. This is in addition to the moderate-intensity exercise.  Spend less time sitting. Even light physical activity can be beneficial. Watch cholesterol and blood lipids Have your blood tested for lipids and cholesterol at 36 years of age, then have this test every 5 years. Have your cholesterol levels checked more often if:  Your lipid or cholesterol levels are high.  You are older than 36 years of age.  You are at high risk for heart disease. What should I know about cancer screening? Depending on your health history and family history, you may need to have cancer screening at various ages. This may include screening for:  Breast cancer.  Cervical cancer.  Colorectal cancer.  Skin cancer.  Lung cancer. What should I know about heart disease, diabetes, and high blood  pressure? Blood pressure and heart disease  High blood pressure causes heart disease and increases the risk of stroke. This is more likely to develop in people who have high blood pressure readings, are of African descent, or are overweight.  Have your blood pressure checked: ? Every 3-5 years if you are 18-39 years of age. ? Every year if you are 40 years old or older. Diabetes Have regular diabetes screenings. This checks your fasting blood sugar level. Have the screening done:  Once every three years after age 40 if you are at a normal weight and have a low risk for diabetes.  More often and at a younger age if you are overweight or have a high risk for diabetes. What should I know about preventing infection? Hepatitis B If you have a higher risk for hepatitis B, you should be screened for this virus. Talk with your health care provider to find out if you are at risk for hepatitis B infection. Hepatitis C Testing is recommended for:  Everyone born from 1945 through 1965.  Anyone with known risk factors for hepatitis C. Sexually transmitted infections (STIs)  Get screened for STIs, including gonorrhea and chlamydia, if: ? You are sexually active and are younger than 36 years of age. ? You are older than 36 years of age and your health care provider tells you that you are at risk for this type of infection. ? Your sexual activity has changed since you were last screened, and you are at increased risk for chlamydia or gonorrhea. Ask your health care provider if   you are at risk.  Ask your health care provider about whether you are at high risk for HIV. Your health care provider may recommend a prescription medicine to help prevent HIV infection. If you choose to take medicine to prevent HIV, you should first get tested for HIV. You should then be tested every 3 months for as long as you are taking the medicine. Pregnancy  If you are about to stop having your period (premenopausal) and  you may become pregnant, seek counseling before you get pregnant.  Take 400 to 800 micrograms (mcg) of folic acid every day if you become pregnant.  Ask for birth control (contraception) if you want to prevent pregnancy. Osteoporosis and menopause Osteoporosis is a disease in which the bones lose minerals and strength with aging. This can result in bone fractures. If you are 65 years old or older, or if you are at risk for osteoporosis and fractures, ask your health care provider if you should:  Be screened for bone loss.  Take a calcium or vitamin D supplement to lower your risk of fractures.  Be given hormone replacement therapy (HRT) to treat symptoms of menopause. Follow these instructions at home: Lifestyle  Do not use any products that contain nicotine or tobacco, such as cigarettes, e-cigarettes, and chewing tobacco. If you need help quitting, ask your health care provider.  Do not use street drugs.  Do not share needles.  Ask your health care provider for help if you need support or information about quitting drugs. Alcohol use  Do not drink alcohol if: ? Your health care provider tells you not to drink. ? You are pregnant, may be pregnant, or are planning to become pregnant.  If you drink alcohol: ? Limit how much you use to 0-1 drink a day. ? Limit intake if you are breastfeeding.  Be aware of how much alcohol is in your drink. In the [place] one drink equals one 12 oz bottle of beer (355 mL), one 5 oz glass of wine (148 mL), or one 1 oz glass of hard liquor (44 mL). General instructions  Schedule regular health, dental, and eye exams.  Stay current with your vaccines.  Tell your health care provider if: ? You often feel depressed. ? You have ever been abused or do not feel safe at home. Summary  Adopting a healthy lifestyle and getting preventive care are important in promoting health and wellness.  Follow your health care provider's instructions about healthy  diet, exercising, and getting tested or screened for diseases.  Follow your health care provider's instructions on monitoring your cholesterol and blood pressure. This information is not intended to replace advice given to you by your health care provider. Make sure you discuss any questions you have with your health care provider. Document Revised: 07/11/2018 Document Reviewed: 07/11/2018 Elsevier Patient Education  2020 [employer]  

## 2019-12-05 ENCOUNTER — Encounter: Payer: Self-pay | Admitting: Internal Medicine

## 2019-12-05 LAB — CMP14+EGFR
ALT: 11 IU/L (ref 0–32)
AST: 12 IU/L (ref 0–40)
Albumin/Globulin Ratio: 1.3 (ref 1.2–2.2)
Albumin: 4.1 g/dL (ref 3.8–4.8)
Alkaline Phosphatase: 102 IU/L (ref 39–117)
BUN/Creatinine Ratio: 12 (ref 9–23)
BUN: 9 mg/dL (ref 6–20)
Bilirubin Total: 0.3 mg/dL (ref 0.0–1.2)
CO2: 25 mmol/L (ref 20–29)
Calcium: 9.3 mg/dL (ref 8.7–10.2)
Chloride: 101 mmol/L (ref 96–106)
Creatinine, Ser: 0.78 mg/dL (ref 0.57–1.00)
GFR calc Af Amer: 114 mL/min/{1.73_m2} (ref >59)
GFR calc non Af Amer: 99 mL/min/{1.73_m2} (ref >59)
Globulin, Total: 3.1 g/dL (ref 1.5–4.5)
Glucose: 81 mg/dL (ref 65–99)
Potassium: 4.2 mmol/L (ref 3.5–5.2)
Sodium: 140 mmol/L (ref 134–144)
Total Protein: 7.2 g/dL (ref 6.0–8.5)

## 2019-12-05 LAB — CBC
Hematocrit: 36 % (ref 34.0–46.6)
Hemoglobin: 12 g/dL (ref 11.1–15.9)
MCH: 27.1 pg (ref 26.6–33.0)
MCHC: 33.3 g/dL (ref 31.5–35.7)
MCV: 81 fL (ref 79–97)
Platelets: 350 10*3/uL (ref 150–450)
RBC: 4.43 x10E6/uL (ref 3.77–5.28)
RDW: 14 % (ref 11.7–15.4)
WBC: 5.8 10*3/uL (ref 3.4–10.8)

## 2019-12-05 LAB — LIPID PANEL
Chol/HDL Ratio: 4.1 ratio (ref 0.0–4.4)
Cholesterol, Total: 167 mg/dL (ref 100–199)
HDL: 41 mg/dL (ref >39)
LDL Chol Calc (NIH): 106 mg/dL — ABNORMAL HIGH (ref 0–99)
Triglycerides: 107 mg/dL (ref 0–149)
VLDL Cholesterol Cal: 20 mg/dL (ref 5–40)

## 2019-12-09 ENCOUNTER — Other Ambulatory Visit: Payer: Self-pay | Admitting: Internal Medicine

## 2019-12-09 MED ORDER — NYSTATIN 100000 UNIT/GM EX CREA: 1.0000 "application " | TOPICAL_CREAM | Freq: Two times a day (BID) | CUTANEOUS | 0 refills | Status: DC

## 2020-02-24 ENCOUNTER — Other Ambulatory Visit: Payer: Self-pay | Admitting: Obstetrics and Gynecology

## 2020-02-24 DIAGNOSIS — N979 Female infertility, unspecified: Secondary | ICD-10-CM

## 2020-02-27 ENCOUNTER — Ambulatory Visit
Admission: RE | Admit: 2020-02-27 | Discharge: 2020-02-27 | Disposition: A | Discharge status: Home / Self Care | Payer: BLUE CROSS/BLUE SHIELD | Source: Ambulatory Visit | Attending: Obstetrics and Gynecology | Admitting: Obstetrics and Gynecology

## 2020-02-27 DIAGNOSIS — N979 Female infertility, unspecified: Secondary | ICD-10-CM

## 2020-04-24 ENCOUNTER — Other Ambulatory Visit: Payer: Self-pay | Admitting: Internal Medicine

## 2020-05-21 LAB — HM PAP SMEAR

## 2020-05-25 ENCOUNTER — Telehealth: Payer: Self-pay | Admitting: Family Medicine

## 2020-05-26 ENCOUNTER — Encounter: Payer: Self-pay | Admitting: Family Medicine

## 2020-05-26 ENCOUNTER — Telehealth: Payer: Self-pay | Admitting: Family Medicine

## 2020-05-26 ENCOUNTER — Telehealth (INDEPENDENT_AMBULATORY_CARE_PROVIDER_SITE_OTHER): Payer: 59 | Admitting: Family Medicine

## 2020-05-26 DIAGNOSIS — R5382 Chronic fatigue, unspecified: Secondary | ICD-10-CM | POA: Diagnosis not present

## 2020-05-26 DIAGNOSIS — G35 Multiple sclerosis: Secondary | ICD-10-CM | POA: Diagnosis not present

## 2020-05-26 DIAGNOSIS — M62838 Other muscle spasm: Secondary | ICD-10-CM | POA: Diagnosis not present

## 2020-05-26 MED ORDER — BACLOFEN 10 MG PO TABS: 10.0000 mg | ORAL_TABLET | Freq: Three times a day (TID) | ORAL | 0 refills | Status: DC

## 2020-05-26 MED ORDER — ARMODAFINIL 250 MG PO TABS
ORAL_TABLET | ORAL | 0 refills | Status: DC
Start: 2020-05-26 — End: 2020-09-15

## 2020-05-26 NOTE — Telephone Encounter (Signed)
Error

## 2020-05-26 NOTE — Telephone Encounter (Signed)
[facility] pending

## 2020-05-26 NOTE — Progress Notes (Signed)
PATIENT: Courtney Meza DOB: 07/02/1986  REASON FOR VISIT: follow up HISTORY FROM: patient  Virtual Visit via Telephone Note  I connected with Phinley Vandivier on 05/26/20 at 11:00 AM EDT by telephone and verified that I am speaking with the correct person using two identifiers.   I discussed the limitations, risks, security and privacy concerns of performing an evaluation and management service by telephone and the availability of in person appointments. I also discussed with the patient that there may be a patient responsible charge related to this service. The patient expressed understanding and agreed to proceed.   History of Present Illness:  05/26/20 Courtney Meza is a 34 y.o. female here today for follow up for MS. She was last seen 09/2018. She was restarted on Tecfidera following a lapse in insurance coverage resulting in being off DMT for 3 months. Last MR brain and cervical spine 10/2017. Last chemistry with PCP in 11/2019 normal. CBC no diff normal.   She reports that she restarted Tecfidera in 09/2018. She was frightened by Covid and being on an immunosuppresant so she discontinued Tecfidera around 01/2019.   She denies any new or worsening symptoms. 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 She is not schedule to be home until mid November.   She was previously taking armodafinil as needed for MS fatigue. She usually takes it 2-3 times a week.   She was taking baclofen 2-3 times daily for neck and back spasms. Symptoms worsen during colder months. She has been out of medication since 09/2019.   Mood is stable on Wellbutrin, managed by PCP.    History (copied from my note on 10/02/2018)  Courtney Meza is a 34 y.o. female here today for follow up for MS. She reports that she lost her insurance about 3 months ago and had to stop Tecfidera. She has not taken medication since this time. She has not had any new or worrisome symptoms. She She  feels that MS is stable. She has not noticed any changes in vision. She does have some urinary frequency but does not feel this requires treatment at this time. No incontinence. No changes in gait. No falls. She stays fatigued. She feels that armodafinil helps with this. She usually takes it about 2-3 times a week.  She is feeling well and without complaints today.   HISTORY: (copied from Dr Garth Bigness note on 11/07/2017) Courtney Meza is a31 y.o.woman with MS diagnosed early 2018.   Update 11/07/2017: She is noting some left arm weakness and heaviness seems worse since starting Tecfidera.There is also some tingling.There have not been any definiteexacerbations since her last visit. She generally tolerates Tecfidera well but will have mild flushing at times. She notes no change in gait. She notes no change in bladder function. Balance is fine. Vision is stable with poor right acuity and color vision since optic neuritis in 2006.   She is noting a lot of fatigue. She works nights and shifts her schedule on days off. She often needs a nap. Armodafinil helps her daytime sleepiness. She has some anxiety but not depression. Sometimes she feels her chest gets tight associated with nervousness. If distracted, she feels better.    Update 06/07/2017: She feels her MS is mostly stable but she has had a couple episodes of 24-36 hours of left hand tingling and numbness with some 'jumping'. . She is on Tecfidera since March 2018. She tolerates it well. Occasionally she will have some flushing but  this is not a problem. Denies GI issues. She had one blood test where the white blood cells were low and the neutrophils were also low at 0.7 but a repeat test a month later was fine. Her last lymphocyte count was 1.4.   Gait is doing well. She notes mild stable left arm weakness. She has no numbness now in hands or legs. Bladder is fine. Vision is worse on her right but  unchanged. Colors are desaturated on the right. She also had optic neuritis in 2006 (but MRI had no white matter foci at that time).  She notes more fatigue. Of note, she works night shifts (x last 13 years) She sleeps well but usually needs a long nap daily. She sleeps 11 hours on her off days. She denies depression or anxiety. Her cognition is fine.   ___________________________________ From 01/09/2017: MS: She started Tecfidera 09/2016 and is tolerating it well. She has not had any exacerbations. She did note mild left arm symptoms (slightly weaker) that started after the last visit (but before Tecfidera was started) but it improved some a few weeks later. A month ago we checked a blood count and her WBC was reduced and the neutrophil count was reduced at 0.7. We repeated it 2 weeks ago and they white blood cell count and neutrophils are back to normal.  Gait/strength/sensatrion: She feels her gait does well. She denies any numbness or weakness in her legs. She notes numbness in the left arm since last year that became worse in March 2018 but then returned back to baseline.  Bladder: She reports some urinary urgency and frequency. She denies incontinence. She has nocturia couple times a night.   Vision: She notes mild reduced vision out of the right eye. She denies any changes in color vision.   Fatigue/sleep: She has noted fatigue over the past 2 years. She is uncertain if this is due to her work as she works night shifts. She sleeps well some nights but has some troule falling asleep during the day. She does not snore.  Mood/Cognition She denies any difficulty with mood or cognition.  Low Vit D. She had low vitamin D in the past and was on 50,000 units weekly for a while and now takes over-the-counter supplements.  MS History: In April 2006, while driving home she had the sudden onset of reduced right vision like she was looking through a heavy fog.  Vision improved over the next year but not to baseline and has been stable since.. Later in 2006, she had the onset of right facial weakness. She had two weeks of facial twitching and then had facial weakness that seemed to develop over minutes to one hour. She was told she had Bell's palsy. She is uncertain whether she had complete loss of strength in the upper face. She had no other major symptoms over the next decade. However, last year she had numbness in the left arm over a couple weeks The 2006 MRI showed pituitary enlargement. Prolactin levels were recently elevated and she had a follow-up MRI showing stable pituitary enlargement and a pars intermedius cyst. On the 09/23/2016 MRI, she had multiple white matter lesions, periventricular >juxtacortical, a left midbrain and right pons lesion. Brain lesions were not present on the previous MRI.   MS History: In April 2006, while driving home she had the sudden onset of reduced right vision like she was looking through a heavy fog. Vision improved over the next year but not to baseline and  has been stable since.. Later in 2006, she had the onset of right facial weakness. She had two weeks of facial twitching and then had facial weakness that seemed to develop over minutes to one hour. She was told she had Bell's palsy. She is uncertain whether she had complete loss of strength in the upper face. She had no other major symptoms over the next decade. However, last year she had numbness in the left arm over a couple weeks The 2006 MRI showed pituitary enlargement. Prolactin levels were recently elevated and she had a follow-up MRI showing stable pituitary enlargement and a pars intermedius cyst. On the 09/23/2016 MRI, she had multiple white matter lesions, periventricular >juxtacortical, a left midbrain and right pons lesion. Brain lesions were not present on the previous MRI.   Data: The MRI of the brain in October 30, 2004  did not show any brain lesions. However, the second MRI performed 11/07/2004 appeared to show diffusion weighted hyperintensity in the right optic nerve as well as enhancement of the right optic nervenot present on the original 10/30/2004 MRI. The MRI from 09/23/2016 shows multiple T2/FLAIR hyperintense foci in the hemispheres, predominantly in the periventricular white matter but also in the juxtacortical white matter. Additionally there are left midbrain and right pontine lesions. There is questionable optic nerve enhancement.    Observations/Objective:  Generalized: Well developed, in no acute distress  Mentation: Alert oriented to time, place, history taking. Follows all commands speech and language fluent Motor: able to move all extremities without obvious difficulty. Video is very dark so I am unable to fully visualize. She walks without difficulty.    Assessment and Plan:  34 y.o. year old female  has a past medical history of Hypertension, Multiple sclerosis ([place]), Neuromuscular disorder ([place]), Ovarian cyst, left (12/19/2016), and Vision abnormalities. here with    ICD-10-CM   1. Multiple sclerosis ([place])  G35 CBC with Differential/Platelets    CMP    MR BRAIN W WO CONTRAST    MR CERVICAL SPINE W WO CONTRAST  2. Muscle spasms of both lower extremities  M62.838 baclofen (LIORESAL) 10 MG tablet  3. Chronic fatigue  R53.82 baclofen (LIORESAL) 10 MG tablet   Ms. Brackin presents today for follow-up of relapsing remitting MS.  She was last seen in March 2020 but reports discontinuing Tecfidera about a year ago.  We do not have updated labs or imaging.  Fortunately, she has had no new or exacerbating symptoms.  I will refill baclofen and armodafinil for a 1-month supply.  I have advised that she come into the office next month for updated labs. I will order MR brian and cervical spine for monitoring. We will have her sign a restart form for dimethyl fumerate. May consider Vumerity  pending insurance coverage.  Healthy lifestyle habits encouraged.  She will return to the office in 3 months for a full neuro examination and face-to-face visit.     Orders Placed This Encounter  Procedures  . MR BRAIN W WO CONTRAST    Standing Status:   Future    Standing Expiration Date:   05/26/2021    Order Specific Question:   If indicated for the ordered procedure, I authorize the administration of contrast media per Radiology protocol    Answer:   Yes    Order Specific Question:   What is the patient's sedation requirement?    Answer:   No Sedation    Order Specific Question:   Does the patient have a pacemaker  or implanted devices?    Answer:   No    Order Specific Question:   Preferred imaging location?    Answer:   Internal  . MR CERVICAL SPINE W WO CONTRAST    Standing Status:   Future    Standing Expiration Date:   05/26/2021    Order Specific Question:   If indicated for the ordered procedure, I authorize the administration of contrast media per Radiology protocol    Answer:   Yes    Order Specific Question:   What is the patient's sedation requirement?    Answer:   No Sedation    Order Specific Question:   Does the patient have a pacemaker or implanted devices?    Answer:   No    Order Specific Question:   Preferred imaging location?    Answer:   Internal  . CBC with Differential/Platelets    Standing Status:   Future    Standing Expiration Date:   05/26/2021  . CMP    Standing Status:   Future    Standing Expiration Date:   05/26/2021    Meds ordered this encounter  Medications  . baclofen (LIORESAL) 10 MG tablet    Sig: Take 1 tablet (10 mg total) by mouth 3 (three) times daily.    Dispense:  270 each    Refill:  0    For the first week or so pt. will take 1/2 tab tid    Order Specific Question:   Supervising Provider    Answer:   Melvenia Beam [id number]  . Armodafinil 250 MG tablet    Sig: Take 1/2 to 1 pill po every morning    Dispense:  90 tablet     Refill:  0    Order Specific Question:   Supervising Provider    Answer:   Melvenia Beam [[id number]]     Follow Up Instructions:  I discussed the assessment and treatment plan with the patient. The patient was provided an opportunity to ask questions and all were answered. The patient agreed with the plan and demonstrated an understanding of the instructions.   The patient was advised to call back or seek an in-person evaluation if the symptoms worsen or if the condition fails to improve as anticipated.  I provided 20 minutes of non-face-to-face time during this encounter.  Patient is located at her place of residence during my chart visit.  Provider is in the office.   Debbora Presto, NP

## 2020-05-26 NOTE — Progress Notes (Signed)
I have read the note, and I agree with the clinical assessment and plan.  Trell Secrist A. Lekha Dancer, MD, PhD, FAAN Certified in Neurology, Clinical Neurophysiology, Sleep Medicine, Pain Medicine and Neuroimaging  [facility] [address], [address] [place], [place] [address] [phone]  

## 2020-06-01 ENCOUNTER — Telehealth: Payer: Self-pay

## 2020-06-01 NOTE — Telephone Encounter (Signed)
A PA for Armodafinil 250mg  sent via CMM Key: [id number] - PA Case ID: [id number] - Rx #: [id number]  Status Pending   [facility] has received your information, and the request will be reviewed. You may close this dialog, return to your dashboard, and perform other tasks.  You will receive an electronic determination in CoverMyMeds. You can see the latest determination by locating this request on your dashboard or by reopening this request. You will also receive a faxed copy of the determination. If you have any questions please contact [employer] at [phone].

## 2020-06-04 NOTE — Telephone Encounter (Signed)
no to the covid questions MR Brain w/wo contrast & MR Cervical spine w/wo contrast [place]: [phone] & [phone] (exp. 05/26/20 to 08/25/19). Patient is scheduled at [facility] for 06/16/20.

## 2020-06-08 ENCOUNTER — Telehealth: Payer: Self-pay | Admitting: *Deleted

## 2020-06-08 NOTE — Telephone Encounter (Signed)
Noted to have pt make 3-4 revisit with Dr. Felecia Shelling.  Sent mychart message to schedule next available.

## 2020-06-09 ENCOUNTER — Telehealth: Payer: 59 | Admitting: Internal Medicine

## 2020-06-15 NOTE — Telephone Encounter (Signed)
This request has received a favorable outcome and is approved.  PA Case: [id number], Status: Approved, Coverage Starts on: 06/03/2020 12:00:00 AM, Coverage Ends on: 06/03/2021 12:00:00 AM

## 2020-06-16 ENCOUNTER — Ambulatory Visit: Payer: 59

## 2020-06-16 ENCOUNTER — Other Ambulatory Visit: Payer: Self-pay

## 2020-06-16 ENCOUNTER — Other Ambulatory Visit (INDEPENDENT_AMBULATORY_CARE_PROVIDER_SITE_OTHER): Payer: Self-pay

## 2020-06-16 DIAGNOSIS — G35 Multiple sclerosis: Secondary | ICD-10-CM

## 2020-06-16 DIAGNOSIS — Z0289 Encounter for other administrative examinations: Secondary | ICD-10-CM

## 2020-06-16 MED ORDER — GADOBENATE DIMEGLUMINE 529 MG/ML IV SOLN
20.0000 mL | Freq: Once | INTRAVENOUS | Status: AC | PRN
  Administered 2020-06-16: 20 mL via INTRAVENOUS

## 2020-06-17 ENCOUNTER — Telehealth: Payer: Self-pay

## 2020-06-17 LAB — COMPREHENSIVE METABOLIC PANEL
ALT: 11 IU/L (ref 0–32)
AST: 10 IU/L (ref 0–40)
Albumin/Globulin Ratio: 1.1 — ABNORMAL LOW (ref 1.2–2.2)
Albumin: 4 g/dL (ref 3.8–4.8)
Alkaline Phosphatase: 95 IU/L (ref 44–121)
BUN/Creatinine Ratio: 10 (ref 9–23)
BUN: 9 mg/dL (ref 6–20)
Bilirubin Total: <0.2 mg/dL (ref 0.0–1.2)
CO2: 27 mmol/L (ref 20–29)
Calcium: 9.6 mg/dL (ref 8.7–10.2)
Chloride: 101 mmol/L (ref 96–106)
Creatinine, Ser: 0.9 mg/dL (ref 0.57–1.00)
GFR calc Af Amer: 95 mL/min/{1.73_m2} (ref >59)
GFR calc non Af Amer: 83 mL/min/{1.73_m2} (ref >59)
Globulin, Total: 3.6 g/dL (ref 1.5–4.5)
Glucose: 84 mg/dL (ref 65–99)
Potassium: 4.8 mmol/L (ref 3.5–5.2)
Sodium: 138 mmol/L (ref 134–144)
Total Protein: 7.6 g/dL (ref 6.0–8.5)

## 2020-06-17 LAB — CBC WITH DIFFERENTIAL/PLATELET
Basophils Absolute: 0 10*3/uL (ref 0.0–0.2)
Basos: 1 %
EOS (ABSOLUTE): 0.2 10*3/uL (ref 0.0–0.4)
Eos: 5 %
Hematocrit: 37.8 % (ref 34.0–46.6)
Hemoglobin: 12.2 g/dL (ref 11.1–15.9)
Immature Grans (Abs): 0 10*3/uL (ref 0.0–0.1)
Immature Granulocytes: 0 %
Lymphocytes Absolute: 1.8 10*3/uL (ref 0.7–3.1)
Lymphs: 44 %
MCH: 25.9 pg — ABNORMAL LOW (ref 26.6–33.0)
MCHC: 32.3 g/dL (ref 31.5–35.7)
MCV: 80 fL (ref 79–97)
Monocytes Absolute: 0.6 10*3/uL (ref 0.1–0.9)
Monocytes: 15 %
Neutrophils Absolute: 1.4 10*3/uL (ref 1.4–7.0)
Neutrophils: 35 %
Platelets: 379 10*3/uL (ref 150–450)
RBC: 4.71 x10E6/uL (ref 3.77–5.28)
RDW: 13.6 % (ref 11.7–15.4)
WBC: 4 10*3/uL (ref 3.4–10.8)

## 2020-06-17 NOTE — Telephone Encounter (Signed)
Can you see if she signed start form when she came in for labs. Per our conversation during video visit, she would need to come get labs and sign form. I think she came last week when I was out and not sure form was signed. Can you check on this for me please? TY.

## 2020-06-17 NOTE — Telephone Encounter (Signed)
I called pt and she did not sign any form.  Had labs done and results called to her.  I relayed that will try to get her on dimethyl fumarate (generic for tecfidera) and if not able will send to vumerity.  She will sign vumerity, (no form for dimethyl fumarate).  Will try [facility] for dimethyl fumarte fi they will cover at low cost.

## 2020-06-17 NOTE — Telephone Encounter (Addendum)
Courtney Meza is a 34 y.o. female was contacted and notified of the message below.  Pt would like to know if she is going to start back on the Tecfidera since her labs are back.  ----- Message from Debbora Presto, NP sent at 06/17/2020  9:56 AM EST ----- Labs look ok. We will continue current treatment plan.

## 2020-06-18 NOTE — Telephone Encounter (Signed)
I LMVM for pt that did MRI cervical had one new MS lesion not seen on last MRI, all other stable.  Glad she was in to sign p/w will prceed forward with DMT.  She is to call back if questions.

## 2020-06-18 NOTE — Telephone Encounter (Signed)
-----   Message from Debbora Presto, NP sent at 06/18/2020  7:46 AM EST ----- Please let her know that Dr Felecia Shelling has reviewed her MRI of cervical spine and brain. There was 1 new lesion in the cervical spine not seen on last MRI. All other lesions were stable. I would like to get her back on DMT as soon as we can.

## 2020-06-22 MED ORDER — DIMETHYL FUMARATE 120 & 240 MG PO MISC: ORAL | 0 refills | Status: DC

## 2020-06-24 ENCOUNTER — Encounter: Payer: Self-pay | Admitting: Family Medicine

## 2020-06-29 MED ORDER — DIMETHYL FUMARATE 240 MG PO CPDR: 240.0000 mg | DELAYED_RELEASE_CAPSULE | Freq: Two times a day (BID) | ORAL | 10 refills | Status: DC

## 2020-06-29 NOTE — Telephone Encounter (Signed)
Spoke to [employer], Software engineer at [employer] for dimethyl fumarate clarification on medication.  Will be using a starter pack, take 120mg  po bid for 7 days then 240mg  po bid total fo 14 caps of 120 and 46 caps of 240mg .  Will need refills of 240mg  after that.

## 2020-07-02 NOTE — Progress Notes (Signed)
PA submitted for Dimethyl Fumarate Starter Pack 120mg  & 240mg Dimethyl Fumarate Starter Pack 120mg  & 240mg  on Cover my Meds. There is a 24/72hr turn around. PA ID #: [id number] Key: [id number] Status: PENDING  [facility] has received your information, and the request will be reviewed. You may close this dialog, return to your dashboard, and perform other tasks.  You will receive an electronic determination in CoverMyMeds. You can see the latest determination by locating this request on your dashboard or by reopening this request. You will also receive a faxed copy of the determination. If you have any questions please contact [facility] at [phone].  If you need assistance, please chat with CoverMyMeds or call us at [phone].

## 2020-07-06 NOTE — Progress Notes (Signed)
PA Case ID: [id number] Status: Denied. Notification: Completed

## 2020-07-23 ENCOUNTER — Ambulatory Visit (INDEPENDENT_AMBULATORY_CARE_PROVIDER_SITE_OTHER): Payer: 59 | Admitting: Nurse Practitioner

## 2020-07-23 ENCOUNTER — Other Ambulatory Visit: Payer: Self-pay

## 2020-07-23 ENCOUNTER — Encounter: Payer: Self-pay | Admitting: Nurse Practitioner

## 2020-07-23 ENCOUNTER — Ambulatory Visit: Payer: 59 | Admitting: Internal Medicine

## 2020-07-23 VITALS — BP 138/70 | HR 94 | Temp 97.7°F | Ht 66.8 in | Wt 274.6 lb

## 2020-07-23 DIAGNOSIS — Z1159 Encounter for screening for other viral diseases: Secondary | ICD-10-CM | POA: Diagnosis not present

## 2020-07-23 DIAGNOSIS — I10 Essential (primary) hypertension: Secondary | ICD-10-CM

## 2020-07-23 DIAGNOSIS — Z6841 Body Mass Index (BMI) 40.0 and over, adult: Secondary | ICD-10-CM | POA: Diagnosis not present

## 2020-07-23 MED ORDER — HYDROCHLOROTHIAZIDE 12.5 MG PO CAPS: ORAL_CAPSULE | ORAL | 1 refills | Status: DC

## 2020-07-23 NOTE — Patient Instructions (Signed)

## 2020-07-23 NOTE — Progress Notes (Signed)
I,Yamilka Roman [employer] as a Education administrator for [employer], FNP.,have documented all relevant documentation on the behalf of Minette Brine, FNP,as directed by  Minette Brine, FNP while in the presence of Minette Brine, [place]. This visit occurred during the SARS-CoV-2 public health emergency.  Safety protocols were in place, including screening questions prior to the visit, additional usage of staff PPE, and extensive cleaning of exam room while observing appropriate contact time as indicated for disinfecting solutions.  Subjective:     Patient ID: Courtney Meza , female    DOB: 09/14/1985 , 34 y.o.   MRN: [id number]   Chief Complaint  Patient presents with  . Hypertension    HPI  Patient here for a blood pressure f/u. She has no concerns at this time  BP Readings from Last 3 Encounters: 07/23/20 : 138/70 12/04/19 : 112/76 09/05/19 : 138/76   Wt Readings from Last 3 Encounters: 07/23/20 : 274 lb 9.6 oz (124.6 kg) 12/04/19 : 274 lb 12.8 oz (124.6 kg) 09/05/19 : 278 lb (126.1 kg)  She reports she had increased in her weight to 295 lbs and started eating a keto diet and lost weight. She started on December 7th.    Hypertension This is a chronic problem. The current episode started more than 1 year ago. The problem has been waxing and waning since onset. The problem is controlled. Pertinent negatives include no anxiety. There are no associated agents to hypertension. Risk factors for coronary artery disease include obesity and sedentary lifestyle. Past treatments include diuretics and beta blockers. There are no compliance problems.  There is no history of angina. There is no history of chronic renal disease.     Past Medical History:  Diagnosis Date  . Hypertension   . Multiple sclerosis ([place])   . Neuromuscular disorder ([place])   . Ovarian cyst, left 12/19/2016  . Vision abnormalities      Family History  Problem Relation Age of Onset  . Diabetes Mother   . Hypertension Mother    . Healthy Father   . Diabetes Maternal Grandmother   . Hypertension Maternal Grandmother   . Liver cancer Maternal Grandfather      Current Outpatient Medications:  .  Armodafinil 250 MG tablet, Take 1/2 to 1 pill po every morning, Disp: 90 tablet, Rfl: 0 .  baclofen (LIORESAL) 10 MG tablet, Take 1 tablet (10 mg total) by mouth 3 (three) times daily., Disp: 270 each, Rfl: 0 .  buPROPion (WELLBUTRIN XL) 150 MG 24 hr tablet, Take 1 tablet (150 mg total) by mouth every morning., Disp: 90 tablet, Rfl: 2 .  Cholecalciferol (VITAMIN D3) 5000 units TABS, Take 1 tablet by mouth daily., Disp: , Rfl:  .  Dimethyl Fumarate 120 & 240 MG MISC, Take 159m twice daily for 7 days  then increase 2491mtwice daily. (use 12090maps then after first month, then use 240m44mps., Disp: 106 each, Rfl: 0 .  Dimethyl Fumarate 240 MG CPDR, Take 1 capsule (240 mg total) by mouth in the morning and at bedtime., Disp: 60 capsule, Rfl: 10 .  hydrochlorothiazide (MICROZIDE) 12.5 MG capsule, TAKE 1 CAPSULE(12.5 MG) BY MOUTH DAILY, Disp: 90 capsule, Rfl: 1 .  metoprolol tartrate (LOPRESSOR) 25 MG tablet, TAKE 1 TABLET(25 MG) BY MOUTH TWICE DAILY, Disp: 180 tablet, Rfl: 1 .  nystatin cream (MYCOSTATIN), Apply 1 application topically 2 (two) times daily., Disp: 30 g, Rfl: 0 .  pantoprazole (PROTONIX) 40 MG tablet, TAKE 1 TABLET BY MOUTH EVERY DAY, Disp:  90 tablet, Rfl: 1 .  Prenatal Multivit-Min-Fe-FA (PRENATAL VITAMINS PO), Take 1 tablet by mouth daily. , Disp: , Rfl:    Allergies  Allergen Reactions  . Latex Hives     Review of Systems  Constitutional: Negative.   HENT: Negative.   Eyes: Negative.   Respiratory: Negative.   Cardiovascular: Negative.   Gastrointestinal: Negative.   Endocrine: Negative.   Genitourinary: Negative.   Musculoskeletal: Negative.   Skin: Negative.   Neurological: Negative.   Hematological: Negative.   Psychiatric/Behavioral: Negative.      Today's Vitals   07/23/20 1408  BP:  138/70  Pulse: 94  Temp: 97.7 F (36.5 C)  TempSrc: Oral  Weight: 274 lb 9.6 oz (124.6 kg)  Height: 5' 6.8" (1.697 m)   Body mass index is 43.27 kg/m.  Wt Readings from Last 3 Encounters:  07/23/20 274 lb 9.6 oz (124.6 kg)  12/04/19 274 lb 12.8 oz (124.6 kg)  09/05/19 278 lb (126.1 kg)     Objective:  Physical Exam Vitals reviewed.  Constitutional:      General: She is not in acute distress.    Appearance: She is well-developed. She is obese.  HENT:     Head: Normocephalic and atraumatic.  Eyes:     Pupils: Pupils are equal, round, and reactive to light.  Cardiovascular:     Rate and Rhythm: Normal rate and regular rhythm.     Pulses: Normal pulses.     Heart sounds: Normal heart sounds. No murmur heard.   Pulmonary:     Effort: Pulmonary effort is normal. No respiratory distress.     Breath sounds: Normal breath sounds. No wheezing.  Musculoskeletal:        General: Normal range of motion.  Skin:    General: Skin is warm and dry.     Capillary Refill: Capillary refill takes less than 2 seconds.  Neurological:     General: No focal deficit present.     Mental Status: She is alert and oriented to person, place, and time.     Cranial Nerves: No cranial nerve deficit.  Psychiatric:        Mood and Affect: Mood normal.        Behavior: Behavior normal.        Thought Content: Thought content normal.        Judgment: Judgment normal.         Assessment And Plan:     1. Essential hypertension, benign  Chronic, blood pressure is fairly controlled  Will check GFR   Encouraged to exercise at least 150 minutes a week - hydrochlorothiazide (MICROZIDE) 12.5 MG capsule; TAKE 1 CAPSULE(12.5 MG) BY MOUTH DAILY  Dispense: 90 capsule; Refill: 1 - BMP8+eGFR  2. Class 3 severe obesity due to excess calories with serious comorbidity and body mass index (BMI) of 40.0 to 44.9 in adult [facility])  Chronic  Discussed healthy diet and regular exercise options   She is  encouraged to strive for BMI less than 30 to decrease cardiac risk. Advised to aim for at least 150 minutes of exercise per week.  3. Encounter for hepatitis C screening test for low risk patient  Will check Hepatitis C screening due to recent recommendations to screen all adults 18 years and older - Hepatitis C antibody  Will get records from Dr. Charlesetta Garibaldi for her most recent PAP  Patient was given opportunity to ask questions. Patient verbalized understanding of the plan and was able to repeat key elements  of the plan. All questions were answered to their satisfaction.  Minette Brine, FNP     I, Minette Brine, FNP, have reviewed all documentation for this visit. The documentation on 07/23/20 for the exam, diagnosis, procedures, and orders are all accurate and complete.   THE PATIENT IS ENCOURAGED TO PRACTICE SOCIAL DISTANCING DUE TO THE COVID-19 PANDEMIC.

## 2020-07-24 LAB — BMP8+EGFR
BUN/Creatinine Ratio: 13 (ref 9–23)
BUN: 13 mg/dL (ref 6–20)
CO2: 25 mmol/L (ref 20–29)
Calcium: 9.7 mg/dL (ref 8.7–10.2)
Chloride: 102 mmol/L (ref 96–106)
Creatinine, Ser: 0.99 mg/dL (ref 0.57–1.00)
GFR calc Af Amer: 85 mL/min/{1.73_m2} (ref >59)
GFR calc non Af Amer: 74 mL/min/{1.73_m2} (ref >59)
Glucose: 101 mg/dL — ABNORMAL HIGH (ref 65–99)
Potassium: 4.1 mmol/L (ref 3.5–5.2)
Sodium: 140 mmol/L (ref 134–144)

## 2020-07-24 LAB — HEPATITIS C ANTIBODY: Hep C Virus Ab: <0.1 s/co ratio (ref 0.0–0.9)

## 2020-07-29 ENCOUNTER — Encounter: Payer: Self-pay | Admitting: Internal Medicine

## 2020-09-07 ENCOUNTER — Encounter: Payer: Self-pay | Admitting: Family Medicine

## 2020-09-07 ENCOUNTER — Other Ambulatory Visit: Payer: Self-pay | Admitting: *Deleted

## 2020-09-07 DIAGNOSIS — G35 Multiple sclerosis: Secondary | ICD-10-CM

## 2020-09-07 MED ORDER — DIMETHYL FUMARATE 240 MG PO CPDR: 240.0000 mg | DELAYED_RELEASE_CAPSULE | Freq: Two times a day (BID) | ORAL | 10 refills | Status: DC

## 2020-09-15 ENCOUNTER — Ambulatory Visit (INDEPENDENT_AMBULATORY_CARE_PROVIDER_SITE_OTHER): Payer: 59 | Admitting: Neurology

## 2020-09-15 ENCOUNTER — Encounter: Payer: Self-pay | Admitting: Neurology

## 2020-09-15 ENCOUNTER — Telehealth: Payer: Self-pay | Admitting: *Deleted

## 2020-09-15 VITALS — BP 145/102 | HR 87 | Ht 66.8 in | Wt 289.0 lb

## 2020-09-15 DIAGNOSIS — M62838 Other muscle spasm: Secondary | ICD-10-CM | POA: Diagnosis not present

## 2020-09-15 DIAGNOSIS — R5382 Chronic fatigue, unspecified: Secondary | ICD-10-CM | POA: Diagnosis not present

## 2020-09-15 DIAGNOSIS — M6289 Other specified disorders of muscle: Secondary | ICD-10-CM

## 2020-09-15 DIAGNOSIS — G35D Multiple sclerosis, unspecified: Secondary | ICD-10-CM

## 2020-09-15 DIAGNOSIS — G35 Multiple sclerosis: Secondary | ICD-10-CM | POA: Diagnosis not present

## 2020-09-15 DIAGNOSIS — G4726 Circadian rhythm sleep disorder, shift work type: Secondary | ICD-10-CM | POA: Diagnosis not present

## 2020-09-15 MED ORDER — GABAPENTIN 300 MG PO CAPS: 300.0000 mg | ORAL_CAPSULE | Freq: Three times a day (TID) | ORAL | 11 refills | Status: DC

## 2020-09-15 MED ORDER — ARMODAFINIL 250 MG PO TABS: ORAL_TABLET | ORAL | 0 refills | Status: DC

## 2020-09-15 MED ORDER — BACLOFEN 10 MG PO TABS: 10.0000 mg | ORAL_TABLET | Freq: Three times a day (TID) | ORAL | 1 refills | Status: DC

## 2020-09-15 NOTE — Progress Notes (Signed)
[facility]  PATIENT: Courtney Meza DOB: 1986/05/20  REFERRING DOCTOR OR PCP:  Concha Norway (referred by) is Dr. Melba Coon;  PCP is [place] Chard SOURCE: Patient, notes from Dr. Melba Coon, [place] and lab reports, MRI images 3 onPACS.  _________________________________   HISTORICAL  CHIEF COMPLAINT:  Chief Complaint  Patient presents with  . Follow-up    RM 13. Last seen 05/26/2020 by AL,NP. Supposed to be on dimethyl fumarate for MS. Never was able to get it filled at specialty pharmacy.     HISTORY OF PRESENT ILLNESS:  Courtney Meza is a 35 y.o. woman with RRMS diagnosed early 2018.      Update 09/15/2020: She feels her MS has been mostly stable but she has had problems getting her medication.   She was on Tecfidera but has had problems getting that or DMF with her insurance and pharmacy.   She tolerated it well for the most part.   She reports pain and muscle tightness at the top of the spine and there is some burning sensation.   The neck is stiff at times.    She denies spasms in her arms or legs though she has some twitching.    She is walking well.  Strength is fine.    Besides the upper back/neck she does not have numbness or tingling elsewhere.   Bladder is fine.    She notes vision is fine with her contacts.  No difficulty with colors.    She notes some fatigue.   She has some difficulty with sleep but notes she works nights and travels some for work.    She has had sone depression and started Wellbutrin with some benefit.  No anxiety.    She denies any difficulty with her cognition.       MS History:   In April 2006, while driving home she had the sudden onset of reduced right vision like she was looking through a heavy fog.     Vision improved over the next year but not to baseline and has been stable since..   Later in 2006, she had the onset of right facial weakness.  She had two weeks of facial twitching and then had facial weakness that seemed to develop  over minutes to one hour.  She was told she had Bell's palsy.  She is uncertain whether she had complete loss of strength in the upper face.  She had no other major symptoms over the next decade.   However, last year she had numbness in the left arm over a couple weeks The 2006 MRI showed pituitary enlargement.   Prolactin levels were recently elevated and she had a follow-up MRI showing stable pituitary enlargement and a pars intermedius cyst.   On the 09/23/2016 MRI, she had multiple white matter lesions, periventricular > juxtacortical, a left midbrain and right pons lesion.  Brain lesions were not present on the previous MRI.    She has been on Tecfidera or DMF since 2018.     IMAGING The MRI of the brain in October 30, 2004 did not show any brain lesions.   However, the second MRI performed 11/07/2004 appeared to show diffusion weighted hyperintensity in the right optic nerve as well as enhancement of the right optic nervenot present on the original 10/30/2004 MRI.      The MRI from 09/23/2016 shows multiple T2/FLAIR hyperintense foci in the hemispheres, predominantly in the periventricular white matter but also in the juxtacortical white matter. Additionally there are  left midbrain and right pontine lesions.   There is questionable optic nerve enhancement.   MRI brain 06/16/2020 showed T2/FLAIR hyperintense foci, predominantly in the periventricular and juxtacortical white matter in a pattern configuration consistent with chronic demyelinating plaque associated with multiple sclerosis.  None of the foci appear to be acute.  They do not enhance.  Compared to the MRI dated 09/23/2016, there are no new lesions..   No acute findings.  Normal enhancement pattern.  MRI cervical spine 06/16/2020 T2 hyperintense foci at the cervicomedullary junction and within the spinal cord adjacent to C2-C3 and adjacent to C4-C5.  These are consistent with chronic demyelination associated with MS.  The focus at C2-C3 was not  present on the 2019 MRI and likely represents interval development of a chronic demyelinating plaque.      Degenerative changes at C4-C5 have progressed and there is now mild to moderate spinal stenosis due to a small central disc herniation.  There is no nerve root compression  REVIEW OF SYSTEMS: Constitutional: No fevers, chills, sweats, or change in appetite.   She notes some fatigue. She sometimes has trouble with sleep. Eyes: as above Ear, nose and throat: No hearing loss, ear pain, nasal congestion, sore throat Cardiovascular: No chest pain, palpitations Respiratory: No shortness of breath at rest or with exertion.   No wheezes GastrointestinaI: No nausea, vomiting, diarrhea, abdominal pain, fecal incontinence Genitourinary: No dysuria, urinary retention or frequency.  No nocturia. Musculoskeletal: No neck pain, back pain Integumentary: No rash, pruritus, skin lesions Neurological: as above Psychiatric: No depression at this time.  No anxiety Endocrine: No palpitations, diaphoresis, change in appetite, change in weigh or increased thirst Hematologic/Lymphatic: No anemia, purpura, petechiae. Allergic/Immunologic: No itchy/runny eyes, nasal congestion, recent allergic reactions, rashes  ALLERGIES: Allergies  Allergen Reactions  . Latex Hives    HOME MEDICATIONS:  Current Outpatient Medications:  .  buPROPion (WELLBUTRIN XL) 150 MG 24 hr tablet, Take 1 tablet (150 mg total) by mouth every morning., Disp: 90 tablet, Rfl: 2 .  Cholecalciferol (VITAMIN D3) 5000 units TABS, Take 1 tablet by mouth daily., Disp: , Rfl:  .  gabapentin (NEURONTIN) 300 MG capsule, Take 1 capsule (300 mg total) by mouth 3 (three) times daily., Disp: 90 capsule, Rfl: 11 .  hydrochlorothiazide (MICROZIDE) 12.5 MG capsule, TAKE 1 CAPSULE(12.5 MG) BY MOUTH DAILY, Disp: 90 capsule, Rfl: 1 .  metoprolol tartrate (LOPRESSOR) 25 MG tablet, TAKE 1 TABLET(25 MG) BY MOUTH TWICE DAILY, Disp: 180 tablet, Rfl: 1 .   nystatin cream (MYCOSTATIN), Apply 1 application topically 2 (two) times daily., Disp: 30 g, Rfl: 0 .  pantoprazole (PROTONIX) 40 MG tablet, TAKE 1 TABLET BY MOUTH EVERY DAY, Disp: 90 tablet, Rfl: 1 .  Prenatal Multivit-Min-Fe-FA (PRENATAL VITAMINS PO), Take 1 tablet by mouth daily. , Disp: , Rfl:  .  Armodafinil 250 MG tablet, Take 1/2 to 1 pill po every morning, Disp: 90 tablet, Rfl: 0 .  baclofen (LIORESAL) 10 MG tablet, Take 1 tablet (10 mg total) by mouth 3 (three) times daily., Disp: 270 each, Rfl: 1 .  Dimethyl Fumarate 240 MG CPDR, Take 1 capsule (240 mg total) by mouth in the morning and at bedtime. (Patient not taking: Reported on 09/15/2020), Disp: 60 capsule, Rfl: 10  PAST MEDICAL HISTORY: Past Medical History:  Diagnosis Date  . Hypertension   . Multiple sclerosis ([place])   . Neuromuscular disorder ([place])   . Ovarian cyst, left 12/19/2016  . Vision abnormalities     PAST  SURGICAL HISTORY: Past Surgical History:  Procedure Laterality Date  . LAPAROSCOPIC OVARIAN CYSTECTOMY Left 12/19/2016   Procedure: LAPAROSCOPIC LEFT OVARIAN CYSTECTOMY LYSIS OF ADHESIONS;  Surgeon: Janyth Contes, MD;  Location: [facility];  Service: Gynecology;  Laterality: Left;  . WISDOM TOOTH EXTRACTION      FAMILY HISTORY: Family History  Problem Relation Age of Onset  . Diabetes Mother   . Hypertension Mother   . Healthy Father   . Diabetes Maternal Grandmother   . Hypertension Maternal Grandmother   . Liver cancer Maternal Grandfather     SOCIAL HISTORY:  Social History   Socioeconomic History  . Marital status: Married    Spouse name: Not on file  . Number of children: Not on file  . Years of education: Not on file  . Highest education level: Not on file  Occupational History  . Not on file  Tobacco Use  . Smoking status: Never Smoker  . Smokeless tobacco: Never Used  Vaping Use  . Vaping Use: Never used  Substance and Sexual Activity  . Alcohol use: No  . Drug use: No  .  Sexual activity: Not on file  Other Topics Concern  . Not on file  Social History Narrative  . Not on file   Social Determinants of Health   Financial Resource Strain: Not on file  Food Insecurity: Not on file  Transportation Needs: Not on file  Physical Activity: Not on file  Stress: Not on file  Social Connections: Not on file  Intimate Partner Violence: Not on file     PHYSICAL EXAM  Vitals:   09/15/20 1442  BP: (!) 145/102  Pulse: 87  SpO2: 97%  Weight: 289 lb (131.1 kg)  Height: 5' 6.8" (1.697 m)    Body mass index is 45.54 kg/m.   General: The patient is well-developed and well-nourished and in no acute distress  Neurologic Exam  Mental status: The patient is alert and oriented x 3 at the time of the examination. The patient has apparent normal recent and remote memory, with an apparently normal attention span and concentration ability.   Speech is normal.  Cranial nerves: Extraocular movements are full.  Color vision is now symmetric.    There is a 1+ right APD.  Colors are symmetric. Facial strength and sensation is normal.  Trapezius strength is normal.     No obvious hearing deficits are noted.  Motor:  Muscle bulk is normal.   Tone is normal. Strength is  5 / 5 in all 4 extremities.   Sensory: She has normal sensation to touch and vibration in the arms and legs.  Coordination: Cerebellar testing shows good finger-nose-finger bilaterally.  Gait and station: Station is normal.   Her gait is normal. Tandem gait is slightly wide . Romberg is negative.  Reflexes: Deep tendon reflexes are increased on the left.. She has spread at the left knee.        ASSESSMENT AND PLAN  Multiple sclerosis (HCC)  Muscle spasms of both lower extremities - Plan: baclofen (LIORESAL) 10 MG tablet  Chronic fatigue - Plan: baclofen (LIORESAL) 10 MG tablet  Shift work sleep disorder  Muscle tightness   1.    We will try to help her get back on DMF.  I will check a CBC  with differential and LFT today.   If copay unaffordable will need to consider other options  2.    Nuvigil for the shift work disorder 3.    Continue  vitamin D supplementation.  4.    gabapentin for dysesthesias and continue baclofen.   5.   She will return to see me in 5-6 months or sooner if there are new or worsening neurologic symptoms.     Daphene Chisholm A. Felecia Shelling, MD, PhD 0/96/2836, 6:29 PM Certified in Neurology, Clinical Neurophysiology, Sleep Medicine, Pain Medicine and Neuroimaging  [facility] [address], [place], [place] [address] [phone]

## 2020-09-15 NOTE — Telephone Encounter (Signed)
Faxed completed/signed PA dimethyl fumarate 240mg  to [facility] at [phone]. Received fax confirmation. Waiting on determination.

## 2020-09-16 ENCOUNTER — Telehealth: Payer: Self-pay | Admitting: *Deleted

## 2020-09-16 LAB — HEPATIC FUNCTION PANEL
ALT: 9 IU/L (ref 0–32)
AST: 9 IU/L (ref 0–40)
Albumin: 4 g/dL (ref 3.8–4.8)
Alkaline Phosphatase: 107 IU/L (ref 44–121)
Bilirubin Total: <0.2 mg/dL (ref 0.0–1.2)
Bilirubin, Direct: <0.1 mg/dL (ref 0.00–0.40)
Total Protein: 7.3 g/dL (ref 6.0–8.5)

## 2020-09-16 LAB — CBC WITH DIFFERENTIAL/PLATELET
Basophils Absolute: 0 10*3/uL (ref 0.0–0.2)
Basos: 1 %
EOS (ABSOLUTE): 0.1 10*3/uL (ref 0.0–0.4)
Eos: 3 %
Hematocrit: 35.9 % (ref 34.0–46.6)
Hemoglobin: 11.5 g/dL (ref 11.1–15.9)
Immature Grans (Abs): 0 10*3/uL (ref 0.0–0.1)
Immature Granulocytes: 0 %
Lymphocytes Absolute: 1.4 10*3/uL (ref 0.7–3.1)
Lymphs: 32 %
MCH: 25.8 pg — ABNORMAL LOW (ref 26.6–33.0)
MCHC: 32 g/dL (ref 31.5–35.7)
MCV: 81 fL (ref 79–97)
Monocytes Absolute: 0.3 10*3/uL (ref 0.1–0.9)
Monocytes: 8 %
Neutrophils Absolute: 2.5 10*3/uL (ref 1.4–7.0)
Neutrophils: 56 %
Platelets: 350 10*3/uL (ref 150–450)
RBC: 4.45 x10E6/uL (ref 3.77–5.28)
RDW: 14.4 % (ref 11.7–15.4)
WBC: 4.4 10*3/uL (ref 3.4–10.8)

## 2020-09-16 NOTE — Telephone Encounter (Signed)
Submitted PA armodafinil on CMM. Key: [id number]. PA Case ID: [id number] - Rx #: [id number]. Waiting on determination from [facility]

## 2020-09-21 ENCOUNTER — Other Ambulatory Visit: Payer: Self-pay | Admitting: *Deleted

## 2020-09-21 DIAGNOSIS — G35 Multiple sclerosis: Secondary | ICD-10-CM

## 2020-09-21 MED ORDER — DIMETHYL FUMARATE 120 MG PO CPDR: DELAYED_RELEASE_CAPSULE | ORAL | 0 refills | Status: DC

## 2020-09-21 MED ORDER — DIMETHYL FUMARATE 240 MG PO CPDR: 240.0000 mg | DELAYED_RELEASE_CAPSULE | Freq: Two times a day (BID) | ORAL | 11 refills | Status: DC

## 2020-09-21 NOTE — Telephone Encounter (Signed)
Spoke with pt about armodafinil (see separate phone note). She also reported dimethyl fumarate via insurance will cost roughly 719.00/month. She asked about filling rx at [facility] using goodrx as it will be about 135.15/per month. She is going to contact them first to see if they can fill this for her. She will call or send mychart message back letting to let us know.

## 2020-09-21 NOTE — Telephone Encounter (Signed)
Called pt to see what he plan was. She has chosen to not send work schedule She is going to use goodrx coupon to fill prescription.

## 2020-10-05 NOTE — Telephone Encounter (Signed)
Received fax from [facility] that PA approved and copay for dimethyl fumarate $86.15.

## 2020-10-22 ENCOUNTER — Encounter: Payer: Self-pay | Admitting: Internal Medicine

## 2020-10-22 ENCOUNTER — Ambulatory Visit (INDEPENDENT_AMBULATORY_CARE_PROVIDER_SITE_OTHER): Payer: 59 | Admitting: Internal Medicine

## 2020-10-22 ENCOUNTER — Other Ambulatory Visit: Payer: Self-pay

## 2020-10-22 VITALS — BP 132/80 | HR 88 | Temp 98.1°F | Ht 66.0 in | Wt 278.2 lb

## 2020-10-22 DIAGNOSIS — Z6841 Body Mass Index (BMI) 40.0 and over, adult: Secondary | ICD-10-CM | POA: Diagnosis not present

## 2020-10-22 DIAGNOSIS — I10 Essential (primary) hypertension: Secondary | ICD-10-CM | POA: Diagnosis not present

## 2020-10-22 MED ORDER — SAXENDA 18 MG/3ML ~~LOC~~ SOPN: 3.0000 mg | PEN_INJECTOR | Freq: Every day | SUBCUTANEOUS | 3 refills | Status: DC

## 2020-10-22 NOTE — Patient Instructions (Signed)

## 2020-10-22 NOTE — Progress Notes (Signed)
Earleen Newport as a Education administrator for Maximino Greenland, MD.,have documented all relevant documentation on the behalf of Maximino Greenland, MD,as directed by  Maximino Greenland, MD while in the presence of Maximino Greenland, MD. This visit occurred during the SARS-CoV-2 public health emergency.  Safety protocols were in place, including screening questions prior to the visit, additional usage of staff PPE, and extensive cleaning of exam room while observing appropriate contact time as indicated for disinfecting solutions.  Subjective:     Patient ID: Courtney Meza , female    DOB: 01-07-1986 , 35 y.o.   MRN: [id number]   Chief Complaint  Patient presents with  . Hypertension    HPI  Patient here for a blood pressure f/u. She reports compliance with meds. She denies headaches, chest pain and shortness of breath. She is still working as travel Marine scientist, currently in [place], [place].         Hypertension This is a chronic problem. The current episode started more than 1 year ago. The problem has been waxing and waning since onset. The problem is controlled. Pertinent negatives include no anxiety. There are no associated agents to hypertension. Risk factors for coronary artery disease include obesity and sedentary lifestyle. Past treatments include diuretics and beta blockers. There are no compliance problems.  There is no history of angina. There is no history of chronic renal disease.     Past Medical History:  Diagnosis Date  . Hypertension   . Multiple sclerosis ([place])   . Neuromuscular disorder ([place])   . Ovarian cyst, left 12/19/2016  . Vision abnormalities      Family History  Problem Relation Age of Onset  . Diabetes Mother   . Hypertension Mother   . Healthy Father   . Diabetes Maternal Grandmother   . Hypertension Maternal Grandmother   . Liver cancer Maternal Grandfather      Current Outpatient Medications:  .  Armodafinil 250 MG tablet, Take 1/2 to 1 pill po every  morning, Disp: 90 tablet, Rfl: 0 .  baclofen (LIORESAL) 10 MG tablet, Take 1 tablet (10 mg total) by mouth 3 (three) times daily., Disp: 270 each, Rfl: 1 .  buPROPion (WELLBUTRIN XL) 150 MG 24 hr tablet, Take 1 tablet (150 mg total) by mouth every morning., Disp: 90 tablet, Rfl: 2 .  Cholecalciferol (VITAMIN D3) 5000 units TABS, Take 1 tablet by mouth daily., Disp: , Rfl:  .  Dimethyl Fumarate 120 MG CPDR, Take 1 capsule by mouth twice daily for 7 days. Then start taking 240mg  twice daily thereafter., Disp: 14 capsule, Rfl: 0 .  Dimethyl Fumarate 240 MG CPDR, Take 1 capsule (240 mg total) by mouth in the morning and at bedtime., Disp: 60 capsule, Rfl: 11 .  gabapentin (NEURONTIN) 300 MG capsule, Take 1 capsule (300 mg total) by mouth 3 (three) times daily., Disp: 90 capsule, Rfl: 11 .  hydrochlorothiazide (MICROZIDE) 12.5 MG capsule, TAKE 1 CAPSULE(12.5 MG) BY MOUTH DAILY, Disp: 90 capsule, Rfl: 1 .  Liraglutide -Weight Management (SAXENDA) 18 MG/3ML SOPN, Inject 3 mg into the skin daily., Disp: 3 mL, Rfl: 3 .  metoprolol tartrate (LOPRESSOR) 25 MG tablet, TAKE 1 TABLET(25 MG) BY MOUTH TWICE DAILY, Disp: 180 tablet, Rfl: 1 .  nystatin cream (MYCOSTATIN), Apply 1 application topically 2 (two) times daily., Disp: 30 g, Rfl: 0 .  pantoprazole (PROTONIX) 40 MG tablet, TAKE 1 TABLET BY MOUTH EVERY DAY, Disp: 90 tablet, Rfl: 1 .  Prenatal Multivit-Min-Fe-FA (PRENATAL  VITAMINS PO), Take 1 tablet by mouth daily. , Disp: , Rfl:    Allergies  Allergen Reactions  . Latex Hives     Review of Systems  Constitutional: Negative.   Respiratory: Negative.   Cardiovascular: Negative.   Neurological: Negative.   Psychiatric/Behavioral: Negative.      Today's Vitals   10/22/20 1515  BP: 132/80  Pulse: 88  Temp: 98.1 F (36.7 C)  TempSrc: Oral  Weight: 278 lb 3.2 oz (126.2 kg)  Height: 5\' 6"  (1.676 m)  PainSc: 0-No pain   Body mass index is 44.9 kg/m.  BP Readings from Last 3 Encounters:   10/22/20 132/80  09/15/20 (!) 145/102  07/23/20 138/70    Objective:  Physical Exam Vitals and nursing note reviewed.  Constitutional:      Appearance: Normal appearance. She is obese.  HENT:     Head: Normocephalic and atraumatic.     Nose:     Comments: Masked     Mouth/Throat:     Comments: Masked  Cardiovascular:     Rate and Rhythm: Normal rate and regular rhythm.     Heart sounds: Normal heart sounds.  Pulmonary:     Effort: Pulmonary effort is normal.     Breath sounds: Normal breath sounds.  Musculoskeletal:     Cervical back: Normal range of motion.  Skin:    General: Skin is warm.  Neurological:     General: No focal deficit present.     Mental Status: She is alert.  Psychiatric:        Mood and Affect: Mood normal.        Behavior: Behavior normal.         Assessment And Plan:     1. Essential hypertension, benign Comments: Chronic, fair control. She is encouraged to limit her salt intake. She will f/u in May for her next physical exam.   2. Class 3 severe obesity due to excess calories with serious comorbidity and body mass index (BMI) of 40.0 to 44.9 in adult [facility]) Comments: BMI 44.  WE DISCUSSED THE USE OF WEIGHT LOSS MEDS TO HELP WITH OBESITY. SHE DENIES FAMILY HISTORY OF THYROID CANCER. SHE WAS INSTRUCTED ON HOW TO SELF ADMINISTER THE MEDICATION. SHE WILL START WITH 0.6MG  DAILY AND INCREASE HER DOSE ONCE WEEKLY. SHE WAS REMINDED OF POSSIBLE SIDE EFFECTS INCLUDING NAUSEA, HEADACHES AND DIZZINESS.  SHE WAS ADVISED TO STOP EATING WHEN SHE BEGINS TO FEEL FULL.  SHE WILL RTO IN 8 WEEKS FOR RE-EVALUATION.  Patient was given opportunity to ask questions. Patient verbalized understanding of the plan and was able to repeat key elements of the plan. All questions were answered to their satisfaction.   I, Maximino Greenland, MD, have reviewed all documentation for this visit. The documentation on 10/22/20 for the exam, diagnosis, procedures, and orders are all  accurate and complete.   IF YOU HAVE BEEN REFERRED TO A SPECIALIST, IT MAY TAKE 1-2 WEEKS TO SCHEDULE/PROCESS THE REFERRAL. IF YOU HAVE NOT HEARD FROM US/SPECIALIST IN TWO WEEKS, PLEASE GIVE [place] A CALL AT [phone] X [phone].   THE PATIENT IS ENCOURAGED TO PRACTICE SOCIAL DISTANCING DUE TO THE COVID-19 PANDEMIC.

## 2020-12-09 ENCOUNTER — Other Ambulatory Visit: Payer: Self-pay

## 2020-12-09 ENCOUNTER — Encounter: Payer: Self-pay | Admitting: Internal Medicine

## 2020-12-09 ENCOUNTER — Ambulatory Visit (INDEPENDENT_AMBULATORY_CARE_PROVIDER_SITE_OTHER): Payer: 59 | Admitting: Internal Medicine

## 2020-12-09 VITALS — BP 134/78 | HR 78 | Temp 98.6°F | Ht 66.0 in | Wt 286.8 lb

## 2020-12-09 DIAGNOSIS — I10 Essential (primary) hypertension: Secondary | ICD-10-CM

## 2020-12-09 DIAGNOSIS — F419 Anxiety disorder, unspecified: Secondary | ICD-10-CM

## 2020-12-09 DIAGNOSIS — Z0000 Encounter for general adult medical examination without abnormal findings: Secondary | ICD-10-CM | POA: Diagnosis not present

## 2020-12-09 DIAGNOSIS — Z6841 Body Mass Index (BMI) 40.0 and over, adult: Secondary | ICD-10-CM

## 2020-12-09 DIAGNOSIS — R002 Palpitations: Secondary | ICD-10-CM

## 2020-12-09 DIAGNOSIS — R600 Localized edema: Secondary | ICD-10-CM

## 2020-12-09 LAB — POCT URINALYSIS DIPSTICK
Bilirubin, UA: NEGATIVE
Glucose, UA: NEGATIVE
Ketones, UA: NEGATIVE
Leukocytes, UA: NEGATIVE
Nitrite, UA: NEGATIVE
Protein, UA: NEGATIVE
Spec Grav, UA: 1.025 (ref 1.010–1.025)
Urobilinogen, UA: 0.2 E.U./dL
pH, UA: 7 (ref 5.0–8.0)

## 2020-12-09 LAB — POCT UA - MICROALBUMIN
Albumin/Creatinine Ratio, Urine, POC: 30
Creatinine, POC: 300 mg/dL
Microalbumin Ur, POC: 30 mg/L

## 2020-12-09 NOTE — Patient Instructions (Signed)
Health Maintenance, Female Adopting a healthy lifestyle and getting preventive care are important in promoting health and wellness. Ask your health care provider about:  The right schedule for you to have regular tests and exams.  Things you can do on your own to prevent diseases and keep yourself healthy. What should I know about diet, weight, and exercise? Eat a healthy diet  Eat a diet that includes plenty of vegetables, fruits, low-fat dairy products, and lean protein.  Do not eat a lot of foods that are high in solid fats, added sugars, or sodium.   Maintain a healthy weight Body mass index (BMI) is used to identify weight problems. It estimates body fat based on height and weight. Your health care provider can help determine your BMI and help you achieve or maintain a healthy weight. Get regular exercise Get regular exercise. This is one of the most important things you can do for your health. Most adults should:  Exercise for at least 150 minutes each week. The exercise should increase your heart rate and make you sweat (moderate-intensity exercise).  Do strengthening exercises at least twice a week. This is in addition to the moderate-intensity exercise.  Spend less time sitting. Even light physical activity can be beneficial. Watch cholesterol and blood lipids Have your blood tested for lipids and cholesterol at 37 years of age, then have this test every 5 years. Have your cholesterol levels checked more often if:  Your lipid or cholesterol levels are high.  You are older than 37 years of age.  You are at high risk for heart disease. What should I know about cancer screening? Depending on your health history and family history, you may need to have cancer screening at various ages. This may include screening for:  Breast cancer.  Cervical cancer.  Colorectal cancer.  Skin cancer.  Lung cancer. What should I know about heart disease, diabetes, and high blood  pressure? Blood pressure and heart disease  High blood pressure causes heart disease and increases the risk of stroke. This is more likely to develop in people who have high blood pressure readings, are of African descent, or are overweight.  Have your blood pressure checked: ? Every 3-5 years if you are 18-39 years of age. ? Every year if you are 40 years old or older. Diabetes Have regular diabetes screenings. This checks your fasting blood sugar level. Have the screening done:  Once every three years after age 40 if you are at a normal weight and have a low risk for diabetes.  More often and at a younger age if you are overweight or have a high risk for diabetes. What should I know about preventing infection? Hepatitis B If you have a higher risk for hepatitis B, you should be screened for this virus. Talk with your health care provider to find out if you are at risk for hepatitis B infection. Hepatitis C Testing is recommended for:  Everyone born from 1945 through 1965.  Anyone with known risk factors for hepatitis C. Sexually transmitted infections (STIs)  Get screened for STIs, including gonorrhea and chlamydia, if: ? You are sexually active and are younger than 37 years of age. ? You are older than 37 years of age and your health care provider tells you that you are at risk for this type of infection. ? Your sexual activity has changed since you were last screened, and you are at increased risk for chlamydia or gonorrhea. Ask your health care provider   if you are at risk.  Ask your health care provider about whether you are at high risk for HIV. Your health care provider may recommend a prescription medicine to help prevent HIV infection. If you choose to take medicine to prevent HIV, you should first get tested for HIV. You should then be tested every 3 months for as long as you are taking the medicine. Pregnancy  If you are about to stop having your period (premenopausal) and  you may become pregnant, seek counseling before you get pregnant.  Take 400 to 800 micrograms (mcg) of folic acid every day if you become pregnant.  Ask for birth control (contraception) if you want to prevent pregnancy. Osteoporosis and menopause Osteoporosis is a disease in which the bones lose minerals and strength with aging. This can result in bone fractures. If you are 65 years old or older, or if you are at risk for osteoporosis and fractures, ask your health care provider if you should:  Be screened for bone loss.  Take a calcium or vitamin D supplement to lower your risk of fractures.  Be given hormone replacement therapy (HRT) to treat symptoms of menopause. Follow these instructions at home: Lifestyle  Do not use any products that contain nicotine or tobacco, such as cigarettes, e-cigarettes, and chewing tobacco. If you need help quitting, ask your health care provider.  Do not use street drugs.  Do not share needles.  Ask your health care provider for help if you need support or information about quitting drugs. Alcohol use  Do not drink alcohol if: ? Your health care provider tells you not to drink. ? You are pregnant, may be pregnant, or are planning to become pregnant.  If you drink alcohol: ? Limit how much you use to 0-1 drink a day. ? Limit intake if you are breastfeeding.  Be aware of how much alcohol is in your drink. In the [place] one drink equals one 12 oz bottle of beer (355 mL), one 5 oz glass of wine (148 mL), or one 1 oz glass of hard liquor (44 mL). General instructions  Schedule regular health, dental, and eye exams.  Stay current with your vaccines.  Tell your health care provider if: ? You often feel depressed. ? You have ever been abused or do not feel safe at home. Summary  Adopting a healthy lifestyle and getting preventive care are important in promoting health and wellness.  Follow your health care provider's instructions about healthy  diet, exercising, and getting tested or screened for diseases.  Follow your health care provider's instructions on monitoring your cholesterol and blood pressure. This information is not intended to replace advice given to you by your health care provider. Make sure you discuss any questions you have with your health care provider. Document Revised: 07/11/2018 Document Reviewed: 07/11/2018 Elsevier Patient Education  2021 [employer]  

## 2020-12-09 NOTE — Progress Notes (Signed)
This visit occurred during the SARS-CoV-2 public health emergency.  Safety protocols were in place, including screening questions prior to the visit, additional usage of staff PPE, and extensive cleaning of exam room while observing appropriate contact time as indicated for disinfecting solutions.  Subjective:     Patient ID: Courtney Meza , female    DOB: 03-11-1986 , 35 y.o.   MRN: [id number]   Chief Complaint  Patient presents with  . Annual Exam  . Hypertension    HPI  She is here today for a full physical exam. She is followed by GYN for her pelvic exams. She denies having any headaches and visual disturbances.  Pt has been noticing swelling from feet down to her legs, noticed about 3 weeks ago, no pain but she does feel a tight discomfort.   Hypertension This is a chronic problem. The current episode started more than 1 year ago. The problem has been gradually improving since onset. The problem is uncontrolled. Associated symptoms include palpitations. Pertinent negatives include no chest pain. Risk factors for coronary artery disease include obesity and sedentary lifestyle. Past treatments include beta blockers. The current treatment provides moderate improvement. Compliance problems include exercise.      Past Medical History:  Diagnosis Date  . Hypertension   . Multiple sclerosis ([place])   . Neuromuscular disorder ([place])   . Ovarian cyst, left 12/19/2016  . Vision abnormalities      Family History  Problem Relation Age of Onset  . Diabetes Mother   . Hypertension Mother   . Healthy Father   . Diabetes Maternal Grandmother   . Hypertension Maternal Grandmother   . Liver cancer Maternal Grandfather      Current Outpatient Medications:  .  Armodafinil 250 MG tablet, Take 1/2 to 1 pill po every morning, Disp: 90 tablet, Rfl: 0 .  baclofen (LIORESAL) 10 MG tablet, Take 1 tablet (10 mg total) by mouth 3 (three) times daily., Disp: 270 each, Rfl: 1 .  Cholecalciferol  (VITAMIN D3) 5000 units TABS, Take 1 tablet by mouth daily., Disp: , Rfl:  .  Dimethyl Fumarate 240 MG CPDR, Take 1 capsule (240 mg total) by mouth in the morning and at bedtime., Disp: 60 capsule, Rfl: 11 .  gabapentin (NEURONTIN) 300 MG capsule, Take 1 capsule (300 mg total) by mouth 3 (three) times daily., Disp: 90 capsule, Rfl: 11 .  hydrochlorothiazide (MICROZIDE) 12.5 MG capsule, TAKE 1 CAPSULE(12.5 MG) BY MOUTH DAILY, Disp: 90 capsule, Rfl: 1 .  Liraglutide -Weight Management (SAXENDA) 18 MG/3ML SOPN, Inject 3 mg into the skin daily., Disp: 3 mL, Rfl: 3 .  metoprolol tartrate (LOPRESSOR) 25 MG tablet, TAKE 1 TABLET(25 MG) BY MOUTH TWICE DAILY, Disp: 180 tablet, Rfl: 1 .  nystatin cream (MYCOSTATIN), Apply 1 application topically 2 (two) times daily., Disp: 30 g, Rfl: 0 .  pantoprazole (PROTONIX) 40 MG tablet, TAKE 1 TABLET BY MOUTH EVERY DAY, Disp: 90 tablet, Rfl: 1 .  Prenatal Multivit-Min-Fe-FA (PRENATAL VITAMINS PO), Take 1 tablet by mouth daily. , Disp: , Rfl:  .  buPROPion (WELLBUTRIN XL) 150 MG 24 hr tablet, Take 1 tablet (150 mg total) by mouth every morning., Disp: 90 tablet, Rfl: 2 .  Dimethyl Fumarate 120 MG CPDR, Take 1 capsule by mouth twice daily for 7 days. Then start taking 278m twice daily thereafter. (Patient not taking: Reported on 12/09/2020), Disp: 14 capsule, Rfl: 0   Allergies  Allergen Reactions  . Latex Hives      The  patient states she uses none for birth control. Last LMP was Patient's last menstrual period was 11/11/2020 (exact date).. Negative for Dysmenorrhea. Negative for: breast discharge, breast lump(s), breast pain and breast self exam. Associated symptoms include abnormal vaginal bleeding. Pertinent negatives include abnormal bleeding (hematology), anxiety, decreased libido, depression, difficulty falling sleep, dyspareunia, history of infertility, nocturia, sexual dysfunction, sleep disturbances, urinary incontinence, urinary urgency, vaginal discharge and  vaginal itching. Diet regular.The patient states her exercise level is    . The patient's tobacco use is:  Social History   Tobacco Use  Smoking Status Never Smoker  Smokeless Tobacco Never Used  . She has been exposed to passive smoke. The patient's alcohol use is:  Social History   Substance and Sexual Activity  Alcohol Use No    Review of Systems  Constitutional: Negative.   HENT: Negative.   Eyes: Negative.   Respiratory: Negative.   Cardiovascular: Positive for palpitations and leg swelling. Negative for chest pain.       She c/o palpitations. Once or twice monthly. Possibly precipitated by anxiety. Unable to identify any other triggers. Denies chest pain. Does get sob when it occurs. Pt has been noticing swelling from feet down to her legs, noticed about 3 weeks ago, no pain but she does feel a tight discomfort. Also admits to occasional orthopnea.   Gastrointestinal: Negative.   Endocrine: Negative.   Genitourinary: Negative.   Musculoskeletal: Negative.   Skin: Negative.   Allergic/Immunologic: Negative.   Neurological: Negative.   Hematological: Negative.   Psychiatric/Behavioral: Negative.      Today's Vitals   12/09/20 1416  BP: 134/78  Pulse: 78  Temp: 98.6 F (37 C)  Weight: 286 lb 12.8 oz (130.1 kg)  Height: '5\' 6"'  (1.676 m)   Body mass index is 46.29 kg/m.   Wt Readings from Last 3 Encounters:  12/09/20 286 lb 12.8 oz (130.1 kg)  10/22/20 278 lb 3.2 oz (126.2 kg)  09/15/20 289 lb (131.1 kg)     Objective:  Physical Exam Vitals and nursing note reviewed.  Constitutional:      Appearance: Normal appearance. She is obese.  HENT:     Head: Normocephalic and atraumatic.     Right Ear: Tympanic membrane, ear canal and external ear normal.     Left Ear: Tympanic membrane, ear canal and external ear normal.     Nose:     Comments: Masked     Mouth/Throat:     Comments: Masked  Eyes:     Extraocular Movements: Extraocular movements intact.      Conjunctiva/sclera: Conjunctivae normal.     Pupils: Pupils are equal, round, and reactive to light.  Cardiovascular:     Rate and Rhythm: Normal rate and regular rhythm.     Pulses: Normal pulses.     Heart sounds: Normal heart sounds.  Pulmonary:     Effort: Pulmonary effort is normal.     Breath sounds: Normal breath sounds.  Chest:  Breasts:     Tanner Score is 5.     Right: Normal.     Left: Normal.    Abdominal:     General: Bowel sounds are normal.     Palpations: Abdomen is soft.     Comments: Obese, soft  Genitourinary:    Comments: deferred Musculoskeletal:        General: Normal range of motion.     Cervical back: Normal range of motion and neck supple.     Right lower leg:  Edema present.     Left lower leg: Edema present.     Comments: Pitting edema  Skin:    General: Skin is warm and dry.  Neurological:     General: No focal deficit present.     Mental Status: She is alert and oriented to person, place, and time.  Psychiatric:        Mood and Affect: Mood normal.        Behavior: Behavior normal.         Assessment And Plan:     1. Routine general medical examination at health care facility Comments: A full exam was performed. Importance of monthly self breast exams was discussed with the patient. PATIENT IS ADVISED TO GET 30-45 MINUTES REGULAR EXERCISE NO LESS THAN FOUR TO FIVE DAYS PER WEEK - BOTH WEIGHTBEARING EXERCISES AND AEROBIC ARE RECOMMENDED.  PATIENT IS ADVISED TO FOLLOW A HEALTHY DIET WITH AT LEAST SIX FRUITS/VEGGIES PER DAY, DECREASE INTAKE OF RED MEAT, AND TO INCREASE FISH INTAKE TO TWO DAYS PER WEEK.  MEATS/FISH SHOULD NOT BE FRIED, BAKED OR BROILED IS PREFERABLE.  IT IS ALSO IMPORTANT TO CUT BACK ON YOUR SUGAR INTAKE. PLEASE AVOID ANYTHING WITH ADDED SUGAR, CORN SYRUP OR OTHER SWEETENERS. IF YOU MUST USE A SWEETENER, YOU CAN TRY STEVIA. IT IS ALSO IMPORTANT TO AVOID ARTIFICIALLY SWEETENERS AND DIET BEVERAGES. LASTLY, I SUGGEST WEARING SPF 50  SUNSCREEN ON EXPOSED PARTS AND ESPECIALLY WHEN IN THE DIRECT SUNLIGHT FOR AN EXTENDED PERIOD OF TIME.  PLEASE AVOID FAST FOOD RESTAURANTS AND INCREASE YOUR WATER INTAKE.  - Lipid panel - EKG 12-Lead - CMP14+EGFR  2. Essential hypertension, benign Comments: Chronic, fair control.  She is advised to follow low sodium diet.  Pt is aware goal BP is less than 130/80. EKG performed, NSR w/o acute changes. She will f/u in 6 months for re-evaluation.  - POCT UA - Microalbumin - POCT urinalysis dipstick  3. Anxiety Comments: She would like to resume Wellbutrin XL 145m daily. Pt advised it can interact with metoprolol. Again, encouraged to start magnesium daily.   4. Palpitations Comments: She is encouraged to stay well hydrated. She may also benefit from magnesium supplementation. She agrees w/ Cardiology evaluation.  - Ambulatory referral to Cardiology - TSH - Magnesium - ECHOCARDIOGRAM COMPLETE; Future  5. Lower extremity edema Comments: She is advised to elevate legs, wear compression hose and decrease salt intake. I will check 2d echo to evaluate heart fxn. I will also check BNP.  - Brain natriuretic peptide ((57903 - ECHOCARDIOGRAM COMPLETE; Future  6. Class 3 severe obesity due to excess calories with serious comorbidity and body mass index (BMI) of 45.0 to 49.9 in adult (HCC) BMI 46. She is encouraged to initially strive for BMI less than 40 to decrease cardiac risk. She is encouraged to gradually increase her daily activity level.   Patient was given opportunity to ask questions. Patient verbalized understanding of the plan and was able to repeat key elements of the plan. All questions were answered to their satisfaction.   I, RMaximino Greenland MD, have reviewed all documentation for this visit. The documentation on 12/09/20 for the exam, diagnosis, procedures, and orders are all accurate and complete.  THE PATIENT IS ENCOURAGED TO PRACTICE SOCIAL DISTANCING DUE TO THE COVID-19  PANDEMIC.

## 2020-12-10 LAB — LIPID PANEL
Chol/HDL Ratio: 3.8 ratio (ref 0.0–4.4)
Cholesterol, Total: 205 mg/dL — ABNORMAL HIGH (ref 100–199)
HDL: 54 mg/dL (ref >39)
LDL Chol Calc (NIH): 135 mg/dL — ABNORMAL HIGH (ref 0–99)
Triglycerides: 87 mg/dL (ref 0–149)
VLDL Cholesterol Cal: 16 mg/dL (ref 5–40)

## 2020-12-10 LAB — TSH: TSH: 2.08 u[IU]/mL (ref 0.450–4.500)

## 2020-12-10 LAB — CMP14+EGFR
ALT: 16 IU/L (ref 0–32)
AST: 18 IU/L (ref 0–40)
Albumin/Globulin Ratio: 1.3 (ref 1.2–2.2)
Albumin: 4.1 g/dL (ref 3.8–4.8)
Alkaline Phosphatase: 82 IU/L (ref 44–121)
BUN/Creatinine Ratio: 13 (ref 9–23)
BUN: 10 mg/dL (ref 6–20)
Bilirubin Total: 0.3 mg/dL (ref 0.0–1.2)
CO2: 25 mmol/L (ref 20–29)
Calcium: 9.3 mg/dL (ref 8.7–10.2)
Chloride: 99 mmol/L (ref 96–106)
Creatinine, Ser: 0.75 mg/dL (ref 0.57–1.00)
Globulin, Total: 3.1 g/dL (ref 1.5–4.5)
Glucose: 84 mg/dL (ref 65–99)
Potassium: 4.5 mmol/L (ref 3.5–5.2)
Sodium: 140 mmol/L (ref 134–144)
Total Protein: 7.2 g/dL (ref 6.0–8.5)
eGFR: 106 mL/min/{1.73_m2} (ref >59)

## 2020-12-10 LAB — BRAIN NATRIURETIC PEPTIDE: BNP: 52 pg/mL (ref 0.0–100.0)

## 2020-12-10 LAB — MAGNESIUM: Magnesium: 1.6 mg/dL (ref 1.6–2.3)

## 2020-12-11 ENCOUNTER — Telehealth: Payer: Self-pay

## 2020-12-11 NOTE — Telephone Encounter (Signed)
-----   Message from Glendale Chard, MD sent at 12/10/2020  9:59 PM EDT ----- Please cal pt - advised to start magnesium glycinate - 250mg  nightly. Advise her she can get otc. Also go over lab results.   thx

## 2020-12-29 ENCOUNTER — Telehealth (HOSPITAL_COMMUNITY): Payer: Self-pay | Admitting: Internal Medicine

## 2020-12-29 NOTE — Telephone Encounter (Signed)
Patient called and Left [facility]icemail at [facility] per [facility] to cancel echocardiogram appt for 12/30/20. Order will be removed fromt he ECHO WQ and if patient calls back to reschedule we will reinstate the order. Thank you

## 2020-12-30 ENCOUNTER — Ambulatory Visit (HOSPITAL_COMMUNITY): Payer: 59

## 2021-02-13 IMAGING — RF DG HYSTEROGRAM
1 series · 6 of 6 positions shown · IV contrast (omnipaque)
Comparison: None.

CLINICAL DATA: Infertility.

EXAM:
HYSTEROSALPINGOGRAM
TECHNIQUE: Following cleansing of the cervix and vagina with Betadine solution,
a hysterosalpingogram was performed using a 5-French
hysterosalpingogram catheter and Omnipaque 300 contrast. The patient
tolerated the examination without difficulty.

[Series 1: one shot · 6 of 6 slices shown]
[im 1/6]
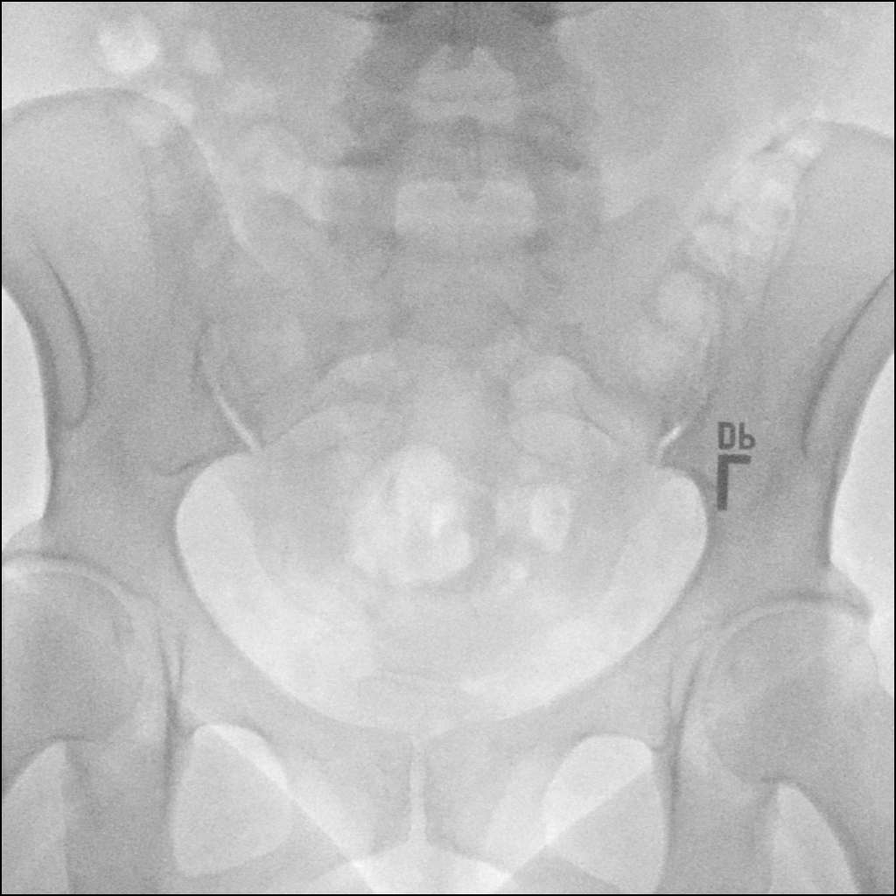
[im 2/6]
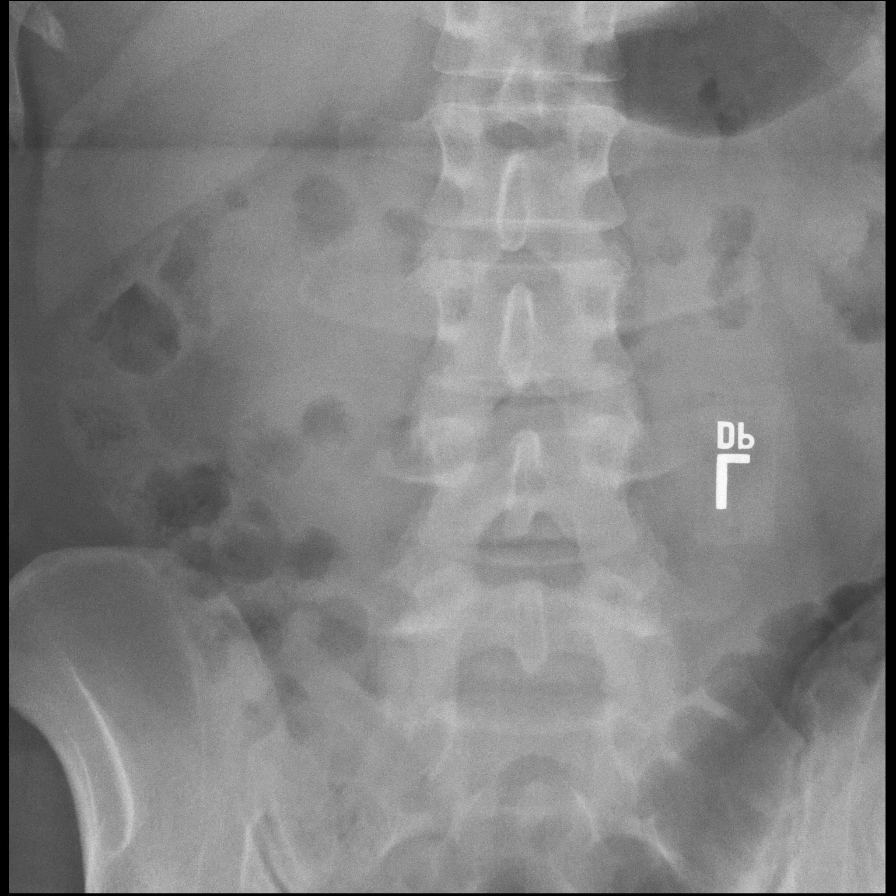
[im 3/6]
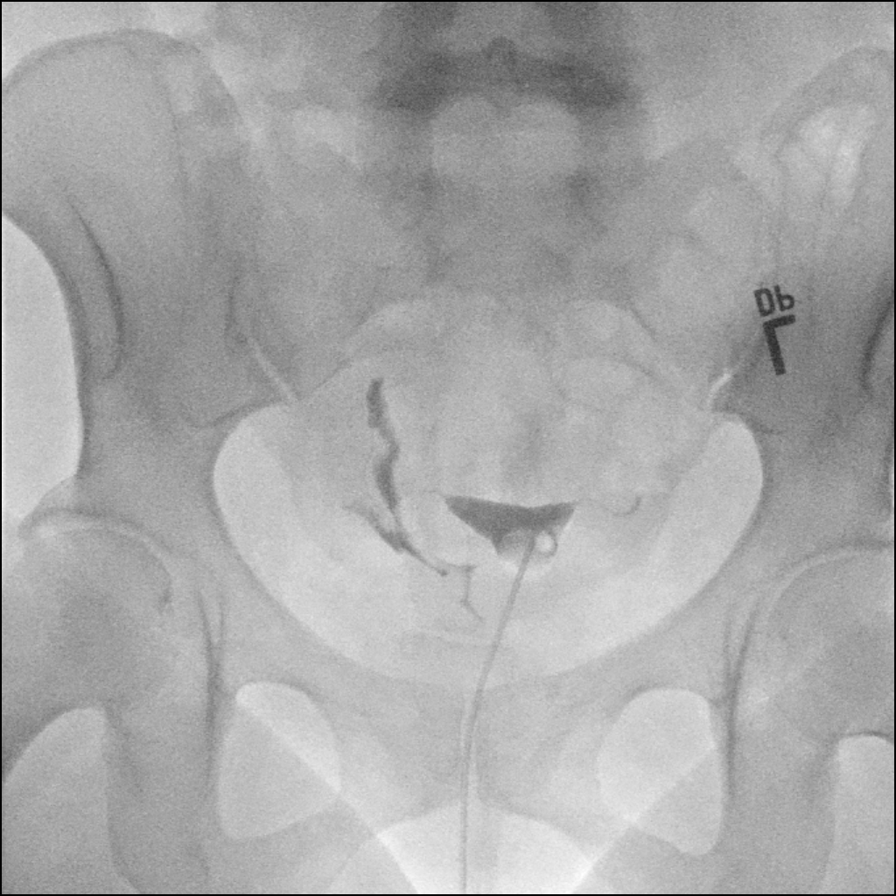
[im 4/6]
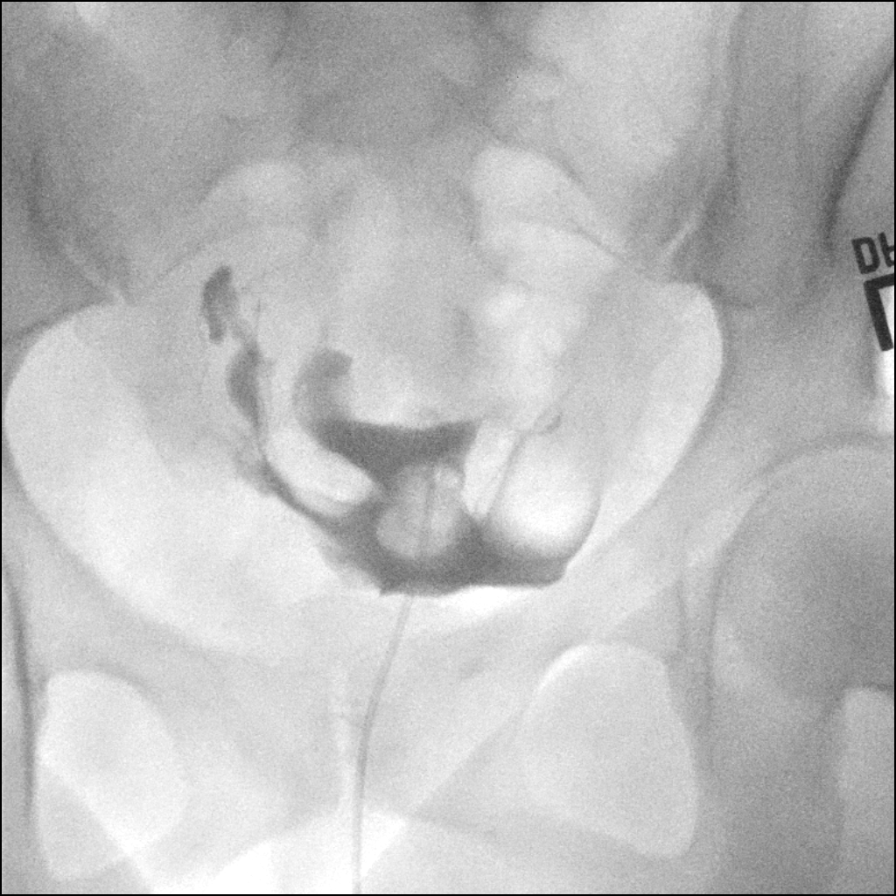
[im 5/6]
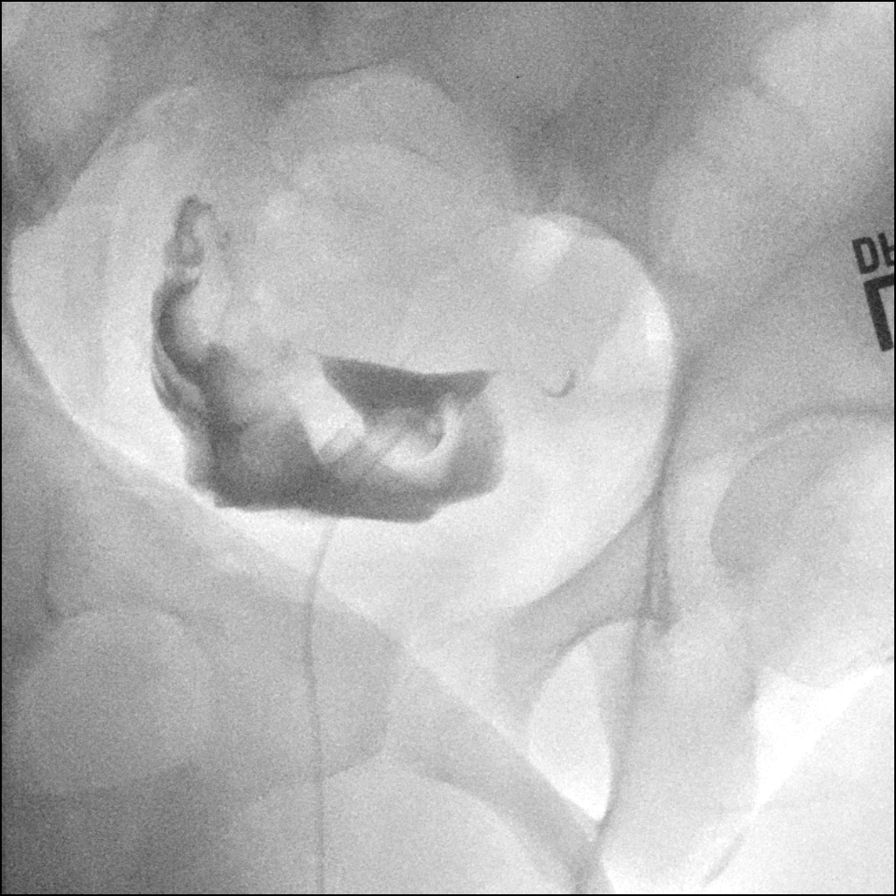
[im 6/6]
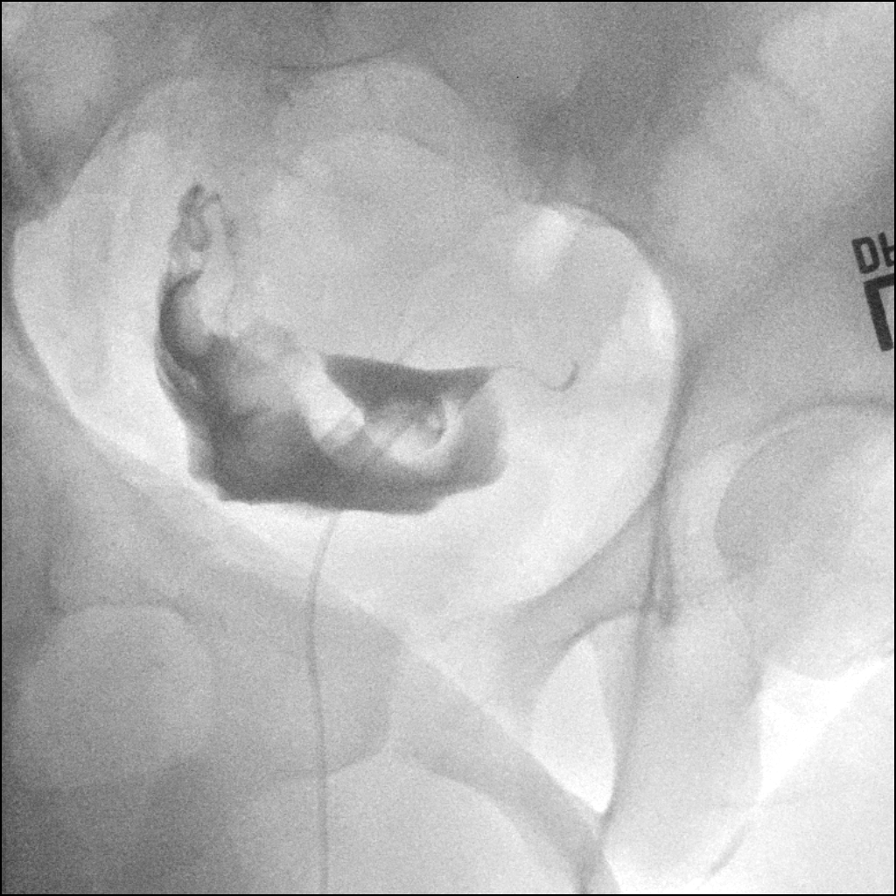

[6 of 6 positions shown; findings below may reference images not displayed]

FLUOROSCOPY TIME:  Radiation Exposure Index (as provided by the
fluoroscopic device): 220 mGy

If the device does not provide the exposure index:

Fluoroscopy Time:  42 seconds

Number of Acquired Images:  1
FINDINGS: The endometrial cavity is normal in appearance and contour. No signs
of mullerian duct anomaly.

Opacification of both fallopian tubes is seen. The right tube is
patent with intraperitoneal spill of contrast. Only the proximal
aspect of the left tube noted. No intraperitoneal spill of contrast
from the left tube noted.
IMPRESSION: 1. Patent right fallopian tube.
2. Only the proximal portion of the left fallopian tube opacified
without intraperitoneal spill of contrast.
3. Normal appearance of the uterus.

## 2021-03-01 ENCOUNTER — Other Ambulatory Visit: Payer: Self-pay | Admitting: Nurse Practitioner

## 2021-03-01 ENCOUNTER — Other Ambulatory Visit: Payer: Self-pay | Admitting: Internal Medicine

## 2021-03-01 DIAGNOSIS — I10 Essential (primary) hypertension: Secondary | ICD-10-CM

## 2021-03-16 ENCOUNTER — Ambulatory Visit: Payer: 59 | Admitting: Neurology

## 2021-04-22 ENCOUNTER — Ambulatory Visit (HOSPITAL_BASED_OUTPATIENT_CLINIC_OR_DEPARTMENT_OTHER): Payer: 59 | Admitting: Cardiovascular Disease

## 2021-05-04 ENCOUNTER — Other Ambulatory Visit: Payer: Self-pay | Admitting: Neurology

## 2021-06-14 ENCOUNTER — Encounter: Payer: Self-pay | Admitting: Internal Medicine

## 2021-06-14 ENCOUNTER — Other Ambulatory Visit: Payer: Self-pay

## 2021-06-14 ENCOUNTER — Ambulatory Visit (INDEPENDENT_AMBULATORY_CARE_PROVIDER_SITE_OTHER): Payer: 59 | Admitting: Internal Medicine

## 2021-06-14 VITALS — BP 126/80 | HR 89 | Temp 98.8°F | Ht 65.2 in | Wt 303.8 lb

## 2021-06-14 DIAGNOSIS — I10 Essential (primary) hypertension: Secondary | ICD-10-CM | POA: Diagnosis not present

## 2021-06-14 DIAGNOSIS — R635 Abnormal weight gain: Secondary | ICD-10-CM | POA: Diagnosis not present

## 2021-06-14 DIAGNOSIS — R351 Nocturia: Secondary | ICD-10-CM | POA: Diagnosis not present

## 2021-06-14 DIAGNOSIS — Z23 Encounter for immunization: Secondary | ICD-10-CM

## 2021-06-14 DIAGNOSIS — Z6841 Body Mass Index (BMI) 40.0 and over, adult: Secondary | ICD-10-CM

## 2021-06-14 DIAGNOSIS — R0602 Shortness of breath: Secondary | ICD-10-CM | POA: Diagnosis not present

## 2021-06-14 MED ORDER — METOPROLOL TARTRATE 25 MG PO TABS: ORAL_TABLET | ORAL | 1 refills | Status: DC

## 2021-06-14 MED ORDER — HYDROCHLOROTHIAZIDE 12.5 MG PO CAPS: ORAL_CAPSULE | ORAL | 1 refills | Status: DC

## 2021-06-14 NOTE — Progress Notes (Signed)
[place] as a Education administrator for Maximino Greenland, MD.,have documented all relevant documentation on the behalf of Maximino Greenland, MD,as directed by  Maximino Greenland, MD while in the presence of Maximino Greenland, MD.  This visit occurred during the SARS-CoV-2 public health emergency.  Safety protocols were in place, including screening questions prior to the visit, additional usage of staff PPE, and extensive cleaning of exam room while observing appropriate contact time as indicated for disinfecting solutions.  Subjective:     Patient ID: Courtney Meza , female    DOB: May 09, 1986 , 35 y.o.   MRN: [id number]   Chief Complaint  Patient presents with   Hypertension    HPI  Patient here for a blood pressure f/u. She reports compliance with meds.  She denies headaches, chest pain and shortness of breath.       Hypertension This is a chronic problem. The current episode started more than 1 year ago. The problem has been waxing and waning since onset. The problem is controlled. Associated symptoms include shortness of breath. Pertinent negatives include no anxiety. There are no associated agents to hypertension. Risk factors for coronary artery disease include obesity and sedentary lifestyle. Past treatments include diuretics and beta blockers. There are no compliance problems.  There is no history of angina. There is no history of chronic renal disease.    Past Medical History:  Diagnosis Date   Hypertension    Multiple sclerosis ([place])    Neuromuscular disorder ([place])    Ovarian cyst, left 12/19/2016   Vision abnormalities      Family History  Problem Relation Age of Onset   Diabetes Mother    Hypertension Mother    Healthy Father    Diabetes Maternal Grandmother    Hypertension Maternal Grandmother    Liver cancer Maternal Grandfather      Current Outpatient Medications:    Armodafinil 250 MG tablet, TAKE 1/2 TO 1 TABLET BY MOUTH EVERY MORNING, Disp: 90  tablet, Rfl: 1   baclofen (LIORESAL) 10 MG tablet, Take 1 tablet (10 mg total) by mouth 3 (three) times daily., Disp: 270 each, Rfl: 1   Cholecalciferol (VITAMIN D3) 5000 units TABS, Take 1 tablet by mouth daily., Disp: , Rfl:    Dimethyl Fumarate 240 MG CPDR, Take 1 capsule (240 mg total) by mouth in the morning and at bedtime., Disp: 60 capsule, Rfl: 11   gabapentin (NEURONTIN) 300 MG capsule, Take 1 capsule (300 mg total) by mouth 3 (three) times daily., Disp: 90 capsule, Rfl: 11   Prenatal Multivit-Min-Fe-FA (PRENATAL VITAMINS PO), Take 1 tablet by mouth daily. , Disp: , Rfl:    hydrochlorothiazide (MICROZIDE) 12.5 MG capsule, One tab po qd, Disp: 90 capsule, Rfl: 1   metoprolol tartrate (LOPRESSOR) 25 MG tablet, TAKE 1 TABLET(25 MG) BY MOUTH TWICE DAILY, Disp: 180 tablet, Rfl: 1   Allergies  Allergen Reactions   Latex Hives     Review of Systems  Constitutional:  Positive for unexpected weight change.  Respiratory:  Positive for shortness of breath.   Cardiovascular: Negative.   Genitourinary:  Positive for frequency.  Neurological: Negative.   Psychiatric/Behavioral: Negative.      Today's Vitals   06/14/21 1439  BP: 126/80  Pulse: 89  Temp: 98.8 F (37.1 C)  Weight: (!) 303 lb 12.8 oz (137.8 kg)  Height: 5' 5.2" (1.656 m)  PainSc: 0-No pain   Body mass index is 50.25 kg/m.  Wt Readings from  Last 3 Encounters:  06/14/21 (!) 303 lb 12.8 oz (137.8 kg)  12/09/20 286 lb 12.8 oz (130.1 kg)  10/22/20 278 lb 3.2 oz (126.2 kg)    Objective:  Physical Exam Vitals and nursing note reviewed.  Constitutional:      Appearance: Normal appearance.  HENT:     Head: Normocephalic and atraumatic.     Nose:     Comments: Masked     Mouth/Throat:     Comments: Masked  Cardiovascular:     Rate and Rhythm: Normal rate and regular rhythm.     Heart sounds: Normal heart sounds.  Pulmonary:     Effort: Pulmonary effort is normal.     Breath sounds: Normal breath sounds.   Musculoskeletal:     Right lower leg: Edema present.     Left lower leg: Edema present.     Comments: Non-pitting edema  Skin:    General: Skin is warm.  Neurological:     General: No focal deficit present.     Mental Status: She is alert.  Psychiatric:        Mood and Affect: Mood normal.        Behavior: Behavior normal.        Assessment And Plan:     1. Essential hypertension, benign Comments: Chronic, well controlled. Encouraged to follow low sodium diet.  I will check renal function today. I will also send refill of HCTZ.  - CMP14+EGFR - hydrochlorothiazide (MICROZIDE) 12.5 MG capsule; One tab po qd  Dispense: 90 capsule; Refill: 1  2. Weight gain Comments: She has gained 17 lbs since May. She is encouraged to incorporate more exercise into her daily routine. Advised to also eliminate sugary beverages and refined carbs.  - Insulin, random(561) - TSH - Amb Ref to Medical Weight Management - Hemoglobin A1c  3. SOB (shortness of breath) on exertion Comments: I ordered echo/Cardiology referral in May. Unfortunately, she cancelled both appts. I will check labs as listed below. Patient encouraged to reschedule both appts.  - Brain natriuretic peptide (22633) - CBC no Diff  4. Nocturia Comments: Denies snoring and early am headaches. however, she does admit to non-restorative sleep, frequent awakenings and frequent nocturia. I will refer her to Neuro for sleep study evaluation.  - Ambulatory referral to Neurology  5. Class 3 severe obesity due to excess calories with serious comorbidity and body mass index (BMI) of 50.0 to 59.9 in adult [facility]) Comments: BMI 50. She agrees to referral to MWM clinic. - Amb Ref to Medical Weight Management   Patient was given opportunity to ask questions. Patient verbalized understanding of the plan and was able to repeat key elements of the plan. All questions were answered to their satisfaction.   I, Maximino Greenland, MD, have reviewed all  documentation for this visit. The documentation on 06/14/21 for the exam, diagnosis, procedures, and orders are all accurate and complete.   IF YOU HAVE BEEN REFERRED TO A SPECIALIST, IT MAY TAKE 1-2 WEEKS TO SCHEDULE/PROCESS THE REFERRAL. IF YOU HAVE NOT HEARD FROM US/SPECIALIST IN TWO WEEKS, PLEASE GIVE [place] A CALL AT [phone] X [phone].   THE PATIENT IS ENCOURAGED TO PRACTICE SOCIAL DISTANCING DUE TO THE COVID-19 PANDEMIC.

## 2021-06-14 NOTE — Patient Instructions (Signed)

## 2021-06-15 DIAGNOSIS — R0602 Shortness of breath: Secondary | ICD-10-CM | POA: Insufficient documentation

## 2021-06-15 DIAGNOSIS — R351 Nocturia: Secondary | ICD-10-CM | POA: Insufficient documentation

## 2021-06-15 DIAGNOSIS — R635 Abnormal weight gain: Secondary | ICD-10-CM | POA: Insufficient documentation

## 2021-06-15 DIAGNOSIS — Z6841 Body Mass Index (BMI) 40.0 and over, adult: Secondary | ICD-10-CM | POA: Insufficient documentation

## 2021-06-15 DIAGNOSIS — I10 Essential (primary) hypertension: Secondary | ICD-10-CM | POA: Insufficient documentation

## 2021-06-15 LAB — CBC
Hematocrit: 36.8 % (ref 34.0–46.6)
Hemoglobin: 11.6 g/dL (ref 11.1–15.9)
MCH: 24.9 pg — ABNORMAL LOW (ref 26.6–33.0)
MCHC: 31.5 g/dL (ref 31.5–35.7)
MCV: 79 fL (ref 79–97)
Platelets: 369 10*3/uL (ref 150–450)
RBC: 4.65 x10E6/uL (ref 3.77–5.28)
RDW: 14.6 % (ref 11.7–15.4)
WBC: 3.7 10*3/uL (ref 3.4–10.8)

## 2021-06-15 LAB — CMP14+EGFR
ALT: 11 IU/L (ref 0–32)
AST: 12 IU/L (ref 0–40)
Albumin/Globulin Ratio: 1.3 (ref 1.2–2.2)
Albumin: 4.1 g/dL (ref 3.8–4.8)
Alkaline Phosphatase: 84 IU/L (ref 44–121)
BUN/Creatinine Ratio: 14 (ref 9–23)
BUN: 10 mg/dL (ref 6–20)
Bilirubin Total: 0.4 mg/dL (ref 0.0–1.2)
CO2: 27 mmol/L (ref 20–29)
Calcium: 9.4 mg/dL (ref 8.7–10.2)
Chloride: 100 mmol/L (ref 96–106)
Creatinine, Ser: 0.71 mg/dL (ref 0.57–1.00)
Globulin, Total: 3.2 g/dL (ref 1.5–4.5)
Glucose: 110 mg/dL — ABNORMAL HIGH (ref 70–99)
Potassium: 4.1 mmol/L (ref 3.5–5.2)
Sodium: 139 mmol/L (ref 134–144)
Total Protein: 7.3 g/dL (ref 6.0–8.5)
eGFR: 112 mL/min/{1.73_m2} (ref >59)

## 2021-06-15 LAB — HEMOGLOBIN A1C
Est. average glucose Bld gHb Est-mCnc: 114 mg/dL
Hgb A1c MFr Bld: 5.6 % (ref 4.8–5.6)

## 2021-06-15 LAB — TSH: TSH: 2.32 u[IU]/mL (ref 0.450–4.500)

## 2021-06-15 LAB — INSULIN, RANDOM: INSULIN: 86 u[IU]/mL — ABNORMAL HIGH (ref 2.6–24.9)

## 2021-06-15 LAB — BRAIN NATRIURETIC PEPTIDE: BNP: 60.9 pg/mL (ref 0.0–100.0)

## 2021-08-11 ENCOUNTER — Ambulatory Visit: Payer: 59 | Admitting: Neurology

## 2021-08-11 ENCOUNTER — Ambulatory Visit: Payer: 59 | Attending: Family Medicine | Admitting: Pharmacist

## 2021-08-11 ENCOUNTER — Encounter: Payer: Self-pay | Admitting: Neurology

## 2021-08-11 ENCOUNTER — Other Ambulatory Visit: Payer: Self-pay | Admitting: Pharmacist

## 2021-08-11 ENCOUNTER — Other Ambulatory Visit: Payer: Self-pay

## 2021-08-11 ENCOUNTER — Other Ambulatory Visit (HOSPITAL_COMMUNITY): Payer: Self-pay

## 2021-08-11 VITALS — BP 124/94 | HR 91 | Ht 65.2 in | Wt 295.0 lb

## 2021-08-11 DIAGNOSIS — R5382 Chronic fatigue, unspecified: Secondary | ICD-10-CM

## 2021-08-11 DIAGNOSIS — G4719 Other hypersomnia: Secondary | ICD-10-CM

## 2021-08-11 DIAGNOSIS — M62838 Other muscle spasm: Secondary | ICD-10-CM | POA: Diagnosis not present

## 2021-08-11 DIAGNOSIS — G35 Multiple sclerosis: Secondary | ICD-10-CM

## 2021-08-11 DIAGNOSIS — Z79899 Other long term (current) drug therapy: Secondary | ICD-10-CM | POA: Diagnosis not present

## 2021-08-11 DIAGNOSIS — Z7189 Other specified counseling: Secondary | ICD-10-CM

## 2021-08-11 MED ORDER — GABAPENTIN 300 MG PO CAPS
300.0000 mg | ORAL_CAPSULE | Freq: Three times a day (TID) | ORAL | 11 refills | Status: DC
  Filled 2021-08-11 – 2021-09-02 (×2): qty 90, 30d supply, fill #0
  Filled 2021-11-08: qty 90, 30d supply, fill #1

## 2021-08-11 MED ORDER — ARMODAFINIL 250 MG PO TABS
125.0000 mg | ORAL_TABLET | Freq: Every morning | ORAL | 1 refills | Status: DC
  Filled 2021-08-11 – 2021-09-02 (×2): qty 90, 90d supply, fill #0

## 2021-08-11 MED ORDER — DIMETHYL FUMARATE 240 MG PO CPDR
240.0000 mg | DELAYED_RELEASE_CAPSULE | Freq: Two times a day (BID) | ORAL | 11 refills | Status: DC
  Filled 2021-08-11 (×2): qty 60, 30d supply, fill #0
  Filled 2021-09-01: qty 60, 30d supply, fill #1
  Filled 2021-10-05: qty 60, 30d supply, fill #2
  Filled 2021-10-26: qty 60, 30d supply, fill #3
  Filled 2021-11-22 – 2022-02-11 (×3): qty 60, 30d supply, fill #4

## 2021-08-11 MED ORDER — DIMETHYL FUMARATE 240 MG PO CPDR
240.0000 mg | DELAYED_RELEASE_CAPSULE | Freq: Two times a day (BID) | ORAL | 11 refills | Status: DC
  Filled 2021-08-11: qty 60, 30d supply, fill #0

## 2021-08-11 MED ORDER — BACLOFEN 10 MG PO TABS
10.0000 mg | ORAL_TABLET | Freq: Three times a day (TID) | ORAL | 1 refills | Status: DC
  Filled 2021-08-11 (×2): qty 270, 90d supply, fill #0
  Filled 2021-11-08: qty 270, 90d supply, fill #1

## 2021-08-11 NOTE — Progress Notes (Signed)
° °  S: Patient presents today for review of their specialty medication.   Patient is currently taking generic Tecfidera for MS. Patient is managed by Dr. Felecia Shelling for this.   Dosing: 240 mg BID  Adherence: confirms   Efficacy: reports that it works well for her. MRI planned for 6 months from now.     Renal adjustment: no adjustment necessary   Hepatic adjustment: no adjustment necessary   Dose adjustment for toxicity:  Flushing, GI intolerance, or intolerance to maintenance dose: Consider temporary dose reduction to 120 mg twice daily (resume recommended maintenance dose of 240 mg twice daily within 4 weeks). Consider discontinuation in patients who cannot tolerate return to the maintenance dose.   Hepatic injury (suspected drug-induced), clinically significant: Discontinue treatment.   Lymphocyte count <500/mm3 persisting for >6 months: Consider treatment interruption.   Serious infection: Consider withholding treatment until infection resolves.   Current adverse effects: Flushing, skin rash, or puritus: occasionally, flushing occurs. Pt is able to tolerate this.  S/sx of infection: none  GI upset: none S/sx of heptotoxicity: none   O:     Lab Results  Component Value Date   WBC 3.7 06/14/2021   HGB 11.6 06/14/2021   HCT 36.8 06/14/2021   MCV 79 06/14/2021   PLT 369 06/14/2021      Chemistry      Component Value Date/Time   NA 139 06/14/2021 1532   K 4.1 06/14/2021 1532   CL 100 06/14/2021 1532   CO2 27 06/14/2021 1532   BUN 10 06/14/2021 1532   CREATININE 0.71 06/14/2021 1532   GLU 101 02/28/2018 0000      Component Value Date/Time   CALCIUM 9.4 06/14/2021 1532   ALKPHOS 84 06/14/2021 1532   AST 12 06/14/2021 1532   ALT 11 06/14/2021 1532   BILITOT 0.4 06/14/2021 1532       A/P: 1. Medication review: patient currently on Tecfidera for MS and is tolerating it well. Reviewed the medication with the patient, including the following: dimethyl fumarate  activates the Nrf2 pathway, which is believed to result in anti-inflammatory and cytoprotective properties. The medication is oral and should be swallowed whole. Administering with a high-fat, high-protein meal may decrease flushing and GI side effects. Possible adverse effects include skin flushing, pruritus, GI upset, albuminura, infection, lymphocytopenia, and increased liver transaminases. Cases of PML have been reported with severe, long-standing lymphopenia identified as the primary risk for PML. Dose adjustments for toxicities have been summarized above. No recommendations for any changes at this time.  Benard Halsted, PharmD, [place], [place] [phone]

## 2021-08-11 NOTE — Progress Notes (Signed)
[facility]  PATIENT: Courtney Meza DOB: [birth year]  REFERRING DOCTOR OR PCP:  Concha Norway (referred by) is Dr. Melba Coon;  PCP is [place] Chard SOURCE: Patient, notes from Dr. Melba Coon, MRI and lab reports, MRI images 3 onPACS.  _________________________________   HISTORICAL  CHIEF COMPLAINT:  Chief Complaint  Patient presents with   Follow-up    Rm 2, alone. Here for 6 month MS f/u, on DMF and tolerating well. No new or worsening in sx. Needing refills today.    HISTORY OF PRESENT ILLNESS:  Courtney Meza is a 36 y.o. woman with RRMS diagnosed early 2018.      Update 08/11/2021: She is on DMF (currently paying cash through [employer]) and tolerates it well.  Specifically, there are no GI symptoms of flushing.  She had been on brand Tecfidera but had insurance issues with that.  Her insurance is changing and hopefully this will be covered for her.  She reports pain and muscle tightness in the neck and there is some burning sensation there and nearby.   The neck is stiff at times.    She denies spasms in her arms or legs though she has some twitching.    Baclofen and gabapentin have helped these symptoms.        She is walking well.  Strength is fine.    Besides the upper back/neck she does not have numbness or tingling elsewhere.   Bladder is fine.    She notes vision is fine with her contacts.  No difficulty with colors.    She notes some fatigue, better with armodafinil.    She is not traveling now as much (new job).    She stopped Wellbutrin t.  No anxiety.    She denies any difficulty with her cognition.     She is sleepy during the day at times.   Her husband has not noted that she snores..     EPWORTH SLEEPINESS SCALE  On a scale of 0 - 3 what is the chance of dozing:  Sitting and Reading:   2 Watching TV:    3 Sitting inactive in a public place: 0 Passenger in car for one hour: 3 Lying down to rest in the afternoon: 3 Sitting and talking to  someone: 0 Sitting quietly after lunch:  2 In a car, stopped in traffic:  0  Total (out of 24):    13/24  (mild EDS).       MS History:   In April 2006, while driving home she had the sudden onset of reduced right vision like she was looking through a heavy fog.     Vision improved over the next year but not to baseline and has been stable since..   Later in 2006, she had the onset of right facial weakness.  She had two weeks of facial twitching and then had facial weakness that seemed to develop over minutes to one hour.  She was told she had Bell's palsy.  She is uncertain whether she had complete loss of strength in the upper face.  She had no other major symptoms over the next decade.   However, last year she had numbness in the left arm over a couple weeks The 2006 MRI showed pituitary enlargement.   Prolactin levels were recently elevated and she had a follow-up MRI showing stable pituitary enlargement and a pars intermedius cyst.   On the 09/23/2016 MRI, she had multiple white matter lesions, periventricular > juxtacortical, a left midbrain  and right pons lesion.  Brain lesions were not present on the previous MRI.    She has been on Tecfidera or DMF since 2018.     IMAGING The MRI of the brain in October 30, 2004 did not show any brain lesions.   However, the second MRI performed 11/07/2004 appeared to show diffusion weighted hyperintensity in the right optic nerve as well as enhancement of the right optic nervenot present on the original 10/30/2004 MRI.      The MRI from 09/23/2016 shows multiple T2/FLAIR hyperintense foci in the hemispheres, predominantly in the periventricular white matter but also in the juxtacortical white matter. Additionally there are left midbrain and right pontine lesions.   There is questionable optic nerve enhancement.   MRI brain 06/16/2020 showed T2/FLAIR hyperintense foci, predominantly in the periventricular and juxtacortical white matter in a pattern configuration  consistent with chronic demyelinating plaque associated with multiple sclerosis.  None of the foci appear to be acute.  They do not enhance.  Compared to the MRI dated 09/23/2016, there are no new lesions..   No acute findings.  Normal enhancement pattern.  MRI cervical spine 06/16/2020 T2 hyperintense foci at the cervicomedullary junction and within the spinal cord adjacent to C2-C3 and adjacent to C4-C5.  These are consistent with chronic demyelination associated with MS.  The focus at C2-C3 was not present on the 2019 MRI and likely represents interval development of a chronic demyelinating plaque.      Degenerative changes at C4-C5 have progressed and there is now mild to moderate spinal stenosis due to a small central disc herniation.  There is no nerve root compression  REVIEW OF SYSTEMS: Constitutional: No fevers, chills, sweats, or change in appetite.   She notes some fatigue. She sometimes has trouble with sleep. Eyes: as above Ear, nose and throat: No hearing loss, ear pain, nasal congestion, sore throat Cardiovascular: No chest pain, palpitations Respiratory:  No shortness of breath at rest or with exertion.   No wheezes GastrointestinaI: No nausea, vomiting, diarrhea, abdominal pain, fecal incontinence Genitourinary:  No dysuria, urinary retention or frequency.  No nocturia. Musculoskeletal:  No neck pain, back pain Integumentary: No rash, pruritus, skin lesions Neurological: as above Psychiatric: No depression at this time.  No anxiety Endocrine: No palpitations, diaphoresis, change in appetite, change in weigh or increased thirst Hematologic/Lymphatic:  No anemia, purpura, petechiae. Allergic/Immunologic: No itchy/runny eyes, nasal congestion, recent allergic reactions, rashes  ALLERGIES: Allergies  Allergen Reactions   Latex Hives    HOME MEDICATIONS:  Current Outpatient Medications:    Cholecalciferol (VITAMIN D3) 5000 units TABS, Take 1 tablet by mouth daily., Disp: ,  Rfl:    hydrochlorothiazide (MICROZIDE) 12.5 MG capsule, One tab po qd, Disp: 90 capsule, Rfl: 1   metoprolol tartrate (LOPRESSOR) 25 MG tablet, TAKE 1 TABLET(25 MG) BY MOUTH TWICE DAILY, Disp: 180 tablet, Rfl: 1   Prenatal Multivit-Min-Fe-FA (PRENATAL VITAMINS PO), Take 1 tablet by mouth daily. , Disp: , Rfl:    Armodafinil 250 MG tablet, Take 0.5-1 tablets (125-250 mg total) by mouth every morning., Disp: 90 tablet, Rfl: 1   baclofen (LIORESAL) 10 MG tablet, Take 1 tablet (10 mg total) by mouth 3 (three) times daily. *For the first week or so, take 1/2 tablet by mouth 3 times a day*, Disp: 270 each, Rfl: 1   Dimethyl Fumarate 240 MG CPDR, Take 1 capsule (240 mg total) by mouth in the morning and at bedtime., Disp: 60 capsule, Rfl: 11  gabapentin (NEURONTIN) 300 MG capsule, Take 1 capsule (300 mg total) by mouth 3 (three) times daily., Disp: 90 capsule, Rfl: 11  PAST MEDICAL HISTORY: Past Medical History:  Diagnosis Date   Hypertension    Multiple sclerosis ([place])    Neuromuscular disorder ([place])    Ovarian cyst, left 12/19/2016   Vision abnormalities     PAST SURGICAL HISTORY: Past Surgical History:  Procedure Laterality Date   LAPAROSCOPIC OVARIAN CYSTECTOMY Left 12/19/2016   Procedure: LAPAROSCOPIC LEFT OVARIAN CYSTECTOMY LYSIS OF ADHESIONS;  Surgeon: Janyth Contes, MD;  Location: [facility];  Service: Gynecology;  Laterality: Left;   WISDOM TOOTH EXTRACTION      FAMILY HISTORY: Family History  Problem Relation Age of Onset   Diabetes Mother    Hypertension Mother    Healthy Father    Diabetes Maternal Grandmother    Hypertension Maternal Grandmother    Liver cancer Maternal Grandfather     SOCIAL HISTORY:  Social History   Socioeconomic History   Marital status: Married    Spouse name: Not on file   Number of children: Not on file   Years of education: Not on file   Highest education level: Not on file  Occupational History   Not on file  Tobacco Use   Smoking  status: Never   Smokeless tobacco: Never  Vaping Use   Vaping Use: Never used  Substance and Sexual Activity   Alcohol use: No   Drug use: No   Sexual activity: Not on file  Other Topics Concern   Not on file  Social History Narrative   Not on file   Social Determinants of Health   Financial Resource Strain: Not on file  Food Insecurity: Not on file  Transportation Needs: Not on file  Physical Activity: Not on file  Stress: Not on file  Social Connections: Not on file  Intimate Partner Violence: Not on file     PHYSICAL EXAM  Vitals:   08/11/21 1116  BP: (!) 124/94  Pulse: 91  SpO2: 98%  Weight: 295 lb (133.8 kg)  Height: 5' 5.2" (1.656 m)    Body mass index is 48.79 kg/m.  Neck circ:  16 inches  General: The patient is well-developed and well-nourished and in no acute distress.  Pharynx is Mallampate 0.    Neurologic Exam  Mental status: The patient is alert and oriented x 3 at the time of the examination. The patient has apparent normal recent and remote memory, with an apparently normal attention span and concentration ability.   Speech is normal.  Cranial nerves: Extraocular movements are full.  Color vision is now symmetric.    There is a 1+ right APD.  Colors are symmetric. Facial strength and sensation is normal.  Trapezius strength is normal.     No obvious hearing deficits are noted.  Motor:  Muscle bulk is normal.   Tone is normal. Strength is  5 / 5 in all 4 extremities.   Sensory: She has normal sensation to touch and vibration in the arms and legs.  Coordination: Cerebellar testing shows good finger-nose-finger bilaterally.  Gait and station: Station is normal.   Her gait is normal.  The tandem gait is minimally wide. Romberg is negative.  Reflexes: Deep tendon reflexes are increased on the left...      ASSESSMENT AND PLAN  Multiple sclerosis ([place]) - Plan: Dimethyl Fumarate 240 MG CPDR, CBC with Differential/Platelet, Hepatic function  panel  Muscle spasms of both lower extremities -  Plan: baclofen (LIORESAL) 10 MG tablet  Chronic fatigue - Plan: baclofen (LIORESAL) 10 MG tablet  High risk medication use - Plan: CBC with Differential/Platelet, Hepatic function panel  Excessive daytime sleepiness - Plan: Home sleep test  Obesity, Class III, BMI 40-49.9 (morbid obesity) ([place]) - Plan: Home sleep test   1.    We will try to help her get back on DMF.  I will check a CBC with differential and LFT today.   Consider MRI of the brain later this year to determine if there is subclinical activity.   2.    Continue Nuvigil for the shift work disorder.  Despite generally sleeping well, she has excessive daytime sleepiness.  She also has obesity with a BMI equals 48.  Therefore, we will check a home sleep study. 3.    Continue vitamin D supplementation.  4.    gabapentin for dysesthesias and continue baclofen.   5.   She will return to see me in 5-6 months or sooner if there are new or worsening neurologic symptoms.     Najah Liverman A. Felecia Shelling, MD, PhD 10/10/8116, 86:77 PM Certified in Neurology, Clinical Neurophysiology, Sleep Medicine, Pain Medicine and Neuroimaging  [facility] [address] [place], [place] [address] [phone]

## 2021-08-12 ENCOUNTER — Other Ambulatory Visit (HOSPITAL_COMMUNITY): Payer: Self-pay

## 2021-08-12 ENCOUNTER — Encounter: Payer: Self-pay | Admitting: Neurology

## 2021-08-12 LAB — HEPATIC FUNCTION PANEL
ALT: 11 IU/L (ref 0–32)
AST: 12 IU/L (ref 0–40)
Albumin: 4 g/dL (ref 3.8–4.8)
Alkaline Phosphatase: 91 IU/L (ref 44–121)
Bilirubin Total: 0.2 mg/dL (ref 0.0–1.2)
Bilirubin, Direct: <0.1 mg/dL (ref 0.00–0.40)
Total Protein: 7.3 g/dL (ref 6.0–8.5)

## 2021-08-12 LAB — CBC WITH DIFFERENTIAL/PLATELET
Basophils Absolute: 0 10*3/uL (ref 0.0–0.2)
Basos: 1 %
EOS (ABSOLUTE): 0.2 10*3/uL (ref 0.0–0.4)
Eos: 4 %
Hematocrit: 35.5 % (ref 34.0–46.6)
Hemoglobin: 11.1 g/dL (ref 11.1–15.9)
Immature Grans (Abs): 0 10*3/uL (ref 0.0–0.1)
Immature Granulocytes: 0 %
Lymphocytes Absolute: 1.3 10*3/uL (ref 0.7–3.1)
Lymphs: 30 %
MCH: 24.7 pg — ABNORMAL LOW (ref 26.6–33.0)
MCHC: 31.3 g/dL — ABNORMAL LOW (ref 31.5–35.7)
MCV: 79 fL (ref 79–97)
Monocytes Absolute: 0.5 10*3/uL (ref 0.1–0.9)
Monocytes: 11 %
Neutrophils Absolute: 2.4 10*3/uL (ref 1.4–7.0)
Neutrophils: 54 %
Platelets: 362 10*3/uL (ref 150–450)
RBC: 4.49 x10E6/uL (ref 3.77–5.28)
RDW: 14.3 % (ref 11.7–15.4)
WBC: 4.4 10*3/uL (ref 3.4–10.8)

## 2021-08-26 ENCOUNTER — Institutional Professional Consult (permissible substitution): Payer: 59 | Admitting: Neurology

## 2021-09-01 ENCOUNTER — Other Ambulatory Visit (HOSPITAL_COMMUNITY): Payer: Self-pay

## 2021-09-02 ENCOUNTER — Other Ambulatory Visit (HOSPITAL_COMMUNITY): Payer: Self-pay

## 2021-09-06 ENCOUNTER — Other Ambulatory Visit (HOSPITAL_COMMUNITY): Payer: Self-pay

## 2021-09-08 ENCOUNTER — Other Ambulatory Visit (HOSPITAL_COMMUNITY): Payer: Self-pay

## 2021-09-22 ENCOUNTER — Ambulatory Visit (INDEPENDENT_AMBULATORY_CARE_PROVIDER_SITE_OTHER): Payer: 59 | Admitting: Neurology

## 2021-09-22 DIAGNOSIS — G4733 Obstructive sleep apnea (adult) (pediatric): Secondary | ICD-10-CM | POA: Diagnosis not present

## 2021-09-22 DIAGNOSIS — G4719 Other hypersomnia: Secondary | ICD-10-CM

## 2021-09-27 ENCOUNTER — Encounter: Payer: Self-pay | Admitting: Neurology

## 2021-09-27 ENCOUNTER — Other Ambulatory Visit (HOSPITAL_COMMUNITY): Payer: Self-pay

## 2021-09-27 NOTE — Progress Notes (Signed)
° °  [facility]  HOME SLEEP STUDY  STUDY DATE: 09/23/2021 PATIENT NAME: Courtney Meza DOB: 03/02/1986 MRN: [id number]  ORDERING CLINICIAN: Richard A. Felecia Shelling, MD, PhD REFERRING CLINICIAN: Richard A. Felecia Shelling, MD. PhD   CLINICAL INFORMATION: 36 year old woman with excessive daytime sleepiness (ESS equals 13/24)  FINDINGS:  Total Record Time: 9 hours 6 minutes Total Sleep Time:  8 hours 24 minutes  Percent REM:   33%   Calculated pAHI 3%:  7.8/h  REM pAHI:    16.3/h     NREM pAHI: 3.8/h ODI 4%:  1.3/h  Pulse Mean:    76 bpm pulse Range (53 to 102 bpm)    Oxygen Sat% Mean: 96%  O2Sat Range (87-100%)  O2Sat <88%: 0 minutes    IMPRESSION:  This home sleep study shows the following: Mild OSA with a pAHI 3% of 7.8.  The ODI 4% was 1.3   RECOMMENDATION: For her mild OSA, weight loss and/or an oral appliance might be optimal therapy. If no benefit, could consider AutoPap. Follow-up with Dr. Felecia Shelling.   INTERPRETING PHYSICIAN:   Richard A. Felecia Shelling, MD, PhD, [facility] Certified in Neurology, Clinical Neurophysiology, Sleep Medicine, Pain Medicine and Neuroimaging  [facility] [address], [place], [place] [address] [phone]

## 2021-10-05 ENCOUNTER — Other Ambulatory Visit (HOSPITAL_COMMUNITY): Payer: Self-pay

## 2021-10-26 ENCOUNTER — Other Ambulatory Visit (HOSPITAL_COMMUNITY): Payer: Self-pay

## 2021-11-04 ENCOUNTER — Other Ambulatory Visit (HOSPITAL_COMMUNITY): Payer: Self-pay

## 2021-11-08 ENCOUNTER — Other Ambulatory Visit (HOSPITAL_COMMUNITY): Payer: Self-pay

## 2021-11-09 ENCOUNTER — Other Ambulatory Visit (HOSPITAL_COMMUNITY): Payer: Self-pay

## 2021-11-22 ENCOUNTER — Other Ambulatory Visit (HOSPITAL_COMMUNITY): Payer: Self-pay

## 2021-12-13 ENCOUNTER — Encounter: Payer: 59 | Admitting: Internal Medicine

## 2022-01-12 ENCOUNTER — Other Ambulatory Visit (HOSPITAL_COMMUNITY): Payer: Self-pay

## 2022-01-25 ENCOUNTER — Other Ambulatory Visit (HOSPITAL_COMMUNITY): Payer: Self-pay

## 2022-02-10 ENCOUNTER — Encounter: Payer: Self-pay | Admitting: Neurology

## 2022-02-10 ENCOUNTER — Other Ambulatory Visit (HOSPITAL_COMMUNITY): Payer: Self-pay

## 2022-02-10 ENCOUNTER — Ambulatory Visit (INDEPENDENT_AMBULATORY_CARE_PROVIDER_SITE_OTHER): Payer: No Typology Code available for payment source | Admitting: Neurology

## 2022-02-10 VITALS — BP 150/105 | HR 80 | Ht 65.2 in | Wt 289.4 lb

## 2022-02-10 DIAGNOSIS — G35 Multiple sclerosis: Secondary | ICD-10-CM

## 2022-02-10 DIAGNOSIS — G4733 Obstructive sleep apnea (adult) (pediatric): Secondary | ICD-10-CM

## 2022-02-10 DIAGNOSIS — M62838 Other muscle spasm: Secondary | ICD-10-CM

## 2022-02-10 DIAGNOSIS — E559 Vitamin D deficiency, unspecified: Secondary | ICD-10-CM

## 2022-02-10 DIAGNOSIS — G4726 Circadian rhythm sleep disorder, shift work type: Secondary | ICD-10-CM | POA: Diagnosis not present

## 2022-02-10 DIAGNOSIS — G4719 Other hypersomnia: Secondary | ICD-10-CM | POA: Diagnosis not present

## 2022-02-10 DIAGNOSIS — R269 Unspecified abnormalities of gait and mobility: Secondary | ICD-10-CM

## 2022-02-10 DIAGNOSIS — Z79899 Other long term (current) drug therapy: Secondary | ICD-10-CM

## 2022-02-10 MED ORDER — BACLOFEN 10 MG PO TABS
10.0000 mg | ORAL_TABLET | Freq: Three times a day (TID) | ORAL | 1 refills | Status: AC
  Filled 2022-02-10: qty 270, 90d supply, fill #0

## 2022-02-10 MED ORDER — ARMODAFINIL 250 MG PO TABS
125.0000 mg | ORAL_TABLET | Freq: Every morning | ORAL | 1 refills | Status: DC
Start: 2022-02-10 — End: 2022-11-08
  Filled 2022-02-10: qty 30, 30d supply, fill #0

## 2022-02-10 NOTE — Progress Notes (Signed)
[facility]  PATIENT: Courtney Meza DOB: January 02, 1986  REFERRING DOCTOR OR PCP:  Concha Norway (referred by) is Dr. Melba Coon;  PCP is [place] Chard SOURCE: Patient, notes from Dr. Melba Coon, MRI and lab reports, MRI images 3 onPACS.  _________________________________   HISTORICAL  CHIEF COMPLAINT:  Chief Complaint  Patient presents with   Follow-up    RM 2, alone. Last seen 08/11/21. MS DMT: dimethyl fumarate. No concerns Changed jobs a couple months ago, did not have insurance so she cut dose to once a day. Back on twice daily dosing now that her insurance started after being at new job 60 days.     HISTORY OF PRESENT ILLNESS:  Courtney Meza is a 36 y.o. woman with RRMS diagnosed early 2018.      Update 02/10/2022: She is on DMF  and tolerates it well.  She has some GI symptoms that are tolerable.    .  She is walking well.  Strength is fine.  No spasticity.   She usually uses the bannister but does not need to.    Rare tingling in neck but nothing bad.   No numbness.   Bladder is fine.    She notes vision is fine with her contacts.  No difficulty with colors.    She has fatigue, better with armodafinil.   She finds she sleeps extra on days she does not work.   She sleep 7-8 hours a night but 12 hours a 24 hour day   She takes a nap many days (works 7 pm to 7 am 3 days a week) on        Mood is fine.  Cognition is fine.     Neck pain is better..    Baclofen and gabapentin have helped these symptoms.        She is sleepy during the day at times.   Her husband has not noted that she snores..     EPWORTH SLEEPINESS SCALE  On a scale of 0 - 3 what is the chance of dozing:  Sitting and Reading:   2 Watching TV:    3 Sitting inactive in a public place: 0 Passenger in car for one hour: 3 Lying down to rest in the afternoon: 3 Sitting and talking to someone: 0 Sitting quietly after lunch:  2 In a car, stopped in traffic:  0  Total (out of 24):    13/24  (mild  EDS).       MS History:   In April 2006, while driving home she had the sudden onset of reduced right vision like she was looking through a heavy fog.     Vision improved over the next year but not to baseline and has been stable since..   Later in 2006, she had the onset of right facial weakness.  She had two weeks of facial twitching and then had facial weakness that seemed to develop over minutes to one hour.  She was told she had Bell's palsy.  She is uncertain whether she had complete loss of strength in the upper face.  She had no other major symptoms over the next decade.   However, last year she had numbness in the left arm over a couple weeks The 2006 MRI showed pituitary enlargement.   Prolactin levels were recently elevated and she had a follow-up MRI showing stable pituitary enlargement and a pars intermedius cyst.   On the 09/23/2016 MRI, she had multiple white matter lesions, periventricular > juxtacortical,  a left midbrain and right pons lesion.  Brain lesions were not present on the previous MRI.    She has been on Tecfidera or DMF since 2018.     IMAGING The MRI of the brain in October 30, 2004 did not show any brain lesions.   However, the second MRI performed 11/07/2004 appeared to show diffusion weighted hyperintensity in the right optic nerve as well as enhancement of the right optic nervenot present on the original 10/30/2004 MRI.      The MRI from 09/23/2016 shows multiple T2/FLAIR hyperintense foci in the hemispheres, predominantly in the periventricular white matter but also in the juxtacortical white matter. Additionally there are left midbrain and right pontine lesions.   There is questionable optic nerve enhancement.   MRI brain 06/16/2020 showed T2/FLAIR hyperintense foci, predominantly in the periventricular and juxtacortical white matter in a pattern configuration consistent with chronic demyelinating plaque associated with multiple sclerosis.  None of the foci appear to be  acute.  They do not enhance.  Compared to the MRI dated 09/23/2016, there are no new lesions..   No acute findings.  Normal enhancement pattern.  MRI cervical spine 06/16/2020 T2 hyperintense foci at the cervicomedullary junction and within the spinal cord adjacent to C2-C3 and adjacent to C4-C5.  These are consistent with chronic demyelination associated with MS.  The focus at C2-C3 was not present on the 2019 MRI and likely represents interval development of a chronic demyelinating plaque.      Degenerative changes at C4-C5 have progressed and there is now mild to moderate spinal stenosis due to a small central disc herniation.  There is no nerve root compression  REVIEW OF SYSTEMS: Constitutional: No fevers, chills, sweats, or change in appetite.   She notes some fatigue. She sometimes has trouble with sleep. Eyes: as above Ear, nose and throat: No hearing loss, ear pain, nasal congestion, sore throat Cardiovascular: No chest pain, palpitations Respiratory:  No shortness of breath at rest or with exertion.   No wheezes GastrointestinaI: No nausea, vomiting, diarrhea, abdominal pain, fecal incontinence Genitourinary:  No dysuria, urinary retention or frequency.  No nocturia. Musculoskeletal:  No neck pain, back pain Integumentary: No rash, pruritus, skin lesions Neurological: as above Psychiatric: No depression at this time.  No anxiety Endocrine: No palpitations, diaphoresis, change in appetite, change in weigh or increased thirst Hematologic/Lymphatic:  No anemia, purpura, petechiae. Allergic/Immunologic: No itchy/runny eyes, nasal congestion, recent allergic reactions, rashes  ALLERGIES: Allergies  Allergen Reactions   Latex Hives    HOME MEDICATIONS:  Current Outpatient Medications:    Cholecalciferol (VITAMIN D3) 5000 units TABS, Take 1 tablet by mouth daily., Disp: , Rfl:    Dimethyl Fumarate 240 MG CPDR, Take 1 capsule (240 mg total) by mouth in the morning and at bedtime.,  Disp: 60 capsule, Rfl: 11   gabapentin (NEURONTIN) 300 MG capsule, Take 1 capsule (300 mg total) by mouth 3 (three) times daily., Disp: 90 capsule, Rfl: 11   hydrochlorothiazide (MICROZIDE) 12.5 MG capsule, One tab po qd, Disp: 90 capsule, Rfl: 1   metoprolol tartrate (LOPRESSOR) 25 MG tablet, TAKE 1 TABLET(25 MG) BY MOUTH TWICE DAILY, Disp: 180 tablet, Rfl: 1   Prenatal Multivit-Min-Fe-FA (PRENATAL VITAMINS PO), Take 1 tablet by mouth daily. , Disp: , Rfl:    Armodafinil 250 MG tablet, Take 0.5-1 tablets (125-250 mg total) by mouth every morning., Disp: 90 tablet, Rfl: 1   baclofen (LIORESAL) 10 MG tablet, For the first week or so, take  1/2 tablet by mouth 3 times a day. Then take 1 tablet by mouth three times a day, Disp: 270 each, Rfl: 1  PAST MEDICAL HISTORY: Past Medical History:  Diagnosis Date   Hypertension    Multiple sclerosis ([place])    Neuromuscular disorder ([place])    Ovarian cyst, left 12/19/2016   Vision abnormalities     PAST SURGICAL HISTORY: Past Surgical History:  Procedure Laterality Date   LAPAROSCOPIC OVARIAN CYSTECTOMY Left 12/19/2016   Procedure: LAPAROSCOPIC LEFT OVARIAN CYSTECTOMY LYSIS OF ADHESIONS;  Surgeon: Janyth Contes, MD;  Location: [facility];  Service: Gynecology;  Laterality: Left;   WISDOM TOOTH EXTRACTION      FAMILY HISTORY: Family History  Problem Relation Age of Onset   Diabetes Mother    Hypertension Mother    Healthy Father    Diabetes Maternal Grandmother    Hypertension Maternal Grandmother    Liver cancer Maternal Grandfather     SOCIAL HISTORY:  Social History   Socioeconomic History   Marital status: Married    Spouse name: Not on file   Number of children: Not on file   Years of education: Not on file   Highest education level: Not on file  Occupational History   Not on file  Tobacco Use   Smoking status: Never   Smokeless tobacco: Never  Vaping Use   Vaping Use: Never used  Substance and Sexual Activity   Alcohol  use: No   Drug use: No   Sexual activity: Not on file  Other Topics Concern   Not on file  Social History Narrative   Not on file   Social Determinants of Health   Financial Resource Strain: Not on file  Food Insecurity: Not on file  Transportation Needs: Not on file  Physical Activity: Not on file  Stress: Not on file  Social Connections: Not on file  Intimate Partner Violence: Not on file     PHYSICAL EXAM  Vitals:   02/10/22 1115  BP: (!) 150/105  Pulse: 80  Weight: 289 lb 6.4 oz (131.3 kg)  Height: 5' 5.2" (1.656 m)    Body mass index is 47.86 kg/m.  Neck circ:  16 inches  General: The patient is well-developed and well-nourished and in no acute distress.  Pharynx is Mallampate 0.    Neurologic Exam  Mental status: The patient is alert and oriented x 3 at the time of the examination. The patient has apparent normal recent and remote memory, with an apparently normal attention span and concentration ability.   Speech is normal.  Cranial nerves: Extraocular movements are full.  Color vision is now symmetric.    Colors are symmetric. Facial strength and sensation is normal.  Trapezius strength is normal.     No obvious hearing deficits are noted.  Motor:  Muscle bulk is normal.   Tone is normal. Strength is  5 / 5 in all 4 extremities.   Sensory: She has normal sensation to touch and vibration in the arms and legs.  Coordination: Cerebellar testing shows good finger-nose-finger bilaterally.  Gait and station: Station is normal.   Her gait is normal.  The tandem gait is mildly  wide. Romberg is negative.  Reflexes: Deep tendon reflexes are increased on the left...      ASSESSMENT AND PLAN  Multiple sclerosis ([place]) - Plan: CBC with Differential/Platelet, Hepatic function panel  Muscle spasms of both lower extremities - Plan: baclofen (LIORESAL) 10 MG tablet  Excessive daytime sleepiness  Shift  work sleep disorder  OSA (obstructive sleep apnea)  Gait  disturbance  Vitamin D deficiency - Plan: VITAMIN D 25 Hydroxy (Vit-D Deficiency, Fractures)  High risk medication use - Plan: CBC with Differential/Platelet, Hepatic function panel   1.    Continue DMF.  I will check a CBC with differential and LFT today.  Check MRI of the brain around time of next visit  to determine if there is subclinical activity.   2.    Continue Nuvigil for the shift work disorder/EDS/OSA.    3.    Continue vitamin D supplementation.  4.    gabapentin prn dysesthesias and baclofen.   5.   Referral to weight loss clinic.  She would prefer to go to [facility].   6.   She will return to see me in 5-6 months or sooner if there are new or worsening neurologic symptoms.     Eugean Arnott A. Felecia Shelling, MD, PhD 7/34/1937, 90:24 AM Certified in Neurology, Clinical Neurophysiology, Sleep Medicine, Pain Medicine and Neuroimaging  [facility] [address], [place], [place] [address] [phone]

## 2022-02-11 LAB — CBC WITH DIFFERENTIAL/PLATELET
Basophils Absolute: 0 10*3/uL (ref 0.0–0.2)
Basos: 1 %
EOS (ABSOLUTE): 0.2 10*3/uL (ref 0.0–0.4)
Eos: 3 %
Hematocrit: 34.4 % (ref 34.0–46.6)
Hemoglobin: 10.9 g/dL — ABNORMAL LOW (ref 11.1–15.9)
Immature Grans (Abs): 0 10*3/uL (ref 0.0–0.1)
Immature Granulocytes: 0 %
Lymphocytes Absolute: 1.5 10*3/uL (ref 0.7–3.1)
Lymphs: 30 %
MCH: 24.7 pg — ABNORMAL LOW (ref 26.6–33.0)
MCHC: 31.7 g/dL (ref 31.5–35.7)
MCV: 78 fL — ABNORMAL LOW (ref 79–97)
Monocytes Absolute: 0.4 10*3/uL (ref 0.1–0.9)
Monocytes: 9 %
Neutrophils Absolute: 2.9 10*3/uL (ref 1.4–7.0)
Neutrophils: 57 %
Platelets: 371 10*3/uL (ref 150–450)
RBC: 4.42 x10E6/uL (ref 3.77–5.28)
RDW: 14.6 % (ref 11.7–15.4)
WBC: 5.1 10*3/uL (ref 3.4–10.8)

## 2022-02-11 LAB — HEPATIC FUNCTION PANEL
ALT: 9 IU/L (ref 0–32)
AST: 10 IU/L (ref 0–40)
Albumin: 3.9 g/dL (ref 3.9–4.9)
Alkaline Phosphatase: 95 IU/L (ref 44–121)
Bilirubin Total: <0.2 mg/dL (ref 0.0–1.2)
Bilirubin, Direct: <0.1 mg/dL (ref 0.00–0.40)
Total Protein: 7 g/dL (ref 6.0–8.5)

## 2022-02-11 LAB — VITAMIN D 25 HYDROXY (VIT D DEFICIENCY, FRACTURES): Vit D, 25-Hydroxy: 54.7 ng/mL (ref 30.0–100.0)

## 2022-02-14 ENCOUNTER — Telehealth: Payer: Self-pay | Admitting: Neurology

## 2022-02-14 NOTE — Telephone Encounter (Signed)
Referral for INTERNAL MEDICINE sent to [facility] [phone].

## 2022-02-15 ENCOUNTER — Other Ambulatory Visit: Payer: Self-pay | Admitting: *Deleted

## 2022-02-15 ENCOUNTER — Other Ambulatory Visit (HOSPITAL_COMMUNITY): Payer: Self-pay

## 2022-02-15 ENCOUNTER — Encounter: Payer: Self-pay | Admitting: Neurology

## 2022-02-15 DIAGNOSIS — G35 Multiple sclerosis: Secondary | ICD-10-CM

## 2022-02-15 MED ORDER — DIMETHYL FUMARATE 240 MG PO CPDR: DELAYED_RELEASE_CAPSULE | ORAL | 11 refills | Status: DC

## 2022-02-16 ENCOUNTER — Telehealth: Payer: Self-pay | Admitting: *Deleted

## 2022-02-16 NOTE — Telephone Encounter (Signed)
Submitted PA dimethyl fumarate on CMM. Key:  [id number] - PA Case ID: [id number]. Waiting on determination from [facility].

## 2022-02-17 NOTE — Telephone Encounter (Signed)
PA approved for the pt until 02/17/2023 PA# [id number]

## 2022-03-29 ENCOUNTER — Encounter (INDEPENDENT_AMBULATORY_CARE_PROVIDER_SITE_OTHER): Payer: Self-pay

## 2022-07-20 ENCOUNTER — Ambulatory Visit: Payer: No Typology Code available for payment source | Admitting: Internal Medicine

## 2022-07-20 ENCOUNTER — Encounter: Payer: Self-pay | Admitting: Internal Medicine

## 2022-07-20 VITALS — BP 130/72 | HR 95 | Temp 98.3°F | Ht 65.2 in | Wt 286.0 lb

## 2022-07-20 DIAGNOSIS — D649 Anemia, unspecified: Secondary | ICD-10-CM | POA: Insufficient documentation

## 2022-07-20 DIAGNOSIS — Z23 Encounter for immunization: Secondary | ICD-10-CM

## 2022-07-20 DIAGNOSIS — D508 Other iron deficiency anemias: Secondary | ICD-10-CM | POA: Diagnosis not present

## 2022-07-20 DIAGNOSIS — I10 Essential (primary) hypertension: Secondary | ICD-10-CM

## 2022-07-20 DIAGNOSIS — R3915 Urgency of urination: Secondary | ICD-10-CM | POA: Diagnosis not present

## 2022-07-20 DIAGNOSIS — D229 Melanocytic nevi, unspecified: Secondary | ICD-10-CM

## 2022-07-20 DIAGNOSIS — Z6841 Body Mass Index (BMI) 40.0 and over, adult: Secondary | ICD-10-CM

## 2022-07-20 DIAGNOSIS — G35 Multiple sclerosis: Secondary | ICD-10-CM | POA: Diagnosis not present

## 2022-07-20 LAB — POCT URINALYSIS DIPSTICK
Bilirubin, UA: NEGATIVE
Glucose, UA: NEGATIVE
Leukocytes, UA: NEGATIVE
Nitrite, UA: NEGATIVE
Protein, UA: POSITIVE — AB
Spec Grav, UA: 1.02 (ref 1.010–1.025)
Urobilinogen, UA: 1 E.U./dL
pH, UA: 6 (ref 5.0–8.0)

## 2022-07-20 MED ORDER — HYDROCHLOROTHIAZIDE 12.5 MG PO CAPS: ORAL_CAPSULE | ORAL | 1 refills | Status: DC

## 2022-07-20 MED ORDER — METOPROLOL TARTRATE 25 MG PO TABS: ORAL_TABLET | ORAL | 1 refills | Status: DC

## 2022-07-20 NOTE — Patient Instructions (Signed)
Hypertension, Adult High blood pressure (hypertension) is when the force of blood pumping through the arteries is too strong. The arteries are the blood vessels that carry blood from the heart throughout the body. Hypertension forces the heart to work harder to pump blood and may cause arteries to become narrow or stiff. Untreated or uncontrolled hypertension can lead to a heart attack, heart failure, a stroke, kidney disease, and other problems. A blood pressure reading consists of a higher number over a lower number. Ideally, your blood pressure should be below 120/80. The first ("top") number is called the systolic pressure. It is a measure of the pressure in your arteries as your heart beats. The second ("bottom") number is called the diastolic pressure. It is a measure of the pressure in your arteries as the heart relaxes. What are the causes? The exact cause of this condition is not known. There are some conditions that result in high blood pressure. What increases the risk? Certain factors may make you more likely to develop high blood pressure. Some of these risk factors are under your control, including: Smoking. Not getting enough exercise or physical activity. Being overweight. Having too much fat, sugar, calories, or salt (sodium) in your diet. Drinking too much alcohol. Other risk factors include: Having a personal history of heart disease, diabetes, high cholesterol, or kidney disease. Stress. Having a family history of high blood pressure and high cholesterol. Having obstructive sleep apnea. Age. The risk increases with age. What are the signs or symptoms? High blood pressure may not cause symptoms. Very high blood pressure (hypertensive crisis) may cause: Headache. Fast or irregular heartbeats (palpitations). Shortness of breath. Nosebleed. Nausea and vomiting. Vision changes. Severe chest pain, dizziness, and seizures. How is this diagnosed? This condition is diagnosed by  measuring your blood pressure while you are seated, with your arm resting on a flat surface, your legs uncrossed, and your feet flat on the floor. The cuff of the blood pressure monitor will be placed directly against the skin of your upper arm at the level of your heart. Blood pressure should be measured at least twice using the same arm. Certain conditions can cause a difference in blood pressure between your right and left arms. If you have a high blood pressure reading during one visit or you have normal blood pressure with other risk factors, you may be asked to: Return on a different day to have your blood pressure checked again. Monitor your blood pressure at home for 1 week or longer. If you are diagnosed with hypertension, you may have other blood or imaging tests to help your health care provider understand your overall risk for other conditions. How is this treated? This condition is treated by making healthy lifestyle changes, such as eating healthy foods, exercising more, and reducing your alcohol intake. You may be referred for counseling on a healthy diet and physical activity. Your health care provider may prescribe medicine if lifestyle changes are not enough to get your blood pressure under control and if: Your systolic blood pressure is above 130. Your diastolic blood pressure is above 80. Your personal target blood pressure may vary depending on your medical conditions, your age, and other factors. Follow these instructions at home: Eating and drinking  Eat a diet that is high in fiber and potassium, and low in sodium, added sugar, and fat. An example of this eating plan is called the DASH diet. DASH stands for Dietary Approaches to Stop Hypertension. To eat this way: Eat   plenty of fresh fruits and vegetables. Try to fill one half of your plate at each meal with fruits and vegetables. Eat whole grains, such as whole-wheat pasta, brown rice, or whole-grain bread. Fill about one  fourth of your plate with whole grains. Eat or drink low-fat dairy products, such as skim milk or low-fat yogurt. Avoid fatty cuts of meat, processed or cured meats, and poultry with skin. Fill about one fourth of your plate with lean proteins, such as fish, chicken without skin, beans, eggs, or tofu. Avoid pre-made and processed foods. These tend to be higher in sodium, added sugar, and fat. Reduce your daily sodium intake. Many people with hypertension should eat less than 1,500 mg of sodium a day. Do not drink alcohol if: Your health care provider tells you not to drink. You are pregnant, may be pregnant, or are planning to become pregnant. If you drink alcohol: Limit how much you have to: 0-1 drink a day for women. 0-2 drinks a day for men. Know how much alcohol is in your drink. In the [place] one drink equals one 12 oz bottle of beer (355 mL), one 5 oz glass of wine (148 mL), or one 1 oz glass of hard liquor (44 mL). Lifestyle  Work with your health care provider to maintain a healthy body weight or to lose weight. Ask what an ideal weight is for you. Get at least 30 minutes of exercise that causes your heart to beat faster (aerobic exercise) most days of the week. Activities may include walking, swimming, or biking. Include exercise to strengthen your muscles (resistance exercise), such as Pilates or lifting weights, as part of your weekly exercise routine. Try to do these types of exercises for 30 minutes at least 3 days a week. Do not use any products that contain nicotine or tobacco. These products include cigarettes, chewing tobacco, and vaping devices, such as e-cigarettes. If you need help quitting, ask your health care provider. Monitor your blood pressure at home as told by your health care provider. Keep all follow-up visits. This is important. Medicines Take over-the-counter and prescription medicines only as told by your health care provider. Follow directions carefully. Blood  pressure medicines must be taken as prescribed. Do not skip doses of blood pressure medicine. Doing this puts you at risk for problems and can make the medicine less effective. Ask your health care provider about side effects or reactions to medicines that you should watch for. Contact a health care provider if you: Think you are having a reaction to a medicine you are taking. Have headaches that keep coming back (recurring). Feel dizzy. Have swelling in your ankles. Have trouble with your vision. Get help right away if you: Develop a severe headache or confusion. Have unusual weakness or numbness. Feel faint. Have severe pain in your chest or abdomen. Vomit repeatedly. Have trouble breathing. These symptoms may be an emergency. Get help right away. Call 911. Do not wait to see if the symptoms will go away. Do not drive yourself to the hospital. Summary Hypertension is when the force of blood pumping through your arteries is too strong. If this condition is not controlled, it may put you at risk for serious complications. Your personal target blood pressure may vary depending on your medical conditions, your age, and other factors. For most people, a normal blood pressure is less than 120/80. Hypertension is treated with lifestyle changes, medicines, or a combination of both. Lifestyle changes include losing weight, eating a healthy,   low-sodium diet, exercising more, and limiting alcohol. This information is not intended to replace advice given to you by your health care provider. Make sure you discuss any questions you have with your health care provider. Document Revised: 05/25/2021 Document Reviewed: 05/25/2021 Elsevier Patient Education  2023 [employer]vier Inc.  

## 2022-07-20 NOTE — Progress Notes (Unsigned)
I,Tianna Badgett,acting as a Education administrator for Maximino Greenland, MD.,have documented all relevant documentation on the behalf of Maximino Greenland, MD,as directed by  Maximino Greenland, MD while in the presence of Maximino Greenland, MD.  Subjective:     Patient ID: Courtney Meza , female    DOB: Jul 28, 1986 , 36 y.o.   MRN: [id number]   Chief Complaint  Patient presents with   Hypertension    HPI  Patient here for a blood pressure f/u. She reports compliance with meds.  She denies headaches, chest pain and shortness of breath.       Hypertension This is a chronic problem. The current episode started more than 1 year ago. The problem has been waxing and waning since onset. The problem is controlled. Pertinent negatives include no anxiety. There are no associated agents to hypertension. Risk factors for coronary artery disease include obesity and sedentary lifestyle. Past treatments include diuretics and beta blockers. There are no compliance problems.  There is no history of angina. There is no history of chronic renal disease.     Past Medical History:  Diagnosis Date   Hypertension    Multiple sclerosis ([place])    Neuromuscular disorder ([place])    Ovarian cyst, left 12/19/2016   Vision abnormalities      Family History  Problem Relation Age of Onset   Diabetes Mother    Hypertension Mother    Healthy Father    Diabetes Maternal Grandmother    Hypertension Maternal Grandmother    Liver cancer Maternal Grandfather      Current Outpatient Medications:    ferrous sulfate 325 (65 FE) MG EC tablet, Take 325 mg by mouth 3 (three) times daily with meals., Disp: , Rfl:    Armodafinil 250 MG tablet, Take 1/2 to 1 tablet (125-250 mg total) by mouth every morning., Disp: 90 tablet, Rfl: 1   baclofen (LIORESAL) 10 MG tablet, First week or so, take 1/2 tablet by mouth 3 times a day, then take 1 tablet by mouth 3 times a day, Disp: 270 each, Rfl: 1   Cholecalciferol (VITAMIN D3) 5000 units TABS,  Take 1 tablet by mouth daily., Disp: , Rfl:    Dimethyl Fumarate 240 MG CPDR, Take 1 capsule by mouth twice daily, Disp: 60 capsule, Rfl: 11   gabapentin (NEURONTIN) 300 MG capsule, Take 1 capsule (300 mg total) by mouth 3 (three) times daily., Disp: 90 capsule, Rfl: 11   hydrochlorothiazide (MICROZIDE) 12.5 MG capsule, One tab po qd, Disp: 90 capsule, Rfl: 1   metoprolol tartrate (LOPRESSOR) 25 MG tablet, TAKE 1 TABLET(25 MG) BY MOUTH TWICE DAILY, Disp: 180 tablet, Rfl: 1   Prenatal Multivit-Min-Fe-FA (PRENATAL VITAMINS PO), Take 1 tablet by mouth daily. , Disp: , Rfl:    Allergies  Allergen Reactions   Latex Hives     Review of Systems  Constitutional: Negative.   Respiratory: Negative.    Cardiovascular: Negative.   Gastrointestinal: Negative.   Skin:        She is concerned about a mole on her face. Wants to see Derm.   Neurological: Negative.      Today's Vitals   07/20/22 1528  BP: 130/72  Pulse: 95  Temp: 98.3 F (36.8 C)  TempSrc: Oral  Weight: 286 lb (129.7 kg)  Height: 5' 5.2" (1.656 m)   Body mass index is 47.3 kg/m.  Wt Readings from Last 3 Encounters:  07/20/22 286 lb (129.7 kg)  02/10/22 289 lb 6.4 oz (  131.3 kg)  08/11/21 295 lb (133.8 kg)    Objective:  Physical Exam Vitals and nursing note reviewed.  Constitutional:      Appearance: Normal appearance. She is obese.  HENT:     Head: Normocephalic and atraumatic.     Nose:     Comments: Masked     Mouth/Throat:     Comments: Masked  Eyes:     Extraocular Movements: Extraocular movements intact.  Cardiovascular:     Rate and Rhythm: Normal rate and regular rhythm.     Heart sounds: Normal heart sounds.  Pulmonary:     Effort: Pulmonary effort is normal.     Breath sounds: Normal breath sounds.  Musculoskeletal:     Cervical back: Normal range of motion.  Skin:    General: Skin is warm.  Neurological:     General: No focal deficit present.     Mental Status: She is alert.  Psychiatric:         Mood and Affect: Mood normal.        Behavior: Behavior normal.      Assessment And Plan:     1. Essential hypertension, benign Comments: Chronic, fair control. Goal BP<130/80. She is encouraged to follow a low sodium diet. I will check renal function. She agrees to rto in 6 months for CPE. - CMP14+EGFR - Lipid panel - hydrochlorothiazide (MICROZIDE) 12.5 MG capsule; One tab po qd  Dispense: 90 capsule; Refill: 1 - metoprolol tartrate (LOPRESSOR) 25 MG tablet; TAKE 1 TABLET(25 MG) BY MOUTH TWICE DAILY  Dispense: 180 tablet; Refill: 1  2. Other iron deficiency anemia Comments: Labs reviewed in Care Everywhere, I will recheck levels today as requested by [facility]. - Iron, TIBC and Ferritin Panel  3. Multiple sclerosis ([place]) Comments: Chronic, most recent Neuro note reviewed.  She will c/w dimethyl fumarate as per Neuro.  4. Urinary urgency - POCT Urinalysis Dipstick (08144) - Urine Culture  5. Nevus Comments: Located on her face. I wil refer her to Derm for further evaluation and possible excision. - Ambulatory referral to Dermatology  6. Class 3 severe obesity due to excess calories with serious comorbidity and body mass index (BMI) of 45.0 to 49.9 in adult [facility]) Comments: BMI 47. She is pursuing bariatric surgery at [facility]. She thinks she will proceed with gastric sleeve vs Roux-en-Y procedure.  7. Need for influenza vaccination - Flu Vaccine QUAD 6+ mos PF IM (Fluarix Quad PF)   Patient was given opportunity to ask questions. Patient verbalized understanding of the plan and was able to repeat key elements of the plan. All questions were answered to their satisfaction.   I, Maximino Greenland, MD, have reviewed all documentation for this visit. The documentation on 07/20/22 for the exam, diagnosis, procedures, and orders are all accurate and complete.   IF YOU HAVE BEEN REFERRED TO A SPECIALIST, IT MAY TAKE 1-2 WEEKS TO SCHEDULE/PROCESS THE REFERRAL. IF YOU HAVE NOT HEARD  FROM US/SPECIALIST IN TWO WEEKS, PLEASE GIVE [place] A CALL AT [phone] X [phone].   THE PATIENT IS ENCOURAGED TO PRACTICE SOCIAL DISTANCING DUE TO THE COVID-19 PANDEMIC.

## 2022-07-21 LAB — CMP14+EGFR
ALT: 15 IU/L (ref 0–32)
AST: 12 IU/L (ref 0–40)
Albumin/Globulin Ratio: 1.3 (ref 1.2–2.2)
Albumin: 4.3 g/dL (ref 3.9–4.9)
Alkaline Phosphatase: 96 IU/L (ref 44–121)
BUN/Creatinine Ratio: 14 (ref 9–23)
BUN: 10 mg/dL (ref 6–20)
Bilirubin Total: <0.2 mg/dL (ref 0.0–1.2)
CO2: 26 mmol/L (ref 20–29)
Calcium: 9.8 mg/dL (ref 8.7–10.2)
Chloride: 97 mmol/L (ref 96–106)
Creatinine, Ser: 0.74 mg/dL (ref 0.57–1.00)
Globulin, Total: 3.3 g/dL (ref 1.5–4.5)
Glucose: 98 mg/dL (ref 70–99)
Potassium: 3.8 mmol/L (ref 3.5–5.2)
Sodium: 136 mmol/L (ref 134–144)
Total Protein: 7.6 g/dL (ref 6.0–8.5)
eGFR: 106 mL/min/{1.73_m2} (ref >59)

## 2022-07-21 LAB — IRON,TIBC AND FERRITIN PANEL
Ferritin: 75 ng/mL (ref 15–150)
Iron Saturation: 21 % (ref 15–55)
Iron: 70 ug/dL (ref 27–159)
Total Iron Binding Capacity: 328 ug/dL (ref 250–450)
UIBC: 258 ug/dL (ref 131–425)

## 2022-07-21 LAB — LIPID PANEL
Chol/HDL Ratio: 4.6 ratio — ABNORMAL HIGH (ref 0.0–4.4)
Cholesterol, Total: 211 mg/dL — ABNORMAL HIGH (ref 100–199)
HDL: 46 mg/dL (ref >39)
LDL Chol Calc (NIH): 146 mg/dL — ABNORMAL HIGH (ref 0–99)
Triglycerides: 105 mg/dL (ref 0–149)
VLDL Cholesterol Cal: 19 mg/dL (ref 5–40)

## 2022-07-22 LAB — URINE CULTURE

## 2022-08-17 NOTE — Patient Instructions (Signed)
Below is our plan:  We will continue current treatment plan. Consider [place] exercises as discussed and let me know if symptoms worsen.   Please make sure you are staying well hydrated. I recommend 50-60 ounces daily. Well balanced diet and regular exercise encouraged. Consistent sleep schedule with 6-8 hours recommended.   Please continue follow up with care team as directed.   Follow up with Dr Felecia Shelling in 6 months   You may receive a survey regarding today's visit. I encourage you to leave honest feed back as I do use this information to improve patient care. Thank you for seeing me today!

## 2022-08-17 NOTE — Progress Notes (Signed)
PATIENT: Courtney Meza DOB: [birth year]  REASON FOR VISIT: follow up HISTORY FROM: patient  Chief Complaint  Patient presents with   Room 2    Pt is here Alone. Pt states that everything has been going okay with her MS. Pt states no muscle weakness. Pt states that she deals with a lot of Fatigue. Pt states no burning or tingling sensation in her feet and legs.      HISTORY OF PRESENT ILLNESS:  08/18/22 ALL: Courtney Meza is a 37 y.o. female here today for follow up for MS. She continues DMF and tolerating well. Last MR brian 06/2020 stable, cervical spine showed one new focus at C2-C3.   She feels that MS is stable. No changes in gait. No falls.   She continues baclofen and gabapentin PRN. Usually takes each 2-3 times per week. She feels they make her groggy if she takes regularly.   She continues to have fatigue. She feels that armodafinil helps with this. She usually takes it on the weekends when working.   Vision or bowel changes. She has noticed some urinary urgency at times. She may have some dribbling at times. No frank incontinence.    HISTORY: (copied from Dr Garth Bigness previous note)  Courtney Meza is a 37 y.o. woman with RRMS diagnosed early 2018.       Update 02/10/2022: She is on DMF  and tolerates it well.  She has some GI symptoms that are tolerable.    .   She is walking well.  Strength is fine.  No spasticity.   She usually uses the bannister but does not need to.    Rare tingling in neck but nothing bad.   No numbness.   Bladder is fine.    She notes vision is fine with her contacts.  No difficulty with colors.     She has fatigue, better with armodafinil.   She finds she sleeps extra on days she does not work.   She sleep 7-8 hours a night but 12 hours a 24 hour day   She takes a nap many days (works 7 pm to 7 am 3 days a week) on         Mood is fine.  Cognition is fine.      Neck pain is better..    Baclofen and gabapentin have helped these  symptoms.         She is sleepy during the day at times.   Her husband has not noted that she snores..      EPWORTH SLEEPINESS SCALE   On a scale of 0 - 3 what is the chance of dozing:   Sitting and Reading:                           2 Watching TV:                                      3 Sitting inactive in a public place:        0 Passenger in car for one hour:           3 Lying down to rest in the afternoon:   3 Sitting and talking to someone:          0 Sitting quietly after lunch:  2 In a car, stopped in traffic:                  0   Total (out of 24):    13/24  (mild EDS).        MS History:   In April 2006, while driving home she had the sudden onset of reduced right vision like she was looking through a heavy fog.     Vision improved over the next year but not to baseline and has been stable since..   Later in 2006, she had the onset of right facial weakness.  She had two weeks of facial twitching and then had facial weakness that seemed to develop over minutes to one hour.  She was told she had Bell's palsy.  She is uncertain whether she had complete loss of strength in the upper face.  She had no other major symptoms over the next decade.   However, last year she had numbness in the left arm over a couple weeks The 2006 MRI showed pituitary enlargement.   Prolactin levels were recently elevated and she had a follow-up MRI showing stable pituitary enlargement and a pars intermedius cyst.   On the 09/23/2016 MRI, she had multiple white matter lesions, periventricular > juxtacortical, a left midbrain and right pons lesion.  Brain lesions were not present on the previous MRI.    She has been on Tecfidera or DMF since 2018.      IMAGING The MRI of the brain in October 30, 2004 did not show any brain lesions.   However, the second MRI performed 11/07/2004 appeared to show diffusion weighted hyperintensity in the right optic nerve as well as enhancement of the right optic  nervenot present on the original 10/30/2004 MRI.       The MRI from 09/23/2016 shows multiple T2/FLAIR hyperintense foci in the hemispheres, predominantly in the periventricular white matter but also in the juxtacortical white matter. Additionally there are left midbrain and right pontine lesions.   There is questionable optic nerve enhancement.    MRI brain 06/16/2020 showed T2/FLAIR hyperintense foci, predominantly in the periventricular and juxtacortical white matter in a pattern configuration consistent with chronic demyelinating plaque associated with multiple sclerosis.  None of the foci appear to be acute.  They do not enhance.  Compared to the MRI dated 09/23/2016, there are no new lesions..   No acute findings.  Normal enhancement pattern.   MRI cervical spine 06/16/2020 T2 hyperintense foci at the cervicomedullary junction and within the spinal cord adjacent to C2-C3 and adjacent to C4-C5.  These are consistent with chronic demyelination associated with MS.  The focus at C2-C3 was not present on the 2019 MRI and likely represents interval development of a chronic demyelinating plaque.      Degenerative changes at C4-C5 have progressed and there is now mild to moderate spinal stenosis due to a small central disc herniation.  There is no nerve root compression   REVIEW OF SYSTEMS: Out of a complete 14 system review of symptoms, the patient complains only of the following symptoms, eye itching, eye red constipation, daytime sleepiness, back pain, muscle cramps, neck pain, neck stiffness and all other reviewed systems are negative.   ALLERGIES: Allergies  Allergen Reactions   Latex Hives   Valsartan Itching    HOME MEDICATIONS: Outpatient Medications Prior to Visit  Medication Sig Dispense Refill   Armodafinil 250 MG tablet Take 1/2 to 1 tablet (125-250 mg total) by mouth every  morning. 90 tablet 1   baclofen (LIORESAL) 10 MG tablet First week or so, take 1/2 tablet by mouth 3 times a  day, then take 1 tablet by mouth 3 times a day 270 each 1   Cholecalciferol (VITAMIN D3) 5000 units TABS Take 1 tablet by mouth daily.     Dimethyl Fumarate 240 MG CPDR Take 1 capsule by mouth twice daily 60 capsule 11   ferrous sulfate 325 (65 FE) MG EC tablet Take 325 mg by mouth 3 (three) times daily with meals.     hydrochlorothiazide (MICROZIDE) 12.5 MG capsule One tab po qd 90 capsule 1   metoprolol tartrate (LOPRESSOR) 25 MG tablet TAKE 1 TABLET(25 MG) BY MOUTH TWICE DAILY 180 tablet 1   Prenatal Multivit-Min-Fe-FA (PRENATAL VITAMINS PO) Take 1 tablet by mouth daily.      gabapentin (NEURONTIN) 300 MG capsule Take 1 capsule (300 mg total) by mouth 3 (three) times daily. 90 capsule 11   No facility-administered medications prior to visit.    PAST MEDICAL HISTORY: Past Medical History:  Diagnosis Date   Hypertension    Multiple sclerosis ([place])    Neuromuscular disorder ([place])    Ovarian cyst, left 12/19/2016   Vision abnormalities     PAST SURGICAL HISTORY: Past Surgical History:  Procedure Laterality Date   LAPAROSCOPIC OVARIAN CYSTECTOMY Left 12/19/2016   Procedure: LAPAROSCOPIC LEFT OVARIAN CYSTECTOMY LYSIS OF ADHESIONS;  Surgeon: Janyth Contes, MD;  Location: [facility];  Service: Gynecology;  Laterality: Left;   WISDOM TOOTH EXTRACTION      FAMILY HISTORY: Family History  Problem Relation Age of Onset   Diabetes Mother    Hypertension Mother    Healthy Father    Diabetes Maternal Grandmother    Hypertension Maternal Grandmother    Liver cancer Maternal Grandfather     SOCIAL HISTORY: Social History   Socioeconomic History   Marital status: Married    Spouse name: Not on file   Number of children: Not on file   Years of education: Not on file   Highest education level: Not on file  Occupational History   Not on file  Tobacco Use   Smoking status: Never   Smokeless tobacco: Never  Vaping Use   Vaping Use: Never used  Substance and Sexual Activity    Alcohol use: No   Drug use: No   Sexual activity: Not on file  Other Topics Concern   Not on file  Social History Narrative   Not on file   Social Determinants of Health   Financial Resource Strain: Not on file  Food Insecurity: Not on file  Transportation Needs: Not on file  Physical Activity: Not on file  Stress: Not on file  Social Connections: Not on file  Intimate Partner Violence: Not on file     PHYSICAL EXAM  Vitals:   08/18/22 1445  BP: (!) 141/96  Pulse: 82  Weight: 294 lb (133.4 kg)  Height: '5\' 4"'$  (1.626 m)    Body mass index is 50.46 kg/m.  Generalized: Well developed, in no acute distress  Cardiology: normal rate and rhythm, no murmur noted Neurological examination  Mentation: Alert oriented to time, place, history taking. Follows all commands speech and language fluent Cranial nerve II-XII: Pupils were equal round reactive to light. Extraocular movements were full, visual field were full on confrontational test. Facial sensation and strength were normal. Uvula tongue midline. Head turning and shoulder shrug  were normal and symmetric. Motor: The motor testing reveals  5 over 5 strength of all 4 extremities. Good symmetric motor tone is noted throughout.  Sensory: Sensory testing is intact to soft touch on all 4 extremities. No evidence of extinction is noted.  Coordination: Cerebellar testing reveals good finger-nose-finger and heel-to-shin bilaterally.  Gait and station: Gait is normal.  Reflexes: Deep tendon reflexes are symmetric and normal bilaterally.    DIAGNOSTIC DATA (LABS, IMAGING, TESTING) - I reviewed patient records, labs, notes, testing and imaging myself where available.      No data to display           Lab Results  Component Value Date   WBC 5.1 02/10/2022   HGB 10.9 (L) 02/10/2022   HCT 34.4 02/10/2022   MCV 78 (L) 02/10/2022   PLT 371 02/10/2022      Component Value Date/Time   NA 136 07/20/2022 1637   K 3.8 07/20/2022  1637   CL 97 07/20/2022 1637   CO2 26 07/20/2022 1637   GLUCOSE 98 07/20/2022 1637   GLUCOSE 93 12/13/2016 1055   BUN 10 07/20/2022 1637   CREATININE 0.74 07/20/2022 1637   CALCIUM 9.8 07/20/2022 1637   PROT 7.6 07/20/2022 1637   ALBUMIN 4.3 07/20/2022 1637   AST 12 07/20/2022 1637   ALT 15 07/20/2022 1637   ALKPHOS 96 07/20/2022 1637   BILITOT <0.2 07/20/2022 1637   GFRNONAA 74 07/23/2020 1427   GFRAA 85 07/23/2020 1427   Lab Results  Component Value Date   CHOL 211 (H) 07/20/2022   HDL 46 07/20/2022   LDLCALC 146 (H) 07/20/2022   TRIG 105 07/20/2022   CHOLHDL 4.6 (H) 07/20/2022   Lab Results  Component Value Date   HGBA1C 5.6 06/14/2021   No results found for: "VITAMINB12" Lab Results  Component Value Date   TSH 2.320 06/14/2021    ASSESSMENT AND PLAN 37 y.o. year old female  has a past medical history of Hypertension, Multiple sclerosis ([place]), Neuromuscular disorder ([place]), Ovarian cyst, left (12/19/2016), and Vision abnormalities. here with     ICD-10-CM   1. Multiple sclerosis ([place])  G35 CBC with Differential/Platelets    MR BRAIN W WO CONTRAST    MR CERVICAL SPINE W WO CONTRAST    2. High risk medication use  Z79.899     3. Chronic fatigue  R53.82     4. Shift work sleep disorder  G47.26     5. Muscle tightness  M62.89     6. Urinary urgency  R39.15        Cheril is doing well. She will continue dimethyl fumerate. We will update CBC w diff today. I will order MRI brain and cervical spine for surveillance. Chem reviewed in EPIC from 07/2022.  We will continue armodafinil, gabapentin and baclofen as needed. Monitor urinary urgency. Could consider Kegel exercises. May also consider medication management if it worsens.  Healthy lifestyle habits encouraged. She will follow up with Dr Felecia Shelling in 6 months    Orders Placed This Encounter  Procedures   MR BRAIN W WO CONTRAST    Standing Status:   Future    Standing Expiration Date:   08/19/2023    Order  Specific Question:   If indicated for the ordered procedure, I authorize the administration of contrast media per Radiology protocol    Answer:   Yes    Order Specific Question:   What is the patient's sedation requirement?    Answer:   No Sedation    Order Specific Question:  Does the patient have a pacemaker or implanted devices?    Answer:   No    Order Specific Question:   Preferred imaging location?    Answer:   Internal   MR CERVICAL SPINE W WO CONTRAST    Standing Status:   Future    Standing Expiration Date:   08/19/2023    Order Specific Question:   If indicated for the ordered procedure, I authorize the administration of contrast media per Radiology protocol    Answer:   Yes    Order Specific Question:   What is the patient's sedation requirement?    Answer:   No Sedation    Order Specific Question:   Does the patient have a pacemaker or implanted devices?    Answer:   No    Order Specific Question:   Preferred imaging location?    Answer:   Internal   CBC with Differential/Platelets     Meds ordered this encounter  Medications   gabapentin (NEURONTIN) 300 MG capsule    Sig: Take 1 capsule (300 mg total) by mouth 3 (three) times daily.    Dispense:  90 capsule    Refill:  11    Order Specific Question:   Supervising Provider    Answer:   Melvenia Beam [[id number]]        YQI HKVQQ, FNP-C 08/18/2022, 3:22 PM [facility] [address], [place], [place] [address] [phone]

## 2022-08-18 ENCOUNTER — Encounter: Payer: Self-pay | Admitting: Family Medicine

## 2022-08-18 ENCOUNTER — Telehealth: Payer: Self-pay | Admitting: Family Medicine

## 2022-08-18 ENCOUNTER — Ambulatory Visit (INDEPENDENT_AMBULATORY_CARE_PROVIDER_SITE_OTHER): Payer: No Typology Code available for payment source | Admitting: Family Medicine

## 2022-08-18 VITALS — BP 141/96 | HR 82 | Ht 64.0 in | Wt 294.0 lb

## 2022-08-18 DIAGNOSIS — Z79899 Other long term (current) drug therapy: Secondary | ICD-10-CM | POA: Diagnosis not present

## 2022-08-18 DIAGNOSIS — R5382 Chronic fatigue, unspecified: Secondary | ICD-10-CM | POA: Diagnosis not present

## 2022-08-18 DIAGNOSIS — G35D Multiple sclerosis, unspecified: Secondary | ICD-10-CM

## 2022-08-18 DIAGNOSIS — M6289 Other specified disorders of muscle: Secondary | ICD-10-CM

## 2022-08-18 DIAGNOSIS — G35 Multiple sclerosis: Secondary | ICD-10-CM

## 2022-08-18 DIAGNOSIS — G4726 Circadian rhythm sleep disorder, shift work type: Secondary | ICD-10-CM

## 2022-08-18 DIAGNOSIS — R3915 Urgency of urination: Secondary | ICD-10-CM

## 2022-08-18 MED ORDER — GABAPENTIN 300 MG PO CAPS: 300.0000 mg | ORAL_CAPSULE | Freq: Three times a day (TID) | ORAL | 11 refills | Status: DC

## 2022-08-18 NOTE — Telephone Encounter (Signed)
[employer] sent to GI they obtain auth [id number]

## 2022-08-19 LAB — CBC WITH DIFFERENTIAL/PLATELET
Basophils Absolute: 0 10*3/uL (ref 0.0–0.2)
Basos: 1 %
EOS (ABSOLUTE): 0.2 10*3/uL (ref 0.0–0.4)
Eos: 3 %
Hematocrit: 37.5 % (ref 34.0–46.6)
Hemoglobin: 11.8 g/dL (ref 11.1–15.9)
Immature Grans (Abs): 0 10*3/uL (ref 0.0–0.1)
Immature Granulocytes: 0 %
Lymphocytes Absolute: 1.9 10*3/uL (ref 0.7–3.1)
Lymphs: 32 %
MCH: 25.7 pg — ABNORMAL LOW (ref 26.6–33.0)
MCHC: 31.5 g/dL (ref 31.5–35.7)
MCV: 82 fL (ref 79–97)
Monocytes Absolute: 0.6 10*3/uL (ref 0.1–0.9)
Monocytes: 9 %
Neutrophils Absolute: 3.4 10*3/uL (ref 1.4–7.0)
Neutrophils: 55 %
Platelets: 377 10*3/uL (ref 150–450)
RBC: 4.59 x10E6/uL (ref 3.77–5.28)
RDW: 14.3 % (ref 11.7–15.4)
WBC: 6.1 10*3/uL (ref 3.4–10.8)

## 2022-09-06 ENCOUNTER — Ambulatory Visit
Admission: RE | Admit: 2022-09-06 | Discharge: 2022-09-06 | Disposition: A | Discharge status: Home / Self Care | Payer: No Typology Code available for payment source | Source: Ambulatory Visit | Attending: Family Medicine | Admitting: Family Medicine

## 2022-09-06 DIAGNOSIS — G35 Multiple sclerosis: Secondary | ICD-10-CM

## 2022-09-06 MED ORDER — GADOPICLENOL 0.5 MMOL/ML IV SOLN
10.0000 mL | Freq: Once | INTRAVENOUS | Status: AC | PRN
  Administered 2022-09-06: 10 mL via INTRAVENOUS

## 2022-11-08 ENCOUNTER — Other Ambulatory Visit: Payer: Self-pay | Admitting: Neurology

## 2022-11-09 ENCOUNTER — Other Ambulatory Visit (HOSPITAL_COMMUNITY): Payer: Self-pay

## 2022-11-09 MED ORDER — ARMODAFINIL 250 MG PO TABS
125.0000 mg | ORAL_TABLET | Freq: Every morning | ORAL | 1 refills | Status: DC
  Filled 2022-11-09: qty 90, 90d supply, fill #0

## 2022-11-09 NOTE — Telephone Encounter (Signed)
Last seen on 02/10/22 per note "She has fatigue, better with armodafinil. " Follow up scheduled on 02/23/23 Last filled on 02/15/22 # 30 tablets  Rx pending to be signed

## 2022-11-10 ENCOUNTER — Other Ambulatory Visit (HOSPITAL_COMMUNITY): Payer: Self-pay

## 2022-11-10 ENCOUNTER — Other Ambulatory Visit: Payer: Self-pay

## 2022-12-12 HISTORY — PX: BARIATRIC SURGERY: SHX1103

## 2022-12-15 ENCOUNTER — Encounter: Payer: Self-pay | Admitting: Neurology

## 2023-01-01 NOTE — Progress Notes (Unsigned)
[facility]  PATIENT: Courtney Meza DOB: Dec 08, 1985  REFERRING DOCTOR OR PCP:  Gyn (referred by) is Dr. Ellyn Hack;  PCP is Dorothyann Peng SOURCE: Patient, notes from Dr. Ellyn Hack, MRI and lab reports, MRI images 3 onPACS.  _________________________________   HISTORICAL  CHIEF COMPLAINT:  No chief complaint on file.   HISTORY OF PRESENT ILLNESS:  Courtney Meza is a 37 y.o. woman with RRMS diagnosed early 2018.      Update 01/02/2023:: She was on dimethyl fumarate and tolerated it okay with occasional GI symptoms..  Due to gastric sleeve surgery her surgeon recommended that she change to a different DMT..  Additionally, MRI on 09/06/2022 showed a new lesion in the medulla not present on previous MRIs..  She is walking well.  Strength is fine.  No spasticity.   She usually uses the bannister but does not need to.    Rare tingling in neck but nothing bad.   No numbness.   Bladder is fine.    She notes vision is fine with her contacts.  No difficulty with colors.    She has fatigue, better with armodafinil.   She finds she sleeps extra on days she does not work.   She sleep 7-8 hours a night but 12 hours a 24 hour day   She takes a nap many days (works 7 pm to 7 am 3 days a week) on        Mood is fine.  Cognition is fine.     Neck pain is better..    Baclofen and gabapentin have helped these symptoms.        She is sleepy during the day at times.   Her husband has not noted that she snores..     EPWORTH SLEEPINESS SCALE  On a scale of 0 - 3 what is the chance of dozing:  Sitting and Reading:   2 Watching TV:    3 Sitting inactive in a public place: 0 Passenger in car for one hour: 3 Lying down to rest in the afternoon: 3 Sitting and talking to someone: 0 Sitting quietly after lunch:  2 In a car, stopped in traffic:  0  Total (out of 24):    13/24  (mild EDS).       MS History:   In April 2006, while driving home she had the sudden onset of reduced  right vision like she was looking through a heavy fog.     Vision improved over the next year but not to baseline and has been stable since..   Later in 2006, she had the onset of right facial weakness.  She had two weeks of facial twitching and then had facial weakness that seemed to develop over minutes to one hour.  She was told she had Bell's palsy.  She is uncertain whether she had complete loss of strength in the upper face.  She had no other major symptoms over the next decade.   However, last year she had numbness in the left arm over a couple weeks The 2006 MRI showed pituitary enlargement.   Prolactin levels were recently elevated and she had a follow-up MRI showing stable pituitary enlargement and a pars intermedius cyst.   On the 09/23/2016 MRI, she had multiple white matter lesions, periventricular > juxtacortical, a left midbrain and right pons lesion.  Brain lesions were not present on the previous MRI.    She has been on Tecfidera or DMF since 2018.   MRI  06/16/2020 showed 1 focus in the spinal cord not clearly present in 2019.  She opted to remain on Tecfidera.  MRI of the brain 09/06/2022 showed a new lesion in the medulla.  IMAGING The MRI of the brain in October 30, 2004 did not show any brain lesions.   However, the second MRI performed 11/07/2004 appeared to show diffusion weighted hyperintensity in the right optic nerve as well as enhancement of the right optic nervenot present on the original 10/30/2004 MRI.      The MRI from 09/23/2016 shows multiple T2/FLAIR hyperintense foci in the hemispheres, predominantly in the periventricular white matter but also in the juxtacortical white matter. Additionally there are left midbrain and right pontine lesions.   There is questionable optic nerve enhancement.   MRI brain 06/16/2020 showed T2/FLAIR hyperintense foci, predominantly in the periventricular and juxtacortical white matter in a pattern configuration consistent with chronic demyelinating  plaque associated with multiple sclerosis.  None of the foci appear to be acute.  They do not enhance.  Compared to the MRI dated 09/23/2016, there are no new lesions..   No acute findings.  Normal enhancement pattern.  MRI cervical spine 06/16/2020 T2 hyperintense foci at the cervicomedullary junction and within the spinal cord adjacent to C2-C3 and adjacent to C4-C5.  These are consistent with chronic demyelination associated with MS.  The focus at C2-C3 was not present on the 2019 MRI and likely represents interval development of a chronic demyelinating plaque.      Degenerative changes at C4-C5 have progressed and there is now mild to moderate spinal stenosis due to a small central disc herniation.  There is no nerve root compression  MRI of the brain 09/06/2022 showed T2/FLAIR hyperintense foci in the cerebral hemispheres predominantly in the periventricular white matter and an additional focus in the posterior right medulla. Foci do not enhance and are consistent with chronic demyelinating plaque associated with multiple sclerosis. The medullary focus was not clearly present on previous MRI and could represent interval demyelination   MRI of the cervical spine 09/06/2022 showed T2 hyperintense foci within the right posterior medulla and adjacent to C2-C3 and C4-C5 in the spinal cord.  The spinal cord foci are unchanged compared to the 2021 MRI while the focus in the medulla has occurred since the 2021 MRI.  Disc protrusion at C4-C5 causing mild spinal stenosis.  The size of the disc protrusion has improved compared to the 2021 MRI.  There is no nerve root compression.  REVIEW OF SYSTEMS: Constitutional: No fevers, chills, sweats, or change in appetite.   She notes some fatigue. She sometimes has trouble with sleep. Eyes: as above Ear, nose and throat: No hearing loss, ear pain, nasal congestion, sore throat Cardiovascular: No chest pain, palpitations Respiratory:  No shortness of breath at rest or with  exertion.   No wheezes GastrointestinaI: No nausea, vomiting, diarrhea, abdominal pain, fecal incontinence Genitourinary:  No dysuria, urinary retention or frequency.  No nocturia. Musculoskeletal:  No neck pain, back pain Integumentary: No rash, pruritus, skin lesions Neurological: as above Psychiatric: No depression at this time.  No anxiety Endocrine: No palpitations, diaphoresis, change in appetite, change in weigh or increased thirst Hematologic/Lymphatic:  No anemia, purpura, petechiae. Allergic/Immunologic: No itchy/runny eyes, nasal congestion, recent allergic reactions, rashes  ALLERGIES: Allergies  Allergen Reactions   Latex Hives   Valsartan Itching    HOME MEDICATIONS:  Current Outpatient Medications:    Armodafinil 250 MG tablet, Take 1/2 to 1 tablet (125-250 mg  total) by mouth every morning., Disp: 90 tablet, Rfl: 1   baclofen (LIORESAL) 10 MG tablet, First week or so, take 1/2 tablet by mouth 3 times a day, then take 1 tablet by mouth 3 times a day, Disp: 270 each, Rfl: 1   Cholecalciferol (VITAMIN D3) 5000 units TABS, Take 1 tablet by mouth daily., Disp: , Rfl:    Dimethyl Fumarate 240 MG CPDR, Take 1 capsule by mouth twice daily, Disp: 60 capsule, Rfl: 11   ferrous sulfate 325 (65 FE) MG EC tablet, Take 325 mg by mouth 3 (three) times daily with meals., Disp: , Rfl:    gabapentin (NEURONTIN) 300 MG capsule, Take 1 capsule (300 mg total) by mouth 3 (three) times daily., Disp: 90 capsule, Rfl: 11   hydrochlorothiazide (MICROZIDE) 12.5 MG capsule, One tab po qd, Disp: 90 capsule, Rfl: 1   metoprolol tartrate (LOPRESSOR) 25 MG tablet, TAKE 1 TABLET(25 MG) BY MOUTH TWICE DAILY, Disp: 180 tablet, Rfl: 1   Prenatal Multivit-Min-Fe-FA (PRENATAL VITAMINS PO), Take 1 tablet by mouth daily. , Disp: , Rfl:   PAST MEDICAL HISTORY: Past Medical History:  Diagnosis Date   Hypertension    Multiple sclerosis (HCC)    Neuromuscular disorder (HCC)    Ovarian cyst, left 12/19/2016    Vision abnormalities     PAST SURGICAL HISTORY: Past Surgical History:  Procedure Laterality Date   LAPAROSCOPIC OVARIAN CYSTECTOMY Left 12/19/2016   Procedure: LAPAROSCOPIC LEFT OVARIAN CYSTECTOMY LYSIS OF ADHESIONS;  Surgeon: Sherian Rein, MD;  Location: [facility];  Service: Gynecology;  Laterality: Left;   WISDOM TOOTH EXTRACTION      FAMILY HISTORY: Family History  Problem Relation Age of Onset   Diabetes Mother    Hypertension Mother    Healthy Father    Diabetes Maternal Grandmother    Hypertension Maternal Grandmother    Liver cancer Maternal Grandfather     SOCIAL HISTORY:  Social History   Socioeconomic History   Marital status: Married    Spouse name: Not on file   Number of children: Not on file   Years of education: Not on file   Highest education level: Not on file  Occupational History   Not on file  Tobacco Use   Smoking status: Never   Smokeless tobacco: Never  Vaping Use   Vaping Use: Never used  Substance and Sexual Activity   Alcohol use: No   Drug use: No   Sexual activity: Not on file  Other Topics Concern   Not on file  Social History Narrative   Not on file   Social Determinants of Health   Financial Resource Strain: Not on file  Food Insecurity: Not on file  Transportation Needs: Not on file  Physical Activity: Not on file  Stress: Not on file  Social Connections: Not on file  Intimate Partner Violence: Not on file     PHYSICAL EXAM  There were no vitals filed for this visit.   There is no height or weight on file to calculate BMI.  Neck circ:  16 inches  General: The patient is well-developed and well-nourished and in no acute distress.  Pharynx is Mallampate 0.    Neurologic Exam  Mental status: The patient is alert and oriented x 3 at the time of the examination. The patient has apparent normal recent and remote memory, with an apparently normal attention span and concentration ability.   Speech is  normal.  Cranial nerves: Extraocular movements are full.  Color vision  is now symmetric.    Colors are symmetric. Facial strength and sensation is normal.  Trapezius strength is normal.     No obvious hearing deficits are noted.  Motor:  Muscle bulk is normal.   Tone is normal. Strength is  5 / 5 in all 4 extremities.   Sensory: She has normal sensation to touch and vibration in the arms and legs.  Coordination: Cerebellar testing shows good finger-nose-finger bilaterally.  Gait and station: Station is normal.   Her gait is normal.  The tandem gait is mildly  wide. Romberg is negative.  Reflexes: Deep tendon reflexes are increased on the left...      ASSESSMENT AND PLAN  No diagnosis found.   1.    Continue DMF.  I will check a CBC with differential and LFT today.  Check MRI of the brain around time of next visit  to determine if there is subclinical activity.   2.    Continue Nuvigil for the shift work disorder/EDS/OSA.    3.    Continue vitamin D supplementation.  4.    gabapentin prn dysesthesias and baclofen.   5.   Referral to weight loss clinic.  She would prefer to go to [facility]   6.   She will return to see me in 5-6 months or sooner if there are new or worsening neurologic symptoms.     Arthur Aydelotte A. Epimenio Foot, MD, PhD 01/01/2023, 5:14 PM Certified in Neurology, Clinical Neurophysiology, Sleep Medicine, Pain Medicine and Neuroimaging  [facility] [address] [place], [place] [address] [phone]

## 2023-01-02 ENCOUNTER — Encounter: Payer: Self-pay | Admitting: Neurology

## 2023-01-02 ENCOUNTER — Ambulatory Visit (INDEPENDENT_AMBULATORY_CARE_PROVIDER_SITE_OTHER): Payer: No Typology Code available for payment source | Admitting: Neurology

## 2023-01-02 VITALS — BP 144/93 | HR 75 | Ht 64.0 in | Wt 272.0 lb

## 2023-01-02 DIAGNOSIS — Z79899 Other long term (current) drug therapy: Secondary | ICD-10-CM

## 2023-01-02 DIAGNOSIS — R269 Unspecified abnormalities of gait and mobility: Secondary | ICD-10-CM

## 2023-01-02 DIAGNOSIS — G4726 Circadian rhythm sleep disorder, shift work type: Secondary | ICD-10-CM

## 2023-01-02 DIAGNOSIS — G35D Multiple sclerosis, unspecified: Secondary | ICD-10-CM

## 2023-01-02 DIAGNOSIS — R3915 Urgency of urination: Secondary | ICD-10-CM

## 2023-01-02 DIAGNOSIS — G35 Multiple sclerosis: Secondary | ICD-10-CM | POA: Diagnosis not present

## 2023-01-02 MED ORDER — ARMODAFINIL 250 MG PO TABS: 125.0000 mg | ORAL_TABLET | Freq: Every morning | ORAL | 1 refills | Status: DC

## 2023-01-02 MED ORDER — OXYBUTYNIN CHLORIDE ER 10 MG PO TB24
10.0000 mg | ORAL_TABLET | Freq: Every day | ORAL | 3 refills | Status: DC
Start: 2023-01-02 — End: 2024-01-25

## 2023-01-03 LAB — CBC WITH DIFFERENTIAL/PLATELET
EOS (ABSOLUTE): 0.1 10*3/uL (ref 0.0–0.4)
Eos: 4 %
Immature Grans (Abs): 0 10*3/uL (ref 0.0–0.1)
Lymphs: 30 %
MCH: 26.8 pg (ref 26.6–33.0)
Monocytes: 7 %
Neutrophils: 58 %
WBC: 3.8 10*3/uL (ref 3.4–10.8)

## 2023-01-03 LAB — COMPREHENSIVE METABOLIC PANEL
Albumin/Globulin Ratio: 1.3 (ref 1.2–2.2)
BUN/Creatinine Ratio: 13 (ref 9–23)
Glucose: 95 mg/dL (ref 70–99)

## 2023-01-03 LAB — HIV ANTIBODY (ROUTINE TESTING W REFLEX): HIV Screen 4th Generation wRfx: NONREACTIVE

## 2023-01-03 LAB — QUANTIFERON-TB GOLD PLUS

## 2023-01-04 LAB — COMPREHENSIVE METABOLIC PANEL
AST: 12 IU/L (ref 0–40)
Albumin: 3.9 g/dL (ref 3.9–4.9)
Alkaline Phosphatase: 100 IU/L (ref 44–121)
BUN: 9 mg/dL (ref 6–20)
Calcium: 9.3 mg/dL (ref 8.7–10.2)
Creatinine, Ser: 0.7 mg/dL (ref 0.57–1.00)
Sodium: 141 mmol/L (ref 134–144)
Total Protein: 6.9 g/dL (ref 6.0–8.5)

## 2023-01-04 LAB — QUANTIFERON-TB GOLD PLUS

## 2023-01-05 ENCOUNTER — Telehealth: Payer: Self-pay | Admitting: *Deleted

## 2023-01-05 LAB — HEPATITIS B SURFACE ANTIBODY,QUALITATIVE: Hep B Surface Ab, Qual: NONREACTIVE

## 2023-01-05 LAB — COMPREHENSIVE METABOLIC PANEL
ALT: 12 IU/L (ref 0–32)
Bilirubin Total: 0.3 mg/dL (ref 0.0–1.2)
CO2: 26 mmol/L (ref 20–29)
Chloride: 103 mmol/L (ref 96–106)
Globulin, Total: 3 g/dL (ref 1.5–4.5)
Potassium: 3.9 mmol/L (ref 3.5–5.2)
eGFR: 113 mL/min/{1.73_m2} (ref >59)

## 2023-01-05 LAB — CBC WITH DIFFERENTIAL/PLATELET
Basophils Absolute: 0 10*3/uL (ref 0.0–0.2)
Basos: 1 %
Hematocrit: 37.3 % (ref 34.0–46.6)
Hemoglobin: 12.1 g/dL (ref 11.1–15.9)
Immature Granulocytes: 0 %
Lymphocytes Absolute: 1.2 10*3/uL (ref 0.7–3.1)
MCHC: 32.4 g/dL (ref 31.5–35.7)
MCV: 83 fL (ref 79–97)
Monocytes Absolute: 0.3 10*3/uL (ref 0.1–0.9)
Neutrophils Absolute: 2.2 10*3/uL (ref 1.4–7.0)
Platelets: 377 10*3/uL (ref 150–450)
RBC: 4.51 x10E6/uL (ref 3.77–5.28)
RDW: 13.5 % (ref 11.7–15.4)

## 2023-01-05 LAB — QUANTIFERON-TB GOLD PLUS
QuantiFERON Mitogen Value: 1.94 IU/mL
QuantiFERON TB1 Ag Value: 0.02 IU/mL
QuantiFERON-TB Gold Plus: NEGATIVE

## 2023-01-05 LAB — VARICELLA ZOSTER ANTIBODY, IGG: Varicella zoster IgG: 3552 index (ref >165)

## 2023-01-05 LAB — HEPATITIS B CORE ANTIBODY, TOTAL: Hep B Core Total Ab: NEGATIVE

## 2023-01-05 LAB — HEPATITIS B SURFACE ANTIGEN: Hepatitis B Surface Ag: NEGATIVE

## 2023-01-05 NOTE — Telephone Encounter (Signed)
LVM for pt to call office

## 2023-01-05 NOTE — Telephone Encounter (Signed)
-----   Message from Asa Lente, MD sent at 01/05/2023  8:27 AM EDT ----- Her labs are fine.  She can start Briumvi or Ocrevus.  Either would be fine with me if the insurance company has a preference.  Otherwise, Briumvi may be better tolerated.

## 2023-01-09 ENCOUNTER — Other Ambulatory Visit: Payer: Self-pay | Admitting: *Deleted

## 2023-01-09 ENCOUNTER — Telehealth: Payer: Self-pay

## 2023-01-09 DIAGNOSIS — G35 Multiple sclerosis: Secondary | ICD-10-CM

## 2023-01-09 DIAGNOSIS — Z79899 Other long term (current) drug therapy: Secondary | ICD-10-CM

## 2023-01-09 DIAGNOSIS — G4726 Circadian rhythm sleep disorder, shift work type: Secondary | ICD-10-CM

## 2023-01-09 NOTE — Telephone Encounter (Addendum)
     Fax sent 01/09/2023 to [facility] Patient Support [phone]

## 2023-01-10 ENCOUNTER — Other Ambulatory Visit (INDEPENDENT_AMBULATORY_CARE_PROVIDER_SITE_OTHER): Payer: Self-pay

## 2023-01-10 DIAGNOSIS — Z79899 Other long term (current) drug therapy: Secondary | ICD-10-CM

## 2023-01-10 DIAGNOSIS — Z0289 Encounter for other administrative examinations: Secondary | ICD-10-CM

## 2023-01-10 DIAGNOSIS — G35 Multiple sclerosis: Secondary | ICD-10-CM

## 2023-01-11 LAB — IGG, IGA, IGM
IgA/Immunoglobulin A, Serum: 187 mg/dL (ref 87–352)
IgG (Immunoglobin G), Serum: 1561 mg/dL (ref 586–1602)
IgM (Immunoglobulin M), Srm: 46 mg/dL (ref 26–217)

## 2023-01-26 ENCOUNTER — Ambulatory Visit
Admission: RE | Admit: 2023-01-26 | Discharge: 2023-01-26 | Disposition: A | Discharge status: Home / Self Care | Payer: No Typology Code available for payment source | Source: Ambulatory Visit | Attending: Internal Medicine | Admitting: Internal Medicine

## 2023-01-26 ENCOUNTER — Ambulatory Visit: Payer: No Typology Code available for payment source | Admitting: Internal Medicine

## 2023-01-26 ENCOUNTER — Encounter: Payer: Self-pay | Admitting: Internal Medicine

## 2023-01-26 VITALS — BP 126/82 | HR 85 | Temp 97.9°F | Ht 64.0 in | Wt 267.8 lb

## 2023-01-26 DIAGNOSIS — R0781 Pleurodynia: Secondary | ICD-10-CM

## 2023-01-26 DIAGNOSIS — I10 Essential (primary) hypertension: Secondary | ICD-10-CM | POA: Diagnosis not present

## 2023-01-26 DIAGNOSIS — Z0000 Encounter for general adult medical examination without abnormal findings: Secondary | ICD-10-CM | POA: Diagnosis not present

## 2023-01-26 DIAGNOSIS — Z6841 Body Mass Index (BMI) 40.0 and over, adult: Secondary | ICD-10-CM

## 2023-01-26 DIAGNOSIS — G35 Multiple sclerosis: Secondary | ICD-10-CM | POA: Diagnosis not present

## 2023-01-26 DIAGNOSIS — M545 Low back pain, unspecified: Secondary | ICD-10-CM

## 2023-01-26 LAB — POCT URINALYSIS DIPSTICK
Bilirubin, UA: NEGATIVE
Glucose, UA: NEGATIVE
Ketones, UA: NEGATIVE
Leukocytes, UA: NEGATIVE
Nitrite, UA: NEGATIVE
Protein, UA: POSITIVE — AB
Spec Grav, UA: >=1.03 — AB (ref 1.010–1.025)
Urobilinogen, UA: 0.2 E.U./dL
pH, UA: 6.5 (ref 5.0–8.0)

## 2023-01-26 MED ORDER — CYCLOBENZAPRINE HCL 5 MG PO TABS: 5.0000 mg | ORAL_TABLET | Freq: Three times a day (TID) | ORAL | 0 refills | Status: DC | PRN

## 2023-01-26 NOTE — Progress Notes (Signed)
Subjective:    Patient ID: Courtney Meza , female    DOB: 1985/11/06 , 37 y.o.   MRN: [id number]  Chief Complaint  Patient presents with   Annual Exam   Hypertension    HPI  Patient presents today for annual exam. She reports compliance with medications. Denies headache, chest pain, and SOB.  GYN: Naima Dillard.   She reports having gastric sleeve surgery last month. She admits no side effects since surgery up until she felt pain on Monday. She started to have left sided low back pain. Denies fall/trauma. She is back working since her surgery, she is an [employer] fundraiser. She denies doing any heavy lifting. She also has pain with breathing. Denies having any cp/sob.    Hypertension This is a chronic problem. The current episode started more than 1 year ago. The problem has been gradually improving since onset. The problem is uncontrolled. Pertinent negatives include no chest pain. Risk factors for coronary artery disease include obesity and sedentary lifestyle. Past treatments include beta blockers. The current treatment provides moderate improvement. Compliance problems include exercise.      Past Medical History:  Diagnosis Date   Hypertension    Multiple sclerosis (HCC)    Neuromuscular disorder (HCC)    Ovarian cyst, left 12/19/2016   Vision abnormalities      Family History  Problem Relation Age of Onset   Diabetes Mother    Hypertension Mother    Healthy Father    Diabetes Maternal Grandmother    Hypertension Maternal Grandmother    Liver cancer Maternal Grandfather      Current Outpatient Medications:    Armodafinil 250 MG tablet, Take 1/2 to 1 tablet (125-250 mg total) by mouth every morning., Disp: 90 tablet, Rfl: 1   baclofen (LIORESAL) 10 MG tablet, First week or so, take 1/2 tablet by mouth 3 times a day, then take 1 tablet by mouth 3 times a day, Disp: 270 each, Rfl: 1   Cholecalciferol (VITAMIN D3) 5000 units TABS, Take 1 tablet by mouth daily., Disp: , Rfl:     cyclobenzaprine (FLEXERIL) 5 MG tablet, Take 1 tablet (5 mg total) by mouth 3 (three) times daily as needed for muscle spasms., Disp: 30 tablet, Rfl: 0   gabapentin (NEURONTIN) 300 MG capsule, Take 1 capsule (300 mg total) by mouth 3 (three) times daily., Disp: 90 capsule, Rfl: 11   metoprolol tartrate (LOPRESSOR) 25 MG tablet, TAKE 1 TABLET(25 MG) BY MOUTH TWICE DAILY, Disp: 180 tablet, Rfl: 1   oxybutynin (DITROPAN XL) 10 MG 24 hr tablet, Take 1 tablet (10 mg total) by mouth at bedtime., Disp: 90 tablet, Rfl: 3   ferrous sulfate 325 (65 FE) MG EC tablet, Take 325 mg by mouth 3 (three) times daily with meals. (Patient not taking: Reported on 01/02/2023), Disp: , Rfl:    Allergies  Allergen Reactions   Latex Hives   Valsartan Itching      The patient states she uses none for birth control. Patient's last menstrual period was 01/17/2023 (exact date).. Negative for Dysmenorrhea. Negative for: breast discharge, breast lump(s), breast pain and breast self exam. Associated symptoms include abnormal vaginal bleeding. Pertinent negatives include abnormal bleeding (hematology), anxiety, decreased libido, depression, difficulty falling sleep, dyspareunia, history of infertility, nocturia, sexual dysfunction, sleep disturbances, urinary incontinence, urinary urgency, vaginal discharge and vaginal itching. Diet regular.The patient states her exercise level is  intermittent.   . The patient's tobacco use is:  Social History   Tobacco Use  Smoking Status Never  Smokeless Tobacco Never  . She has been exposed to passive smoke. The patient's alcohol use is:  Social History   Substance and Sexual Activity  Alcohol Use No    Review of Systems  Constitutional: Negative.   HENT: Negative.    Eyes: Negative.   Respiratory: Negative.    Cardiovascular: Negative.  Negative for chest pain.  Gastrointestinal: Negative.   Endocrine: Negative.   Genitourinary: Negative.   Musculoskeletal:  Positive for back  pain.       She c/o LBP, sx started on Monday.  It is on the left side.  Described as a sharp, spasm pain.  She has taken medication for gas- Gas-X and Tums without relief of her sx. She also tried Mg citrate to help clean her out.   Skin: Negative.   Allergic/Immunologic: Negative.   Neurological: Negative.   Hematological: Negative.   Psychiatric/Behavioral: Negative.       Today's Vitals   01/26/23 1403  BP: 126/82  Pulse: 85  Temp: 97.9 F (36.6 C)  SpO2: 98%  Weight: 267 lb 12.8 oz (121.5 kg)  Height: 5\' 4"  (1.626 m)   Body mass index is 45.97 kg/m.  Wt Readings from Last 3 Encounters:  01/26/23 267 lb 12.8 oz (121.5 kg)  01/02/23 272 lb (123.4 kg)  08/18/22 294 lb (133.4 kg)     Objective:  Physical Exam Vitals and nursing note reviewed.  Constitutional:      Appearance: Normal appearance. She is obese.  HENT:     Head: Normocephalic and atraumatic.     Right Ear: Tympanic membrane, ear canal and external ear normal.     Left Ear: Tympanic membrane, ear canal and external ear normal.     Nose: Nose normal.     Mouth/Throat:     Mouth: Mucous membranes are moist.     Pharynx: Oropharynx is clear.  Eyes:     Extraocular Movements: Extraocular movements intact.     Conjunctiva/sclera: Conjunctivae normal.     Pupils: Pupils are equal, round, and reactive to light.  Cardiovascular:     Rate and Rhythm: Normal rate and regular rhythm.     Pulses: Normal pulses.     Heart sounds: Normal heart sounds.  Pulmonary:     Effort: Pulmonary effort is normal.     Breath sounds: Normal breath sounds.  Chest:  Breasts:    Tanner Score is 5.     Right: Normal.     Left: Normal.  Abdominal:     General: Bowel sounds are normal.     Palpations: Abdomen is soft.     Comments: Soft, obese  Genitourinary:    Comments: deferred Musculoskeletal:        General: Normal range of motion.     Cervical back: Normal range of motion and neck supple.     Thoracic back:  Tenderness present.     Lumbar back: Tenderness present.       Back:     Comments: Tenderness under left rib cage, pain worsens with movement. Tenderness extends to lower back  Skin:    General: Skin is warm and dry.  Neurological:     General: No focal deficit present.     Mental Status: She is alert and oriented to person, place, and time.  Psychiatric:        Mood and Affect: Mood normal.        Behavior: Behavior normal.  Assessment And Plan:     1. Encounter for annual health examination Comments: A full exam was performed.  Importance of monthly self breast exams was discussed with the patient.  PATIENT IS ADVISED TO GET 30-45 MINUTES REGULAR EXERCISE NO LESS THAN FOUR TO FIVE DAYS PER WEEK - BOTH WEIGHTBEARING EXERCISES AND AEROBIC ARE RECOMMENDED.  PATIENT IS ADVISED TO FOLLOW A HEALTHY DIET WITH AT LEAST SIX FRUITS/VEGGIES PER DAY, DECREASE INTAKE OF RED MEAT, AND TO INCREASE FISH INTAKE TO TWO DAYS PER WEEK.  MEATS/FISH SHOULD NOT BE FRIED, BAKED OR BROILED IS PREFERABLE.  IT IS ALSO IMPORTANT TO CUT BACK ON YOUR SUGAR INTAKE. PLEASE AVOID ANYTHING WITH ADDED SUGAR, CORN SYRUP OR OTHER SWEETENERS. IF YOU MUST USE A SWEETENER, YOU CAN TRY STEVIA. IT IS ALSO IMPORTANT TO AVOID ARTIFICIALLY SWEETENERS AND DIET BEVERAGES. LASTLY, I SUGGEST WEARING SPF 50 SUNSCREEN ON EXPOSED PARTS AND ESPECIALLY WHEN IN THE DIRECT SUNLIGHT FOR AN EXTENDED PERIOD OF TIME.  PLEASE AVOID FAST FOOD RESTAURANTS AND INCREASE YOUR WATER INTAKE. - CMP14+EGFR - CBC - Lipid panel - Hemoglobin A1c - TSH  2. Essential hypertension, benign Comments: Chronic, well controlled. EKG performed, NSR w/ no changes  She will c/w metoprolol tartrate 25mg  twice daily. Advised to follow low sodium diet. F/u 4-6 mos. - POCT Urinalysis Dipstick (81002) - Microalbumin / Creatinine Urine Ratio - EKG 12-Lead  3. Acute left-sided low back pain without sciatica Comments: I will send rx cyclobenzaprine, 5mg  nightly to  use prn. She is encouraged to perform stretching exercises.  4. Pleuritic pain Comments: Due to recent surgery, I will check D-dimer today. I will also check CXR. She verbalizes understanding of treatment plan. - DG Chest 2 View; Future - D-dimer, quantitative (not at [facility])  5. Multiple sclerosis (HCC) Comments: Chronic, most recent Neuro note reviewed. She will continue dimethyl fumerate. She will c/w gabapentin, baclofen and armodafanil prn.  6. Class 3 severe obesity due to excess calories with serious comorbidity and body mass index (BMI) of 45.0 to 49.9 in adult (HCC) BMI 45. She is s/p gastric sleeve on 12/12/22.  She is encouraged to strive for BMI less than 30 to decrease cardiac risk. Advised to aim for at least 150 minutes of exercise per week.    Return for 1 year HM, 4 MONTH BPC. . Patient was given opportunity to ask questions. Patient verbalized understanding of the plan and was able to repeat key elements of the plan. All questions were answered to their satisfaction.   I, Gwynneth Aliment, MD, have reviewed all documentation for this visit. The documentation on 01/26/23 for the exam, diagnosis, procedures, and orders are all accurate and complete.

## 2023-01-26 NOTE — Patient Instructions (Signed)

## 2023-01-27 ENCOUNTER — Encounter: Payer: Self-pay | Admitting: Internal Medicine

## 2023-01-27 LAB — LIPID PANEL
Chol/HDL Ratio: 4.3 ratio (ref 0.0–4.4)
Cholesterol, Total: 180 mg/dL (ref 100–199)
HDL: 42 mg/dL (ref >39)
LDL Chol Calc (NIH): 124 mg/dL — ABNORMAL HIGH (ref 0–99)
Triglycerides: 77 mg/dL (ref 0–149)
VLDL Cholesterol Cal: 14 mg/dL (ref 5–40)

## 2023-01-27 LAB — CMP14+EGFR
ALT: 12 IU/L (ref 0–32)
AST: 12 IU/L (ref 0–40)
Albumin: 4.2 g/dL (ref 3.9–4.9)
Alkaline Phosphatase: 117 IU/L (ref 44–121)
BUN/Creatinine Ratio: 13 (ref 9–23)
BUN: 10 mg/dL (ref 6–20)
Bilirubin Total: 0.3 mg/dL (ref 0.0–1.2)
CO2: 25 mmol/L (ref 20–29)
Calcium: 9.2 mg/dL (ref 8.7–10.2)
Chloride: 101 mmol/L (ref 96–106)
Creatinine, Ser: 0.77 mg/dL (ref 0.57–1.00)
Globulin, Total: 3.1 g/dL (ref 1.5–4.5)
Glucose: 89 mg/dL (ref 70–99)
Potassium: 4.2 mmol/L (ref 3.5–5.2)
Sodium: 140 mmol/L (ref 134–144)
Total Protein: 7.3 g/dL (ref 6.0–8.5)
eGFR: 101 mL/min/{1.73_m2} (ref >59)

## 2023-01-27 LAB — MICROALBUMIN / CREATININE URINE RATIO
Creatinine, Urine: 242.3 mg/dL
Microalb/Creat Ratio: 11 mg/g creat (ref 0–29)
Microalbumin, Urine: 27.5 ug/mL

## 2023-01-27 LAB — D-DIMER, QUANTITATIVE: D-DIMER: 0.24 mg/L FEU (ref 0.00–0.49)

## 2023-01-27 LAB — CBC
Hematocrit: 36.6 % (ref 34.0–46.6)
Hemoglobin: 11.9 g/dL (ref 11.1–15.9)
MCH: 27.2 pg (ref 26.6–33.0)
MCHC: 32.5 g/dL (ref 31.5–35.7)
MCV: 84 fL (ref 79–97)
Platelets: 381 10*3/uL (ref 150–450)
RBC: 4.38 x10E6/uL (ref 3.77–5.28)
RDW: 13.3 % (ref 11.7–15.4)
WBC: 4.3 10*3/uL (ref 3.4–10.8)

## 2023-01-27 LAB — HEMOGLOBIN A1C
Est. average glucose Bld gHb Est-mCnc: 117 mg/dL
Hgb A1c MFr Bld: 5.7 % — ABNORMAL HIGH (ref 4.8–5.6)

## 2023-01-27 LAB — TSH: TSH: 2.15 u[IU]/mL (ref 0.450–4.500)

## 2023-02-03 DIAGNOSIS — M545 Low back pain, unspecified: Secondary | ICD-10-CM | POA: Insufficient documentation

## 2023-02-03 DIAGNOSIS — R0781 Pleurodynia: Secondary | ICD-10-CM | POA: Insufficient documentation

## 2023-02-14 ENCOUNTER — Other Ambulatory Visit: Payer: Self-pay | Admitting: Internal Medicine

## 2023-02-14 ENCOUNTER — Other Ambulatory Visit: Payer: Self-pay

## 2023-02-14 ENCOUNTER — Encounter: Payer: Self-pay | Admitting: Internal Medicine

## 2023-02-14 MED ORDER — LINACLOTIDE 145 MCG PO CAPS: 145.0000 ug | ORAL_CAPSULE | Freq: Every day | ORAL | 0 refills | Status: DC

## 2023-02-23 ENCOUNTER — Ambulatory Visit: Payer: Commercial Managed Care - PPO | Admitting: Neurology

## 2023-03-16 NOTE — Telephone Encounter (Signed)
Patient's first Briumvi infusion was 03/08/2023.

## 2023-05-29 ENCOUNTER — Ambulatory Visit (INDEPENDENT_AMBULATORY_CARE_PROVIDER_SITE_OTHER): Payer: No Typology Code available for payment source | Admitting: Internal Medicine

## 2023-05-29 ENCOUNTER — Encounter: Payer: Self-pay | Admitting: Internal Medicine

## 2023-05-29 VITALS — BP 140/88 | HR 76 | Temp 98.4°F | Ht 64.0 in | Wt 252.0 lb

## 2023-05-29 DIAGNOSIS — I10 Essential (primary) hypertension: Secondary | ICD-10-CM

## 2023-05-29 DIAGNOSIS — Z23 Encounter for immunization: Secondary | ICD-10-CM

## 2023-05-29 DIAGNOSIS — L821 Other seborrheic keratosis: Secondary | ICD-10-CM | POA: Insufficient documentation

## 2023-05-29 DIAGNOSIS — Z6841 Body Mass Index (BMI) 40.0 and over, adult: Secondary | ICD-10-CM | POA: Insufficient documentation

## 2023-05-29 MED ORDER — METOPROLOL TARTRATE 25 MG PO TABS
ORAL_TABLET | ORAL | 1 refills | Status: DC
Start: 2023-05-29 — End: 2023-10-02

## 2023-05-29 NOTE — Assessment & Plan Note (Signed)
Chronic, she was congratulated on her 20lb weight loss since June. She is s/p gastric sleeve procedure May 2024 at [facility]. She is encouraged to incorporate more strength training into her weekly exercise routine.

## 2023-05-29 NOTE — Progress Notes (Signed)
I,Jameka J Llittleton, CMA,acting as a Neurosurgeon for Gwynneth Aliment, MD.,have documented all relevant documentation on the behalf of Gwynneth Aliment, MD,as directed by  Gwynneth Aliment, MD while in the presence of Gwynneth Aliment, MD.  Subjective:  Patient ID: Courtney Meza , female    DOB: [birth year] , 37 y.o.   MRN: [id number]  Chief Complaint  Patient presents with   Hypertension    HPI  Patient here for a blood pressure f/u. She reports compliance with meds.  She denies headaches, chest pain and shortness of breath. Patient reports she feels like her bp has been running high but she hasn't always been able to check. Patient reports she is due for her pap with Dr.Dillard.       Hypertension This is a chronic problem. The current episode started more than 1 year ago. The problem has been waxing and waning since onset. The problem is controlled. Pertinent negatives include no anxiety. There are no associated agents to hypertension. Risk factors for coronary artery disease include obesity and sedentary lifestyle. Past treatments include diuretics and beta blockers. There are no compliance problems.  There is no history of angina. There is no history of chronic renal disease.     Past Medical History:  Diagnosis Date   Hypertension    Multiple sclerosis (HCC)    Neuromuscular disorder (HCC)    Ovarian cyst, left 12/19/2016   Vision abnormalities      Family History  Problem Relation Age of Onset   Diabetes Mother    Hypertension Mother    Healthy Father    Diabetes Maternal Grandmother    Hypertension Maternal Grandmother    Liver cancer Maternal Grandfather      Current Outpatient Medications:    Armodafinil 250 MG tablet, Take 1/2 to 1 tablet (125-250 mg total) by mouth every morning., Disp: 90 tablet, Rfl: 1   baclofen (LIORESAL) 10 MG tablet, First week or so, take 1/2 tablet by mouth 3 times a day, then take 1 tablet by mouth 3 times a day, Disp: 270 each, Rfl: 1    Cholecalciferol (VITAMIN D3) 5000 units TABS, Take 1 tablet by mouth daily., Disp: , Rfl:    gabapentin (NEURONTIN) 300 MG capsule, Take 1 capsule (300 mg total) by mouth 3 (three) times daily., Disp: 90 capsule, Rfl: 11   linaclotide (LINZESS) 145 MCG CAPS capsule, Take 1 capsule (145 mcg total) by mouth daily before breakfast., Disp: 90 capsule, Rfl: 0   Ublituximab-xiiy (BRIUMVI) 150 MG/6ML SOLN, Inject into the vein. yearly, Disp: , Rfl:    metoprolol tartrate (LOPRESSOR) 25 MG tablet, TAKE 1 TABLET(25 MG) BY MOUTH TWICE DAILY, Disp: 180 tablet, Rfl: 1   oxybutynin (DITROPAN XL) 10 MG 24 hr tablet, Take 1 tablet (10 mg total) by mouth at bedtime. (Patient not taking: Reported on 05/29/2023), Disp: 90 tablet, Rfl: 3   Allergies  Allergen Reactions   Latex Hives   Valsartan Itching     Review of Systems  Constitutional: Negative.   Respiratory: Negative.    Cardiovascular: Negative.   Neurological: Negative.   Psychiatric/Behavioral: Negative.       Today's Vitals   05/29/23 1455 05/29/23 1533  BP: (!) 140/82 (!) 140/88  Pulse: 76   Temp: 98.4 F (36.9 C)   Weight: 252 lb (114.3 kg)   Height: 5\' 4"  (1.626 m)    Body mass index is 43.26 kg/m.  Wt Readings from Last 3 Encounters:  05/29/23 252 lb (  114.3 kg)  01/26/23 267 lb 12.8 oz (121.5 kg)  01/02/23 272 lb (123.4 kg)    The ASCVD Risk score (Arnett DK, et al., 2019) failed to calculate for the following reasons:   The 2019 ASCVD risk score is only valid for ages 7 to 44  Objective:  Physical Exam Vitals and nursing note reviewed.  Constitutional:      Appearance: Normal appearance. She is obese.  HENT:     Head: Normocephalic and atraumatic.  Eyes:     Extraocular Movements: Extraocular movements intact.  Cardiovascular:     Rate and Rhythm: Normal rate and regular rhythm.     Heart sounds: Normal heart sounds.  Pulmonary:     Effort: Pulmonary effort is normal.     Breath sounds: Normal breath sounds.   Musculoskeletal:     Cervical back: Normal range of motion.  Skin:    General: Skin is warm.     Comments: Hyperpigmented, flat and pedunculated lesions at base of neck, anterior chest, face  Neurological:     General: No focal deficit present.     Mental Status: She is alert.  Psychiatric:        Mood and Affect: Mood normal.        Behavior: Behavior normal.         Assessment And Plan:  Essential hypertension, benign Assessment & Plan: Chronic, uncontrolled. I will increase dose of metoprolol tartrate to 25mg  twice daily. She agrees to rto in 3 weeks for NV. If controlled, she will see me in four months. If elevated, may consider use of amlodipine vs. ARB to address uncontrolled HTN.   Orders: -     Metoprolol Tartrate; TAKE 1 TABLET(25 MG) BY MOUTH TWICE DAILY  Dispense: 180 tablet; Refill: 1  Dermatosis papulosa nigra Assessment & Plan: I will refer her to Derm for further evaluation/excision. She is in agreement with treatment plan.   Orders: -     Ambulatory referral to Dermatology  Body mass index (BMI) of 40.1-44.9 in adult [facility]) Assessment & Plan: Chronic, she was congratulated on her 20lb weight loss since June. She is s/p gastric sleeve procedure May 2024 at [facility]. She is encouraged to incorporate more strength training into her weekly exercise routine.    Immunization due -     Flu vaccine trivalent PF, 6mos and older(Flulaval,Afluria,Fluarix,Fluzone)    Return in about 3 weeks (around 06/19/2023), or BP check - NV, for 4 month bp check.  Patient was given opportunity to ask questions. Patient verbalized understanding of the plan and was able to repeat key elements of the plan. All questions were answered to their satisfaction.    I, Gwynneth Aliment, MD, have reviewed all documentation for this visit. The documentation on 05/29/23 for the exam, diagnosis, procedures, and orders are all accurate and complete.   IF YOU HAVE BEEN REFERRED TO A  SPECIALIST, IT MAY TAKE 1-2 WEEKS TO SCHEDULE/PROCESS THE REFERRAL. IF YOU HAVE NOT HEARD FROM US/SPECIALIST IN TWO WEEKS, PLEASE GIVE [place] A CALL AT [phone] X [phone].

## 2023-05-29 NOTE — Assessment & Plan Note (Signed)
Chronic, uncontrolled. I will increase dose of metoprolol tartrate to 25mg  twice daily. She agrees to rto in 3 weeks for NV. If controlled, she will see me in four months. If elevated, may consider use of amlodipine vs. ARB to address uncontrolled HTN.

## 2023-05-29 NOTE — Patient Instructions (Signed)
Exercising to Stay Healthy To become healthy and stay healthy, it is recommended that you do moderate-intensity and vigorous-intensity exercise. You can tell that you are exercising at a moderate intensity if your heart starts beating faster and you start breathing faster but can still hold a conversation. You can tell that you are exercising at a vigorous intensity if you are breathing much harder and faster and cannot hold a conversation while exercising. How can exercise benefit me? Exercising regularly is important. It has many health benefits, such as: Improving overall fitness, flexibility, and endurance. Increasing bone density. Helping with weight control. Decreasing body fat. Increasing muscle strength and endurance. Reducing stress and tension, anxiety, depression, or anger. Improving overall health. What guidelines should I follow while exercising? Before you start a new exercise program, talk with your health care provider. Do not exercise so much that you hurt yourself, feel dizzy, or get very short of breath. Wear comfortable clothes and wear shoes with good support. Drink plenty of water while you exercise to prevent dehydration or heat stroke. Work out until your breathing and your heartbeat get faster (moderate intensity). How often should I exercise? Choose an activity that you enjoy, and set realistic goals. Your health care provider can help you make an activity plan that is individually designed and works best for you. Exercise regularly as told by your health care provider. This may include: Doing strength training two times a week, such as: Lifting weights. Using resistance bands. Push-ups. Sit-ups. Yoga. Doing a certain intensity of exercise for a given amount of time. Choose from these options: A total of 150 minutes of moderate-intensity exercise every week. A total of 75 minutes of vigorous-intensity exercise every week. A mix of moderate-intensity and  vigorous-intensity exercise every week. Children, pregnant women, people who have not exercised regularly, people who are overweight, and older adults may need to talk with a health care provider about what activities are safe to perform. If you have a medical condition, be sure to talk with your health care provider before you start a new exercise program. What are some exercise ideas? Moderate-intensity exercise ideas include: Walking 1 mile (1.6 km) in about 15 minutes. Biking. Hiking. Golfing. Dancing. Water aerobics. Vigorous-intensity exercise ideas include: Walking 4.5 miles (7.2 km) or more in about 1 hour. Jogging or running 5 miles (8 km) in about 1 hour. Biking 10 miles (16.1 km) or more in about 1 hour. Lap swimming. Roller-skating or in-line skating. Cross-country skiing. Vigorous competitive sports, such as football, basketball, and soccer. Jumping rope. Aerobic dancing. What are some everyday activities that can help me get exercise? Yard work, such as: Pushing a lawn mower. Raking and bagging leaves. Washing your car. Pushing a stroller. Shoveling snow. Gardening. Washing windows or floors. How can I be more active in my day-to-day activities? Use stairs instead of an elevator. Take a walk during your lunch break. If you drive, park your car farther away from your work or school. If you take public transportation, get off one stop early and walk the rest of the way. Stand up or walk around during all of your indoor phone calls. Get up, stretch, and walk around every 30 minutes throughout the day. Enjoy exercise with a friend. Support to continue exercising will help you keep a regular routine of activity. Where to find more information You can find more information about exercising to stay healthy from: [place] Department of Health and Human Services: www.hhs.gov Centers for Disease Control and Prevention (  CDC): www.cdc.gov Summary Exercising regularly is  important. It will improve your overall fitness, flexibility, and endurance. Regular exercise will also improve your overall health. It can help you control your weight, reduce stress, and improve your bone density. Do not exercise so much that you hurt yourself, feel dizzy, or get very short of breath. Before you start a new exercise program, talk with your health care provider. This information is not intended to replace advice given to you by your health care provider. Make sure you discuss any questions you have with your health care provider. Document Revised: 11/13/2020 Document Reviewed: 11/13/2020 Elsevier Patient Education  2024 Elsevier Inc.  

## 2023-05-29 NOTE — Assessment & Plan Note (Signed)
I will refer her to Derm for further evaluation/excision. She is in agreement with treatment plan.

## 2023-06-20 ENCOUNTER — Ambulatory Visit: Payer: No Typology Code available for payment source

## 2023-06-20 VITALS — BP 122/88 | HR 75 | Temp 98.1°F | Ht 64.0 in | Wt 252.0 lb

## 2023-06-20 DIAGNOSIS — I10 Essential (primary) hypertension: Secondary | ICD-10-CM

## 2023-06-20 NOTE — Patient Instructions (Signed)
Hypertension, Adult Hypertension is another name for high blood pressure. High blood pressure forces your heart to work harder to pump blood. This can cause problems over time. There are two numbers in a blood pressure reading. There is a top number (systolic) over a bottom number (diastolic). It is best to have a blood pressure that is below 120/80. What are the causes? The cause of this condition is not known. Some other conditions can lead to high blood pressure. What increases the risk? Some lifestyle factors can make you more likely to develop high blood pressure: Smoking. Not getting enough exercise or physical activity. Being overweight. Having too much fat, sugar, calories, or salt (sodium) in your diet. Drinking too much alcohol. Other risk factors include: Having any of these conditions: Heart disease. Diabetes. High cholesterol. Kidney disease. Obstructive sleep apnea. Having a family history of high blood pressure and high cholesterol. Age. The risk increases with age. Stress. What are the signs or symptoms? High blood pressure may not cause symptoms. Very high blood pressure (hypertensive crisis) may cause: Headache. Fast or uneven heartbeats (palpitations). Shortness of breath. Nosebleed. Vomiting or feeling like you may vomit (nauseous). Changes in how you see. Very bad chest pain. Feeling dizzy. Seizures. How is this treated? This condition is treated by making healthy lifestyle changes, such as: Eating healthy foods. Exercising more. Drinking less alcohol. Your doctor may prescribe medicine if lifestyle changes do not help enough and if: Your top number is above 130. Your bottom number is above 80. Your personal target blood pressure may vary. Follow these instructions at home: Eating and drinking  If told, follow the DASH eating plan. To follow this plan: Fill one half of your plate at each meal with fruits and vegetables. Fill one fourth of your plate  at each meal with whole grains. Whole grains include whole-wheat pasta, brown rice, and whole-grain bread. Eat or drink low-fat dairy products, such as skim milk or low-fat yogurt. Fill one fourth of your plate at each meal with low-fat (lean) proteins. Low-fat proteins include fish, chicken without skin, eggs, beans, and tofu. Avoid fatty meat, cured and processed meat, or chicken with skin. Avoid pre-made or processed food. Limit the amount of salt in your diet to less than 1,500 mg each day. Do not drink alcohol if: Your doctor tells you not to drink. You are pregnant, may be pregnant, or are planning to become pregnant. If you drink alcohol: Limit how much you have to: 0-1 drink a day for women. 0-2 drinks a day for men. Know how much alcohol is in your drink. In the [place] one drink equals one 12 oz bottle of beer (355 mL), one 5 oz glass of wine (148 mL), or one 1 oz glass of hard liquor (44 mL). Lifestyle  Work with your doctor to stay at a healthy weight or to lose weight. Ask your doctor what the best weight is for you. Get at least 30 minutes of exercise that causes your heart to beat faster (aerobic exercise) most days of the week. This may include walking, swimming, or biking. Get at least 30 minutes of exercise that strengthens your muscles (resistance exercise) at least 3 days a week. This may include lifting weights or doing Pilates. Do not smoke or use any products that contain nicotine or tobacco. If you need help quitting, ask your doctor. Check your blood pressure at home as told by your doctor. Keep all follow-up visits. Medicines Take over-the-counter and prescription medicines   only as told by your doctor. Follow directions carefully. Do not skip doses of blood pressure medicine. The medicine does not work as well if you skip doses. Skipping doses also puts you at risk for problems. Ask your doctor about side effects or reactions to medicines that you should watch  for. Contact a doctor if: You think you are having a reaction to the medicine you are taking. You have headaches that keep coming back. You feel dizzy. You have swelling in your ankles. You have trouble with your vision. Get help right away if: You get a very bad headache. You start to feel mixed up (confused). You feel weak or numb. You feel faint. You have very bad pain in your: Chest. Belly (abdomen). You vomit more than once. You have trouble breathing. These symptoms may be an emergency. Get help right away. Call 911. Do not wait to see if the symptoms will go away. Do not drive yourself to the hospital. Summary Hypertension is another name for high blood pressure. High blood pressure forces your heart to work harder to pump blood. For most people, a normal blood pressure is less than 120/80. Making healthy choices can help lower blood pressure. If your blood pressure does not get lower with healthy choices, you may need to take medicine. This information is not intended to replace advice given to you by your health care provider. Make sure you discuss any questions you have with your health care provider. Document Revised: 05/06/2021 Document Reviewed: 05/06/2021 Elsevier Patient Education  2024 Elsevier Inc.  

## 2023-06-20 NOTE — Progress Notes (Signed)
Patient presents today bpc. She currently takes Metoprolol Tartrate 25MG . She reports taking in the morning & before bed at night. Denies headache, chest pain & sob. Initial bp: 142/84 Bp taken again after 10 minutes:  BP Readings from Last 3 Encounters:  06/20/23 122/88  05/29/23 (!) 140/88  01/26/23 126/82  Per provider patient is to implement more exercise by walking. Patient aware. If bp reading not better when she returns for march visit medication will be added. Patient advised.

## 2023-07-12 ENCOUNTER — Ambulatory Visit: Payer: Commercial Managed Care - PPO | Admitting: Neurology

## 2023-07-12 ENCOUNTER — Encounter: Payer: Self-pay | Admitting: Neurology

## 2023-07-12 VITALS — BP 140/93 | HR 75 | Ht 64.0 in | Wt 264.5 lb

## 2023-07-12 DIAGNOSIS — M62838 Other muscle spasm: Secondary | ICD-10-CM

## 2023-07-12 DIAGNOSIS — R269 Unspecified abnormalities of gait and mobility: Secondary | ICD-10-CM

## 2023-07-12 DIAGNOSIS — G4726 Circadian rhythm sleep disorder, shift work type: Secondary | ICD-10-CM | POA: Diagnosis not present

## 2023-07-12 DIAGNOSIS — G35 Multiple sclerosis: Secondary | ICD-10-CM | POA: Diagnosis not present

## 2023-07-12 DIAGNOSIS — Z79899 Other long term (current) drug therapy: Secondary | ICD-10-CM | POA: Diagnosis not present

## 2023-07-12 DIAGNOSIS — F418 Other specified anxiety disorders: Secondary | ICD-10-CM

## 2023-07-12 DIAGNOSIS — G4733 Obstructive sleep apnea (adult) (pediatric): Secondary | ICD-10-CM

## 2023-07-12 DIAGNOSIS — R3915 Urgency of urination: Secondary | ICD-10-CM

## 2023-07-12 DIAGNOSIS — R5382 Chronic fatigue, unspecified: Secondary | ICD-10-CM

## 2023-07-12 MED ORDER — PHENTERMINE HCL 30 MG PO CAPS: 30.0000 mg | ORAL_CAPSULE | ORAL | 5 refills | Status: DC

## 2023-07-12 MED ORDER — SERTRALINE HCL 50 MG PO TABS
50.0000 mg | ORAL_TABLET | Freq: Every day | ORAL | 3 refills | Status: DC
Start: 2023-07-12 — End: 2024-06-25

## 2023-07-12 NOTE — Progress Notes (Signed)
[facility]  PATIENT: Courtney Meza DOB: 1986/06/08  REFERRING DOCTOR OR PCP:  Clayton Bibles (referred by) is Dr. Ellyn Hack;  PCP is Dorothyann Peng SOURCE: Patient, notes from Dr. Ellyn Hack, MRI and lab reports, MRI images 3 onPACS.  _________________________________   HISTORICAL  CHIEF COMPLAINT:  Chief Complaint  Patient presents with   Follow-up    Pt in room 10 alone.  Here for MS follow up on Briumvi ,patient reports doing well. No concerns.     HISTORY OF PRESENT ILLNESS:  Courtney Meza is a 37 y.o. woman with RRMS diagnosed early 2018.      Update 07/12/2023:: She switched to Briumvi from DMF due to GI side effects.   Due to gastric sleeve surgery 11/12/2022 .   Additionally, MRI on 09/06/2022 showed a new lesion in the medulla not present on previous MRIs..  Her gait is doing well ad no falls.     She usually uses the bannister but does not need to. She enies weakness or spasticity.   Her leg or arm occasionally jerks - note mostly when in bed.    Rare tingling in neck but nothing bad.   No numbness.     Bladder is fine.   She does not need the oxybutynin as beeter when diuretic was stopped  She notes vision is stable.  She needs contacts.  No difficulty with colors.    She has fatigue, better with armodafinil 250 mg.  She works third shift on weekends (3 days x 12 hours).   She finds she sleeps extra on days she does not work.   She sleep 7-8 hours a night but 12 hours a 24 hour day   She takes a nap many days (works 7 pm to 7 am 3 days a week) on      She has excessive sleepiness and sometimes dozes off.    Had mild OSA AHI = 7.8 09/2021 - before some weight loss   Mood is fine.  Cognition is fine.     Neck pain is worse the past couple weeks..    Baclofen and gabapentin have helped these symptoms.        She is sleepy during the day at times.   Her husband has not noted that she snores..     EPWORTH SLEEPINESS SCALE  On a scale of 0 - 3 what is the  chance of dozing:  Sitting and Reading:   3 Watching TV:    3 Sitting inactive in a public place: 2 Passenger in car for one hour: 1 Lying down to rest in the afternoon: 3 Sitting and talking to someone: 1 Sitting quietly after lunch:  2 In a car, stopped in traffic:  0  Total (out of 24):    15/24  (mild EDS).       MS History:   In April 2006, while driving home she had the sudden onset of reduced right vision like she was looking through a heavy fog.     Vision improved over the next year but not to baseline and has been stable since..   Later in 2006, she had the onset of right facial weakness.  She had two weeks of facial twitching and then had facial weakness that seemed to develop over minutes to one hour.  She was told she had Bell's palsy.  She is uncertain whether she had complete loss of strength in the upper face.  She had no other major symptoms over  the next decade.   However, last year she had numbness in the left arm over a couple weeks The 2006 MRI showed pituitary enlargement.   Prolactin levels were recently elevated and she had a follow-up MRI showing stable pituitary enlargement and a pars intermedius cyst.   On the 09/23/2016 MRI, she had multiple white matter lesions, periventricular > juxtacortical, a left midbrain and right pons lesion.  Brain lesions were not present on the previous MRI.    She has been on Tecfidera or DMF since 2018.   MRI 06/16/2020 showed 1 focus in the spinal cord not clearly present in 2019.  She opted to remain on Tecfidera.  MRI of the brain 09/06/2022 showed a new lesion in the medulla.  IMAGING The MRI of the brain in October 30, 2004 did not show any brain lesions.   However, the second MRI performed 11/07/2004 appeared to show diffusion weighted hyperintensity in the right optic nerve as well as enhancement of the right optic nervenot present on the original 10/30/2004 MRI.      The MRI from 09/23/2016 shows multiple T2/FLAIR hyperintense foci in  the hemispheres, predominantly in the periventricular white matter but also in the juxtacortical white matter. Additionally there are left midbrain and right pontine lesions.   There is questionable optic nerve enhancement.   MRI brain 06/16/2020 showed T2/FLAIR hyperintense foci, predominantly in the periventricular and juxtacortical white matter in a pattern configuration consistent with chronic demyelinating plaque associated with multiple sclerosis.  None of the foci appear to be acute.  They do not enhance.  Compared to the MRI dated 09/23/2016, there are no new lesions..   No acute findings.  Normal enhancement pattern.  MRI cervical spine 06/16/2020 T2 hyperintense foci at the cervicomedullary junction and within the spinal cord adjacent to C2-C3 and adjacent to C4-C5.  These are consistent with chronic demyelination associated with MS.  The focus at C2-C3 was not present on the 2019 MRI and likely represents interval development of a chronic demyelinating plaque.      Degenerative changes at C4-C5 have progressed and there is now mild to moderate spinal stenosis due to a small central disc herniation.  There is no nerve root compression  MRI of the brain 09/06/2022 showed T2/FLAIR hyperintense foci in the cerebral hemispheres predominantly in the periventricular white matter and an additional focus in the posterior right medulla. Foci do not enhance and are consistent with chronic demyelinating plaque associated with multiple sclerosis. The medullary focus was not clearly present on previous MRI and could represent interval demyelination   MRI of the cervical spine 09/06/2022 showed T2 hyperintense foci within the right posterior medulla and adjacent to C2-C3 and C4-C5 in the spinal cord.  The spinal cord foci are unchanged compared to the 2021 MRI while the focus in the medulla has occurred since the 2021 MRI.  Disc protrusion at C4-C5 causing mild spinal stenosis.  The size of the disc protrusion has  improved compared to the 2021 MRI.  There is no nerve root compression.  REVIEW OF SYSTEMS: Constitutional: No fevers, chills, sweats, or change in appetite.   She notes some fatigue. She sometimes has trouble with sleep. Eyes: as above Ear, nose and throat: No hearing loss, ear pain, nasal congestion, sore throat Cardiovascular: No chest pain, palpitations Respiratory:  No shortness of breath at rest or with exertion.   No wheezes GastrointestinaI: No nausea, vomiting, diarrhea, abdominal pain, fecal incontinence Genitourinary:  No dysuria, urinary retention or frequency.  No nocturia. Musculoskeletal:  No neck pain, back pain Integumentary: No rash, pruritus, skin lesions Neurological: as above Psychiatric: No depression at this time.  No anxiety Endocrine: No palpitations, diaphoresis, change in appetite, change in weigh or increased thirst Hematologic/Lymphatic:  No anemia, purpura, petechiae. Allergic/Immunologic: No itchy/runny eyes, nasal congestion, recent allergic reactions, rashes  ALLERGIES: Allergies  Allergen Reactions   Latex Hives   Valsartan Itching    HOME MEDICATIONS:  Current Outpatient Medications:    Armodafinil 250 MG tablet, Take 1/2 to 1 tablet (125-250 mg total) by mouth every morning., Disp: 90 tablet, Rfl: 1   baclofen (LIORESAL) 10 MG tablet, First week or so, take 1/2 tablet by mouth 3 times a day, then take 1 tablet by mouth 3 times a day, Disp: 270 each, Rfl: 1   Cholecalciferol (VITAMIN D3) 5000 units TABS, Take 1 tablet by mouth daily., Disp: , Rfl:    linaclotide (LINZESS) 145 MCG CAPS capsule, Take 1 capsule (145 mcg total) by mouth daily before breakfast., Disp: 90 capsule, Rfl: 0   metoprolol tartrate (LOPRESSOR) 25 MG tablet, TAKE 1 TABLET(25 MG) BY MOUTH TWICE DAILY, Disp: 180 tablet, Rfl: 1   phentermine 30 MG capsule, Take 1 capsule (30 mg total) by mouth every morning., Disp: 30 capsule, Rfl: 5   sertraline (ZOLOFT) 50 MG tablet, Take 1  tablet (50 mg total) by mouth daily., Disp: 90 tablet, Rfl: 3   Ublituximab-xiiy (BRIUMVI) 150 MG/6ML SOLN, Inject into the vein. 6 months, Disp: , Rfl:    gabapentin (NEURONTIN) 300 MG capsule, Take 1 capsule (300 mg total) by mouth 3 (three) times daily. (Patient not taking: Reported on 07/12/2023), Disp: 90 capsule, Rfl: 11   oxybutynin (DITROPAN XL) 10 MG 24 hr tablet, Take 1 tablet (10 mg total) by mouth at bedtime. (Patient not taking: Reported on 05/29/2023), Disp: 90 tablet, Rfl: 3  PAST MEDICAL HISTORY: Past Medical History:  Diagnosis Date   Hypertension    Multiple sclerosis (HCC)    Neuromuscular disorder (HCC)    Ovarian cyst, left 12/19/2016   Vision abnormalities     PAST SURGICAL HISTORY: Past Surgical History:  Procedure Laterality Date   BARIATRIC SURGERY  12/12/2022   gastric sleeve- [facility]   LAPAROSCOPIC OVARIAN CYSTECTOMY Left 12/19/2016   Procedure: LAPAROSCOPIC LEFT OVARIAN CYSTECTOMY LYSIS OF ADHESIONS;  Surgeon: Sherian Rein, MD;  Location: [facility];  Service: Gynecology;  Laterality: Left;   WISDOM TOOTH EXTRACTION      FAMILY HISTORY: Family History  Problem Relation Age of Onset   Diabetes Mother    Hypertension Mother    Healthy Father    Diabetes Maternal Grandmother    Hypertension Maternal Grandmother    Liver cancer Maternal Grandfather     SOCIAL HISTORY:  Social History   Socioeconomic History   Marital status: Married    Spouse name: Not on file   Number of children: Not on file   Years of education: Not on file   Highest education level: Not on file  Occupational History   Not on file  Tobacco Use   Smoking status: Never   Smokeless tobacco: Never  Vaping Use   Vaping status: Never Used  Substance and Sexual Activity   Alcohol use: No   Drug use: No   Sexual activity: Not on file  Other Topics Concern   Not on file  Social History Narrative   Not on file   Social Determinants of Health   Financial Resource  Strain:  Not on file  Food Insecurity: Low Risk  (12/12/2022)   [facility]   Hunger Vital Sign    Worried About Running Out of Food in the Last Year: Never true    Ran Out of Food in the Last Year: Never true  Transportation Needs: No Transportation Needs (12/12/2022)   [facility]   Transportation    In the past 12 months, has lack of reliable transportation kept you from medical appointments, meetings, work or from getting things needed for daily living? : No  Physical Activity: Not on file  Stress: Not on file  Social Connections: Unknown (12/07/2021)   [facility]   Social Network    Social Network: Not on file  Intimate Partner Violence: Unknown (11/05/2021)   [facility]   HITS    Physically Hurt: Not on file    Insult or Talk Down To: Not on file    Threaten Physical Harm: Not on file    Scream or Curse: Not on file     PHYSICAL EXAM  Vitals:   07/12/23 1404 07/12/23 1409  BP: (!) 152/103 (!) 140/93  Pulse: 75   Weight: 264 lb 8 oz (120 kg)   Height: 5\' 4"  (1.626 m)      Body mass index is 45.4 kg/m.  Neck circ:  16 inches  General: The patient is well-developed and well-nourished and in no acute distress.  Pharynx is Mallampate 0.    Neurologic Exam  Mental status: The patient is alert and oriented x 3 at the time of the examination. The patient has apparent normal recent and remote memory, with an apparently normal attention span and concentration ability.   Speech is normal.  Cranial nerves: Extraocular movements are full.  Color vision is now symmetric.    Colors are symmetric. Facial strength and sensation is normal.  Trapezius strength is normal.     No obvious hearing deficits are noted.  Motor:  Muscle bulk is normal.   Tone is normal. Strength is  5 / 5 in all 4 extremities.   Sensory: She has normal sensation to touch and vibration  in the arms and legs.  Coordination: Cerebellar testing shows good finger-nose-finger bilaterally.  Gait and station: Station is normal.   Her gait is normal.  The tandem gait is mildly wide.  Romberg is negative.  Reflexes: Deep tendon reflexes are increased on the left relative to the right.     ASSESSMENT AND PLAN  Multiple sclerosis (HCC)  Shift work sleep disorder  High risk medication use  Chronic fatigue  Urinary urgency  Gait disturbance  Muscle spasms of both lower extremities  OSA (obstructive sleep apnea)  Depression with anxiety   1.    Continue Briumvi.   May need specialty pharmacy.  Next visit will Check IgG/IgM and CD19/CD20 2.    Continue Nuvigil for the shift work disorder/EDS/OSA.     Add Phentermine for these issues and weight.   If BP increases, will sotp. 3.    Continue vitamin D supplementation.  4.   Baclofen prn spasticity 5.    Sertraline for anxity > depression 6.   She will return to see me in 5-6 months or sooner if there are new or worsening neurologic symptoms.     Emillie Chasen A. Epimenio Foot, MD, PhD 07/12/2023, 2:30 PM Certified in Neurology, Clinical Neurophysiology, Sleep Medicine, Pain Medicine and Neuroimaging  [facility] [address] [place], [place] [address] [phone]

## 2023-07-20 ENCOUNTER — Encounter: Payer: Self-pay | Admitting: Neurology

## 2023-07-20 ENCOUNTER — Other Ambulatory Visit (HOSPITAL_COMMUNITY): Payer: Self-pay

## 2023-07-20 MED ORDER — ARMODAFINIL 250 MG PO TABS
125.0000 mg | ORAL_TABLET | Freq: Every morning | ORAL | 1 refills | Status: DC
  Filled 2023-07-20 – 2023-07-27 (×2): qty 90, 90d supply, fill #0
  Filled 2023-10-24: qty 90, 90d supply, fill #1

## 2023-07-20 NOTE — Telephone Encounter (Signed)
Last seen on 07/12/23 Follow up scheduled on 01/25/24 Last filled on 04/30/23 #90 tablets Rx pending to be signed

## 2023-07-27 ENCOUNTER — Other Ambulatory Visit (HOSPITAL_COMMUNITY): Payer: Self-pay

## 2023-07-28 ENCOUNTER — Other Ambulatory Visit (HOSPITAL_COMMUNITY): Payer: Self-pay

## 2023-08-28 ENCOUNTER — Encounter: Payer: Self-pay | Admitting: Internal Medicine

## 2023-08-28 ENCOUNTER — Ambulatory Visit: Payer: No Typology Code available for payment source | Admitting: Internal Medicine

## 2023-08-28 VITALS — BP 132/82 | HR 80 | Temp 98.3°F | Ht 64.0 in | Wt 262.4 lb

## 2023-08-28 DIAGNOSIS — Z6841 Body Mass Index (BMI) 40.0 and over, adult: Secondary | ICD-10-CM

## 2023-08-28 DIAGNOSIS — Z01419 Encounter for gynecological examination (general) (routine) without abnormal findings: Secondary | ICD-10-CM

## 2023-08-28 DIAGNOSIS — R7989 Other specified abnormal findings of blood chemistry: Secondary | ICD-10-CM | POA: Diagnosis not present

## 2023-08-28 DIAGNOSIS — R7309 Other abnormal glucose: Secondary | ICD-10-CM | POA: Diagnosis not present

## 2023-08-28 DIAGNOSIS — G35 Multiple sclerosis: Secondary | ICD-10-CM | POA: Diagnosis not present

## 2023-08-28 DIAGNOSIS — E66813 Obesity, class 3: Secondary | ICD-10-CM

## 2023-08-28 DIAGNOSIS — I10 Essential (primary) hypertension: Secondary | ICD-10-CM

## 2023-08-28 MED ORDER — AMLODIPINE BESYLATE 2.5 MG PO TABS: 2.5000 mg | ORAL_TABLET | Freq: Every day | ORAL | 11 refills | Status: DC

## 2023-08-28 NOTE — Assessment & Plan Note (Signed)
She states her previous GYN has been following her prolactin levels. She was previously referred to Endo, but never went due to insurance issues. She agrees to repeat testing.

## 2023-08-28 NOTE — Progress Notes (Signed)
I,Victoria T Deloria Lair, CMA,acting as a Neurosurgeon for Gwynneth Aliment, MD.,have documented all relevant documentation on the behalf of Gwynneth Aliment, MD,as directed by  Gwynneth Aliment, MD while in the presence of Gwynneth Aliment, MD.  Subjective:  Patient ID: Courtney Meza , female    DOB: [birth year] , 38 y.o.   MRN: [id number]  Chief Complaint  Patient presents with   Hypertension    HPI  Patient here for a blood pressure f/u. She reports compliance with meds.  She denies headaches, chest pain and shortness of breath.   She admits she has not had a recent pap smear.  She wants referral to a new GYN provider, previously seen at [facility].    Additionally, she had recent infusion for MS. States she had elevated BP during her infusion which was treated with three doses of hydralazine. After receiving hydralazine, she had itching inside of her mouth. She had previous infusions without incident. She also adds her BP was elevated over the weekend. She then increased the dose of metoprolol tartrate to 25mg  TWO tabs twice daily.  She states this has helped to improve her BP somewhat.  Lastly, she states Neuro prescribed phentermine for weight loss; however, she has not taken it due to her elevated blood pressure.   Hypertension This is a chronic problem. The current episode started more than 1 year ago. The problem has been waxing and waning since onset. The problem is controlled. Pertinent negatives include no anxiety. There are no associated agents to hypertension. Risk factors for coronary artery disease include obesity and sedentary lifestyle. Past treatments include diuretics and beta blockers. There are no compliance problems.  There is no history of angina. There is no history of chronic renal disease.     Past Medical History:  Diagnosis Date   Hypertension    Multiple sclerosis (HCC)    Neuromuscular disorder (HCC)    Ovarian cyst, left 12/19/2016   Vision abnormalities      Family  History  Problem Relation Age of Onset   Diabetes Mother    Hypertension Mother    Healthy Father    Diabetes Maternal Grandmother    Hypertension Maternal Grandmother    Liver cancer Maternal Grandfather      Current Outpatient Medications:    amLODipine (NORVASC) 2.5 MG tablet, Take 1 tablet (2.5 mg total) by mouth daily., Disp: 30 tablet, Rfl: 11   Armodafinil 250 MG tablet, Take 1/2 to 1 tablet (125-250 mg total) by mouth every morning., Disp: 90 tablet, Rfl: 1   baclofen (LIORESAL) 10 MG tablet, First week or so, take 1/2 tablet by mouth 3 times a day, then take 1 tablet by mouth 3 times a day, Disp: 270 each, Rfl: 1   Cholecalciferol (VITAMIN D3) 5000 units TABS, Take 1 tablet by mouth daily., Disp: , Rfl:    linaclotide (LINZESS) 145 MCG CAPS capsule, Take 1 capsule (145 mcg total) by mouth daily before breakfast., Disp: 90 capsule, Rfl: 0   metoprolol tartrate (LOPRESSOR) 25 MG tablet, TAKE 1 TABLET(25 MG) BY MOUTH TWICE DAILY, Disp: 180 tablet, Rfl: 1   sertraline (ZOLOFT) 50 MG tablet, Take 1 tablet (50 mg total) by mouth daily., Disp: 90 tablet, Rfl: 3   Ublituximab-xiiy (BRIUMVI) 150 MG/6ML SOLN, Inject into the vein. 6 months, Disp: , Rfl:    gabapentin (NEURONTIN) 300 MG capsule, Take 1 capsule (300 mg total) by mouth 3 (three) times daily. (Patient not taking: Reported on 08/28/2023), Disp:  90 capsule, Rfl: 11   oxybutynin (DITROPAN XL) 10 MG 24 hr tablet, Take 1 tablet (10 mg total) by mouth at bedtime. (Patient not taking: Reported on 08/28/2023), Disp: 90 tablet, Rfl: 3   phentermine 30 MG capsule, Take 1 capsule (30 mg total) by mouth every morning. (Patient not taking: Reported on 08/28/2023), Disp: 30 capsule, Rfl: 5   Allergies  Allergen Reactions   Latex Hives   Valsartan Itching     Review of Systems  Constitutional: Negative.   Respiratory: Negative.    Cardiovascular: Negative.   Gastrointestinal: Negative.   Neurological: Negative.   Psychiatric/Behavioral:  Negative.       Today's Vitals   08/28/23 1607  BP: 132/82  Pulse: 80  Temp: 98.3 F (36.8 C)  SpO2: 98%  Weight: 262 lb 6.4 oz (119 kg)  Height: 5\' 4"  (1.626 m)   Body mass index is 45.04 kg/m.  Wt Readings from Last 3 Encounters:  08/28/23 262 lb 6.4 oz (119 kg)  07/12/23 264 lb 8 oz (120 kg)  06/20/23 252 lb (114.3 kg)    BP Readings from Last 3 Encounters:  08/28/23 132/82  07/12/23 (!) 140/93  06/20/23 122/88     Objective:  Physical Exam Vitals and nursing note reviewed.  Constitutional:      Appearance: Normal appearance. She is obese.  HENT:     Head: Normocephalic and atraumatic.  Eyes:     Extraocular Movements: Extraocular movements intact.  Cardiovascular:     Rate and Rhythm: Normal rate and regular rhythm.     Heart sounds: Normal heart sounds.  Pulmonary:     Effort: Pulmonary effort is normal.     Breath sounds: Normal breath sounds.  Musculoskeletal:     Cervical back: Normal range of motion.  Skin:    General: Skin is warm.  Neurological:     General: No focal deficit present.     Mental Status: She is alert.  Psychiatric:        Mood and Affect: Mood normal.        Behavior: Behavior normal.         Assessment And Plan:  Essential hypertension, benign Assessment & Plan: Chronic, uncontrolled. She will continue with metoprolol tartrate 25mg  TWO tabs twice daily. I will add amlodipine 2.5mg  daily in the evenings. She agrees to rto in 2-3 weeks. She will continue to monitor her BP readings at home/work. I will likely need to titrate her to 5mg  dose. I also plan to switch her to long-acting metoprolol in the future. She does wish to conceive in the near future.    Orders: -     CMP14+EGFR  Multiple sclerosis (HCC) Assessment & Plan: Chronic, most recent Neuro notes reviewed. She will continue with Armodafanil, baclofen and Briumvi as per Neuro.    Elevated prolactin level Assessment & Plan: She states her previous GYN has been  following her prolactin levels. She was previously referred to Endo, but never went due to insurance issues. She agrees to repeat testing.   Orders: -     Prolactin -     TSH  Other abnormal glucose Assessment & Plan: Previous labs reviewed, her A1c has been elevated in the past. I will check an A1c today. Reminded to avoid refined sugars including sugary drinks/foods and processed meats including bacon, sausages and deli meats.    Orders: -     Hemoglobin A1c  Class 3 severe obesity due to excess calories with serious comorbidity  and body mass index (BMI) of 45.0 to 49.9 in adult [facility]) Assessment & Plan: Chronic,she is followed at [facility]. Encouraged to incorporate strength training into her workout routine. Additionally, she should follow an anti-inflammatory diet free of processed foods and sugary beverages.    Encounter for gynecological examination -     Ambulatory referral to Gynecology  Other orders -     amLODIPine Besylate; Take 1 tablet (2.5 mg total) by mouth daily.  Dispense: 30 tablet; Refill: 11  She is encouraged to strive for BMI less than 30 to decrease cardiac risk. Advised to aim for at least 150 minutes of exercise per week.    Return in about 22 days (around 09/19/2023), or nurse visit bp check.  Patient was given opportunity to ask questions. Patient verbalized understanding of the plan and was able to repeat key elements of the plan. All questions were answered to their satisfaction.    I, Gwynneth Aliment, MD, have reviewed all documentation for this visit. The documentation on 08/28/23 for the exam, diagnosis, procedures, and orders are all accurate and complete.           IF YOU HAVE BEEN REFERRED TO A SPECIALIST, IT MAY TAKE 1-2 WEEKS TO SCHEDULE/PROCESS THE REFERRAL. IF YOU HAVE NOT HEARD FROM US/SPECIALIST IN TWO WEEKS, PLEASE GIVE [place] A CALL AT [phone].   THE PATIENT IS ENCOURAGED TO PRACTICE SOCIAL DISTANCING DUE TO THE  COVID-19 PANDEMIC.

## 2023-08-28 NOTE — Assessment & Plan Note (Signed)
Previous labs reviewed, her A1c has been elevated in the past. I will check an A1c today. Reminded to avoid refined sugars including sugary drinks/foods and processed meats including bacon, sausages and deli meats.

## 2023-08-28 NOTE — Assessment & Plan Note (Signed)
Chronic, most recent Neuro notes reviewed. She will continue with Armodafanil, baclofen and Briumvi as per Neuro.

## 2023-08-28 NOTE — Patient Instructions (Signed)
Hypertension, Adult Hypertension is another name for high blood pressure. High blood pressure forces your heart to work harder to pump blood. This can cause problems over time. There are two numbers in a blood pressure reading. There is a top number (systolic) over a bottom number (diastolic). It is best to have a blood pressure that is below 120/80. What are the causes? The cause of this condition is not known. Some other conditions can lead to high blood pressure. What increases the risk? Some lifestyle factors can make you more likely to develop high blood pressure: Smoking. Not getting enough exercise or physical activity. Being overweight. Having too much fat, sugar, calories, or salt (sodium) in your diet. Drinking too much alcohol. Other risk factors include: Having any of these conditions: Heart disease. Diabetes. High cholesterol. Kidney disease. Obstructive sleep apnea. Having a family history of high blood pressure and high cholesterol. Age. The risk increases with age. Stress. What are the signs or symptoms? High blood pressure may not cause symptoms. Very high blood pressure (hypertensive crisis) may cause: Headache. Fast or uneven heartbeats (palpitations). Shortness of breath. Nosebleed. Vomiting or feeling like you may vomit (nauseous). Changes in how you see. Very bad chest pain. Feeling dizzy. Seizures. How is this treated? This condition is treated by making healthy lifestyle changes, such as: Eating healthy foods. Exercising more. Drinking less alcohol. Your doctor may prescribe medicine if lifestyle changes do not help enough and if: Your top number is above 130. Your bottom number is above 80. Your personal target blood pressure may vary. Follow these instructions at home: Eating and drinking  If told, follow the DASH eating plan. To follow this plan: Fill one half of your plate at each meal with fruits and vegetables. Fill one fourth of your plate  at each meal with whole grains. Whole grains include whole-wheat pasta, brown rice, and whole-grain bread. Eat or drink low-fat dairy products, such as skim milk or low-fat yogurt. Fill one fourth of your plate at each meal with low-fat (lean) proteins. Low-fat proteins include fish, chicken without skin, eggs, beans, and tofu. Avoid fatty meat, cured and processed meat, or chicken with skin. Avoid pre-made or processed food. Limit the amount of salt in your diet to less than 1,500 mg each day. Do not drink alcohol if: Your doctor tells you not to drink. You are pregnant, may be pregnant, or are planning to become pregnant. If you drink alcohol: Limit how much you have to: 0-1 drink a day for women. 0-2 drinks a day for men. Know how much alcohol is in your drink. In the [place] one drink equals one 12 oz bottle of beer (355 mL), one 5 oz glass of wine (148 mL), or one 1 oz glass of hard liquor (44 mL). Lifestyle  Work with your doctor to stay at a healthy weight or to lose weight. Ask your doctor what the best weight is for you. Get at least 30 minutes of exercise that causes your heart to beat faster (aerobic exercise) most days of the week. This may include walking, swimming, or biking. Get at least 30 minutes of exercise that strengthens your muscles (resistance exercise) at least 3 days a week. This may include lifting weights or doing Pilates. Do not smoke or use any products that contain nicotine or tobacco. If you need help quitting, ask your doctor. Check your blood pressure at home as told by your doctor. Keep all follow-up visits. Medicines Take over-the-counter and prescription medicines  only as told by your doctor. Follow directions carefully. Do not skip doses of blood pressure medicine. The medicine does not work as well if you skip doses. Skipping doses also puts you at risk for problems. Ask your doctor about side effects or reactions to medicines that you should watch  for. Contact a doctor if: You think you are having a reaction to the medicine you are taking. You have headaches that keep coming back. You feel dizzy. You have swelling in your ankles. You have trouble with your vision. Get help right away if: You get a very bad headache. You start to feel mixed up (confused). You feel weak or numb. You feel faint. You have very bad pain in your: Chest. Belly (abdomen). You vomit more than once. You have trouble breathing. These symptoms may be an emergency. Get help right away. Call 911. Do not wait to see if the symptoms will go away. Do not drive yourself to the hospital. Summary Hypertension is another name for high blood pressure. High blood pressure forces your heart to work harder to pump blood. For most people, a normal blood pressure is less than 120/80. Making healthy choices can help lower blood pressure. If your blood pressure does not get lower with healthy choices, you may need to take medicine. This information is not intended to replace advice given to you by your health care provider. Make sure you discuss any questions you have with your health care provider. Document Revised: 05/06/2021 Document Reviewed: 05/06/2021 Elsevier Patient Education  2024 [employer].

## 2023-08-28 NOTE — Assessment & Plan Note (Signed)
Chronic,she is followed at [facility]. Encouraged to incorporate strength training into her workout routine. Additionally, she should follow an anti-inflammatory diet free of processed foods and sugary beverages.

## 2023-08-28 NOTE — Assessment & Plan Note (Signed)
Chronic, uncontrolled. She will continue with metoprolol tartrate 25mg  TWO tabs twice daily. I will add amlodipine 2.5mg  daily in the evenings. She agrees to rto in 2-3 weeks. She will continue to monitor her BP readings at home/work. I will likely need to titrate her to 5mg  dose. I also plan to switch her to long-acting metoprolol in the future. She does wish to conceive in the near future.

## 2023-08-29 LAB — TSH: TSH: 1.83 u[IU]/mL (ref 0.450–4.500)

## 2023-08-29 LAB — CMP14+EGFR
ALT: 17 [IU]/L (ref 0–32)
AST: 14 [IU]/L (ref 0–40)
Albumin: 4.1 g/dL (ref 3.9–4.9)
Alkaline Phosphatase: 114 [IU]/L (ref 44–121)
BUN/Creatinine Ratio: 16 (ref 9–23)
BUN: 12 mg/dL (ref 6–20)
Bilirubin Total: <0.2 mg/dL (ref 0.0–1.2)
CO2: 26 mmol/L (ref 20–29)
Calcium: 9 mg/dL (ref 8.7–10.2)
Chloride: 102 mmol/L (ref 96–106)
Creatinine, Ser: 0.76 mg/dL (ref 0.57–1.00)
Globulin, Total: 3.2 g/dL (ref 1.5–4.5)
Glucose: 93 mg/dL (ref 70–99)
Potassium: 4.3 mmol/L (ref 3.5–5.2)
Sodium: 140 mmol/L (ref 134–144)
Total Protein: 7.3 g/dL (ref 6.0–8.5)
eGFR: 102 mL/min/{1.73_m2} (ref >59)

## 2023-08-29 LAB — PROLACTIN: Prolactin: 61.3 ng/mL — ABNORMAL HIGH (ref 4.8–33.4)

## 2023-08-29 LAB — HEMOGLOBIN A1C
Est. average glucose Bld gHb Est-mCnc: 111 mg/dL
Hgb A1c MFr Bld: 5.5 % (ref 4.8–5.6)

## 2023-09-17 ENCOUNTER — Other Ambulatory Visit: Payer: Self-pay | Admitting: Internal Medicine

## 2023-09-17 DIAGNOSIS — R7989 Other specified abnormal findings of blood chemistry: Secondary | ICD-10-CM

## 2023-09-19 ENCOUNTER — Ambulatory Visit: Payer: No Typology Code available for payment source

## 2023-09-26 ENCOUNTER — Ambulatory Visit: Payer: No Typology Code available for payment source

## 2023-09-26 VITALS — BP 130/85 | HR 75 | Temp 98.8°F | Ht 64.0 in | Wt 262.0 lb

## 2023-09-26 DIAGNOSIS — I10 Essential (primary) hypertension: Secondary | ICD-10-CM

## 2023-09-26 MED ORDER — AMLODIPINE BESYLATE 5 MG PO TABS
5.0000 mg | ORAL_TABLET | Freq: Every day | ORAL | 11 refills | Status: DC
Start: 2023-09-26 — End: 2024-06-25

## 2023-09-26 NOTE — Progress Notes (Signed)
 Patient presents today for a nurse visit. Patient reports compliance with her meds. Patient reports she is taking metoprolol tartrate 25mg  in the morning and evenings and she is also taking amlodipine 2.5mg  in the evenings. I checked her blood pressure today and it is 130/84 P75. I had patient wait 10 minutes and her blood pressure reading was 130/84 P73. Patient advised her goal for her bp is less than 120/80, she is to exercise at least 150 minutes per week. Patient was advised her amlodipine 2.5mg  will be increased to 5mg . Patient will keep her next appt as scheduled.    BP Readings from Last 3 Encounters:  08/28/23 132/82  07/12/23 (!) 140/93  06/20/23 122/88

## 2023-09-26 NOTE — Patient Instructions (Signed)
 Hypertension, Adult Hypertension is another name for high blood pressure. High blood pressure forces your heart to work harder to pump blood. This can cause problems over time. There are two numbers in a blood pressure reading. There is a top number (systolic) over a bottom number (diastolic). It is best to have a blood pressure that is below 120/80. What are the causes? The cause of this condition is not known. Some other conditions can lead to high blood pressure. What increases the risk? Some lifestyle factors can make you more likely to develop high blood pressure: Smoking. Not getting enough exercise or physical activity. Being overweight. Having too much fat, sugar, calories, or salt (sodium) in your diet. Drinking too much alcohol. Other risk factors include: Having any of these conditions: Heart disease. Diabetes. High cholesterol. Kidney disease. Obstructive sleep apnea. Having a family history of high blood pressure and high cholesterol. Age. The risk increases with age. Stress. What are the signs or symptoms? High blood pressure may not cause symptoms. Very high blood pressure (hypertensive crisis) may cause: Headache. Fast or uneven heartbeats (palpitations). Shortness of breath. Nosebleed. Vomiting or feeling like you may vomit (nauseous). Changes in how you see. Very bad chest pain. Feeling dizzy. Seizures. How is this treated? This condition is treated by making healthy lifestyle changes, such as: Eating healthy foods. Exercising more. Drinking less alcohol. Your doctor may prescribe medicine if lifestyle changes do not help enough and if: Your top number is above 130. Your bottom number is above 80. Your personal target blood pressure may vary. Follow these instructions at home: Eating and drinking  If told, follow the DASH eating plan. To follow this plan: Fill one half of your plate at each meal with fruits and vegetables. Fill one fourth of your plate  at each meal with whole grains. Whole grains include whole-wheat pasta, brown rice, and whole-grain bread. Eat or drink low-fat dairy products, such as skim milk or low-fat yogurt. Fill one fourth of your plate at each meal with low-fat (lean) proteins. Low-fat proteins include fish, chicken without skin, eggs, beans, and tofu. Avoid fatty meat, cured and processed meat, or chicken with skin. Avoid pre-made or processed food. Limit the amount of salt in your diet to less than 1,500 mg each day. Do not drink alcohol if: Your doctor tells you not to drink. You are pregnant, may be pregnant, or are planning to become pregnant. If you drink alcohol: Limit how much you have to: 0-1 drink a day for women. 0-2 drinks a day for men. Know how much alcohol is in your drink. In the [place] one drink equals one 12 oz bottle of beer (355 mL), one 5 oz glass of wine (148 mL), or one 1 oz glass of hard liquor (44 mL). Lifestyle  Work with your doctor to stay at a healthy weight or to lose weight. Ask your doctor what the best weight is for you. Get at least 30 minutes of exercise that causes your heart to beat faster (aerobic exercise) most days of the week. This may include walking, swimming, or biking. Get at least 30 minutes of exercise that strengthens your muscles (resistance exercise) at least 3 days a week. This may include lifting weights or doing Pilates. Do not smoke or use any products that contain nicotine or tobacco. If you need help quitting, ask your doctor. Check your blood pressure at home as told by your doctor. Keep all follow-up visits. Medicines Take over-the-counter and prescription medicines  only as told by your doctor. Follow directions carefully. Do not skip doses of blood pressure medicine. The medicine does not work as well if you skip doses. Skipping doses also puts you at risk for problems. Ask your doctor about side effects or reactions to medicines that you should watch  for. Contact a doctor if: You think you are having a reaction to the medicine you are taking. You have headaches that keep coming back. You feel dizzy. You have swelling in your ankles. You have trouble with your vision. Get help right away if: You get a very bad headache. You start to feel mixed up (confused). You feel weak or numb. You feel faint. You have very bad pain in your: Chest. Belly (abdomen). You vomit more than once. You have trouble breathing. These symptoms may be an emergency. Get help right away. Call 911. Do not wait to see if the symptoms will go away. Do not drive yourself to the hospital. Summary Hypertension is another name for high blood pressure. High blood pressure forces your heart to work harder to pump blood. For most people, a normal blood pressure is less than 120/80. Making healthy choices can help lower blood pressure. If your blood pressure does not get lower with healthy choices, you may need to take medicine. This information is not intended to replace advice given to you by your health care provider. Make sure you discuss any questions you have with your health care provider. Document Revised: 05/06/2021 Document Reviewed: 05/06/2021 Elsevier Patient Education  2024 [employer].

## 2023-10-02 ENCOUNTER — Ambulatory Visit (INDEPENDENT_AMBULATORY_CARE_PROVIDER_SITE_OTHER): Payer: Self-pay | Admitting: Internal Medicine

## 2023-10-02 ENCOUNTER — Encounter: Payer: Self-pay | Admitting: Internal Medicine

## 2023-10-02 VITALS — BP 122/74 | HR 75 | Temp 97.8°F | Ht 64.0 in | Wt 256.8 lb

## 2023-10-02 DIAGNOSIS — R7989 Other specified abnormal findings of blood chemistry: Secondary | ICD-10-CM | POA: Diagnosis not present

## 2023-10-02 DIAGNOSIS — Z6841 Body Mass Index (BMI) 40.0 and over, adult: Secondary | ICD-10-CM

## 2023-10-02 DIAGNOSIS — I10 Essential (primary) hypertension: Secondary | ICD-10-CM

## 2023-10-02 DIAGNOSIS — E66813 Obesity, class 3: Secondary | ICD-10-CM | POA: Diagnosis not present

## 2023-10-02 DIAGNOSIS — Z800 Family history of malignant neoplasm of digestive organs: Secondary | ICD-10-CM

## 2023-10-02 MED ORDER — METOPROLOL TARTRATE 25 MG PO TABS
ORAL_TABLET | ORAL | 1 refills | Status: DC
Start: 2023-10-02 — End: 2024-03-12

## 2023-10-02 NOTE — Assessment & Plan Note (Signed)
 Endo referral placed last month, she has yet to be scheduled an appt. I will have referral coordinator call to check on her appt.

## 2023-10-02 NOTE — Patient Instructions (Signed)
 Hypertension, Adult Hypertension is another name for high blood pressure. High blood pressure forces your heart to work harder to pump blood. This can cause problems over time. There are two numbers in a blood pressure reading. There is a top number (systolic) over a bottom number (diastolic). It is best to have a blood pressure that is below 120/80. What are the causes? The cause of this condition is not known. Some other conditions can lead to high blood pressure. What increases the risk? Some lifestyle factors can make you more likely to develop high blood pressure: Smoking. Not getting enough exercise or physical activity. Being overweight. Having too much fat, sugar, calories, or salt (sodium) in your diet. Drinking too much alcohol. Other risk factors include: Having any of these conditions: Heart disease. Diabetes. High cholesterol. Kidney disease. Obstructive sleep apnea. Having a family history of high blood pressure and high cholesterol. Age. The risk increases with age. Stress. What are the signs or symptoms? High blood pressure may not cause symptoms. Very high blood pressure (hypertensive crisis) may cause: Headache. Fast or uneven heartbeats (palpitations). Shortness of breath. Nosebleed. Vomiting or feeling like you may vomit (nauseous). Changes in how you see. Very bad chest pain. Feeling dizzy. Seizures. How is this treated? This condition is treated by making healthy lifestyle changes, such as: Eating healthy foods. Exercising more. Drinking less alcohol. Your doctor may prescribe medicine if lifestyle changes do not help enough and if: Your top number is above 130. Your bottom number is above 80. Your personal target blood pressure may vary. Follow these instructions at home: Eating and drinking  If told, follow the DASH eating plan. To follow this plan: Fill one half of your plate at each meal with fruits and vegetables. Fill one fourth of your plate  at each meal with whole grains. Whole grains include whole-wheat pasta, brown rice, and whole-grain bread. Eat or drink low-fat dairy products, such as skim milk or low-fat yogurt. Fill one fourth of your plate at each meal with low-fat (lean) proteins. Low-fat proteins include fish, chicken without skin, eggs, beans, and tofu. Avoid fatty meat, cured and processed meat, or chicken with skin. Avoid pre-made or processed food. Limit the amount of salt in your diet to less than 1,500 mg each day. Do not drink alcohol if: Your doctor tells you not to drink. You are pregnant, may be pregnant, or are planning to become pregnant. If you drink alcohol: Limit how much you have to: 0-1 drink a day for women. 0-2 drinks a day for men. Know how much alcohol is in your drink. In the [place] one drink equals one 12 oz bottle of beer (355 mL), one 5 oz glass of wine (148 mL), or one 1 oz glass of hard liquor (44 mL). Lifestyle  Work with your doctor to stay at a healthy weight or to lose weight. Ask your doctor what the best weight is for you. Get at least 30 minutes of exercise that causes your heart to beat faster (aerobic exercise) most days of the week. This may include walking, swimming, or biking. Get at least 30 minutes of exercise that strengthens your muscles (resistance exercise) at least 3 days a week. This may include lifting weights or doing Pilates. Do not smoke or use any products that contain nicotine or tobacco. If you need help quitting, ask your doctor. Check your blood pressure at home as told by your doctor. Keep all follow-up visits. Medicines Take over-the-counter and prescription medicines  only as told by your doctor. Follow directions carefully. Do not skip doses of blood pressure medicine. The medicine does not work as well if you skip doses. Skipping doses also puts you at risk for problems. Ask your doctor about side effects or reactions to medicines that you should watch  for. Contact a doctor if: You think you are having a reaction to the medicine you are taking. You have headaches that keep coming back. You feel dizzy. You have swelling in your ankles. You have trouble with your vision. Get help right away if: You get a very bad headache. You start to feel mixed up (confused). You feel weak or numb. You feel faint. You have very bad pain in your: Chest. Belly (abdomen). You vomit more than once. You have trouble breathing. These symptoms may be an emergency. Get help right away. Call 911. Do not wait to see if the symptoms will go away. Do not drive yourself to the hospital. Summary Hypertension is another name for high blood pressure. High blood pressure forces your heart to work harder to pump blood. For most people, a normal blood pressure is less than 120/80. Making healthy choices can help lower blood pressure. If your blood pressure does not get lower with healthy choices, you may need to take medicine. This information is not intended to replace advice given to you by your health care provider. Make sure you discuss any questions you have with your health care provider. Document Revised: 05/06/2021 Document Reviewed: 05/06/2021 Elsevier Patient Education  2024 [employer].

## 2023-10-02 NOTE — Assessment & Plan Note (Signed)
 Chronic, much improved control with amlodipine 5mg  daily and metoprolol 25mg  twice daily.  She will continue with these meds and f/u in July 2025 for her physical exam. She is reminded to follow a low sodium diet.

## 2023-10-02 NOTE — Assessment & Plan Note (Signed)
 Chronic, she is followed by [facility]. She is now on Zepbound, she was congratulated on her weight loss thus far.

## 2023-10-02 NOTE — Progress Notes (Signed)
 I,Victoria T Deloria Lair, CMA,acting as a Neurosurgeon for Gwynneth Aliment, MD.,have documented all relevant documentation on the behalf of Gwynneth Aliment, MD,as directed by  Gwynneth Aliment, MD while in the presence of Gwynneth Aliment, MD.  Subjective:  Patient ID: Courtney Meza , female    DOB: 07/23/1986 , 38 y.o.   MRN: [id number]  Chief Complaint  Patient presents with   Hypertension    HPI  Patient here for a blood pressure f/u. She reports compliance with meds.  She denies headaches, chest pain and shortness of breath.   She is currently waiting for a call for an appointment to complete pap with GYN. She has completed their new patient paperwork.    Hypertension This is a chronic problem. The current episode started more than 1 year ago. The problem has been waxing and waning since onset. The problem is controlled. Pertinent negatives include no anxiety. There are no associated agents to hypertension. Risk factors for coronary artery disease include obesity and sedentary lifestyle. Past treatments include diuretics and beta blockers. There are no compliance problems.  There is no history of angina. There is no history of chronic renal disease.     Past Medical History:  Diagnosis Date   Hypertension    Multiple sclerosis (HCC)    Neuromuscular disorder (HCC)    Ovarian cyst, left 12/19/2016   Vision abnormalities      Family History  Problem Relation Age of Onset   Diabetes Mother    Hypertension Mother    Healthy Father    Diabetes Maternal Grandmother    Hypertension Maternal Grandmother    Liver cancer Maternal Grandfather      Current Outpatient Medications:    amLODipine (NORVASC) 5 MG tablet, Take 1 tablet (5 mg total) by mouth daily., Disp: 30 tablet, Rfl: 11   Armodafinil 250 MG tablet, Take 1/2 to 1 tablet (125-250 mg total) by mouth every morning., Disp: 90 tablet, Rfl: 1   baclofen (LIORESAL) 10 MG tablet, First week or so, take 1/2 tablet by mouth 3 times a  day, then take 1 tablet by mouth 3 times a day, Disp: 270 each, Rfl: 1   Cholecalciferol (VITAMIN D3) 5000 units TABS, Take 1 tablet by mouth daily., Disp: , Rfl:    linaclotide (LINZESS) 145 MCG CAPS capsule, Take 1 capsule (145 mcg total) by mouth daily before breakfast., Disp: 90 capsule, Rfl: 0   sertraline (ZOLOFT) 50 MG tablet, Take 1 tablet (50 mg total) by mouth daily., Disp: 90 tablet, Rfl: 3   Ublituximab-xiiy (BRIUMVI) 150 MG/6ML SOLN, Inject into the vein. 6 months, Disp: , Rfl:    ZEPBOUND 2.5 MG/0.5ML Pen, ADMINISTER 2.5 MG UNDER THE SKIN EVERY 7 DAYS, Disp: , Rfl:    gabapentin (NEURONTIN) 300 MG capsule, Take 1 capsule (300 mg total) by mouth 3 (three) times daily. (Patient not taking: Reported on 07/12/2023), Disp: 90 capsule, Rfl: 11   metoprolol tartrate (LOPRESSOR) 25 MG tablet, TAKE 1 TABLET(25 MG) BY MOUTH TWICE DAILY, Disp: 180 tablet, Rfl: 1   oxybutynin (DITROPAN XL) 10 MG 24 hr tablet, Take 1 tablet (10 mg total) by mouth at bedtime. (Patient not taking: Reported on 05/29/2023), Disp: 90 tablet, Rfl: 3   phentermine 30 MG capsule, Take 1 capsule (30 mg total) by mouth every morning. (Patient not taking: Reported on 10/02/2023), Disp: 30 capsule, Rfl: 5   Allergies  Allergen Reactions   Latex Hives   Valsartan Itching  Review of Systems  Constitutional: Negative.   Respiratory: Negative.    Cardiovascular: Negative.   Gastrointestinal: Negative.   Neurological: Negative.   Psychiatric/Behavioral: Negative.       Today's Vitals   10/02/23 1606  BP: 122/74  Pulse: 75  Temp: 97.8 F (36.6 C)  SpO2: 98%  Weight: 256 lb 12.8 oz (116.5 kg)  Height: 5\' 4"  (1.626 m)   Body mass index is 44.08 kg/m.  Wt Readings from Last 3 Encounters:  10/02/23 256 lb 12.8 oz (116.5 kg)  09/26/23 262 lb (118.8 kg)  08/28/23 262 lb 6.4 oz (119 kg)     Objective:  Physical Exam Vitals and nursing note reviewed.  Constitutional:      Appearance: Normal appearance. She is  obese.  HENT:     Head: Normocephalic and atraumatic.  Eyes:     Extraocular Movements: Extraocular movements intact.  Cardiovascular:     Rate and Rhythm: Normal rate and regular rhythm.     Heart sounds: Normal heart sounds.  Pulmonary:     Effort: Pulmonary effort is normal.     Breath sounds: Normal breath sounds.  Musculoskeletal:     Cervical back: Normal range of motion.  Skin:    General: Skin is warm.  Neurological:     General: No focal deficit present.     Mental Status: She is alert.  Psychiatric:        Mood and Affect: Mood normal.        Behavior: Behavior normal.         Assessment And Plan:  Essential hypertension, benign Assessment & Plan: Chronic, much improved control with amlodipine 5mg  daily and metoprolol 25mg  twice daily.  She will continue with these meds and f/u in July 2025 for her physical exam. She is reminded to follow a low sodium diet.   Orders: -     Metoprolol Tartrate; TAKE 1 TABLET(25 MG) BY MOUTH TWICE DAILY  Dispense: 180 tablet; Refill: 1  Elevated prolactin level Assessment & Plan: Endo referral placed last month, she has yet to be scheduled an appt. I will have referral coordinator call to check on her appt.    Class 3 severe obesity due to excess calories with serious comorbidity and body mass index (BMI) of 45.0 to 49.9 in adult [facility]) Assessment & Plan: Chronic, she is followed by [facility]. She is now on Zepbound, she was congratulated on her weight loss thus far.    Family history of colon cancer in mother -     Ambulatory referral to Gastroenterology     Return if symptoms worsen or fail to improve.  Patient was given opportunity to ask questions. Patient verbalized understanding of the plan and was able to repeat key elements of the plan. All questions were answered to their satisfaction.    I, Gwynneth Aliment, MD, have reviewed all documentation for this visit. The documentation on 10/02/23 for the  exam, diagnosis, procedures, and orders are all accurate and complete.   IF YOU HAVE BEEN REFERRED TO A SPECIALIST, IT MAY TAKE 1-2 WEEKS TO SCHEDULE/PROCESS THE REFERRAL. IF YOU HAVE NOT HEARD FROM US/SPECIALIST IN TWO WEEKS, PLEASE GIVE [place] A CALL AT [phone].   THE PATIENT IS ENCOURAGED TO PRACTICE SOCIAL DISTANCING DUE TO THE COVID-19 PANDEMIC.

## 2023-10-24 ENCOUNTER — Other Ambulatory Visit: Payer: Self-pay

## 2023-10-24 DIAGNOSIS — E221 Hyperprolactinemia: Secondary | ICD-10-CM

## 2023-10-25 ENCOUNTER — Other Ambulatory Visit: Payer: Self-pay

## 2023-10-25 ENCOUNTER — Other Ambulatory Visit (HOSPITAL_COMMUNITY): Payer: Self-pay

## 2023-10-26 ENCOUNTER — Other Ambulatory Visit (HOSPITAL_COMMUNITY): Payer: Self-pay

## 2023-10-30 ENCOUNTER — Other Ambulatory Visit

## 2023-11-02 ENCOUNTER — Ambulatory Visit (INDEPENDENT_AMBULATORY_CARE_PROVIDER_SITE_OTHER): Admitting: "Endocrinology

## 2023-11-02 ENCOUNTER — Encounter: Payer: Self-pay | Admitting: "Endocrinology

## 2023-11-02 VITALS — BP 124/86 | HR 86 | Ht 64.0 in | Wt 249.0 lb

## 2023-11-02 DIAGNOSIS — E236 Other disorders of pituitary gland: Secondary | ICD-10-CM

## 2023-11-02 DIAGNOSIS — E221 Hyperprolactinemia: Secondary | ICD-10-CM | POA: Diagnosis not present

## 2023-11-02 NOTE — Progress Notes (Signed)
 Outpatient Endocrinology Note Courtney Curry, MD    Carnella Guadalajara 1985/12/10 [id number]  Referring Provider: Dorothyann Peng, MD Primary Care Provider: Dorothyann Peng, MD Reason for consultation: Subjective   Assessment & Plan  Diagnoses and all orders for this visit:  Hyperprolactinemia [facility]) -     Cancel: Prolactin  Rathke's cleft cyst (HCC)  History of hyperprolactinemia, diagnosed around 2018, highest recalled prolactin was 70 Most recent prolactin was drawn in 08/28/2023 and was at 61 No history of galactorrhea or irregular menstrual cycle 09/06/22 MR brain W WO contrast Pituitary tissue is shifted mildly to the right and there is a small Rathke cleft cyst in the pituitary gland, unchanged compared to previous MRI. The optic chiasm appear normal.  Is on sertraline (for depression/anxiety) and Ublituximab-xiiy (for MS), both of which can cause elevation of prolactin but reports prolactin was high even before starting those meds Ordered pituitary labs, patient will do them at 8 in the morning  Patient reports primary subfertility, never became pregnant and has been trying Instructed patient to follow-up with fertility treatment/reproductive endocrinology ASAP  Return in about 6 weeks (around 12/14/2023) for visit, 8 am labs next week.   I have reviewed current medications, nurse's notes, allergies, vital signs, past medical and surgical history, family medical history, and social history for this encounter. Counseled patient on symptoms, examination findings, lab findings, imaging results, treatment decisions and monitoring and prognosis. The patient understood the recommendations and agrees with the treatment plan. All questions regarding treatment plan were fully answered.  Courtney Bland, MD  11/02/23   History of Present Illness HPI  Courtney Meza is a 38 y.o. year old female who presents for evaluation of hyperprolactinemia diagnosed around 2018.  09/06/22  MR brain W WO contrast Pituitary tissue is shifted mildly to the right and there is a small Rathke cleft cyst in the pituitary gland, unchanged compared to previous MRI. The optic chiasm appear normal.  No history of galactorrhea or irregular menstrual cycle  She reports the following;  Headaches No visual blurring/ diplopia/ fields defect Yes, right eye, has MS-this was how MS was diagnosed  Galactorrhea No  change in facial appearance  No body habitus No change in her hand, ring, hat, shoe size  No hyperhidrosis No arthralgias No  excess fatigue No weight change Yes s/p gastric sleeve in 11/2022 - lost about 55 lbs change in appetite No heat/cold intolerance No change in bowel movements Yes, constipation  change in muscle strength No changes in skin or hair No, c/o hair fall  Palpitations No insomnia No tremor No  moon faces No fat pads No increased girth  Yes plethora hyperpigmentation No purple striae No acne No, sometime gets it  vellus/terminal hirsutism Yes, some on skin  proximal muscle weakness No nausea/vomiting No lightheadedness No abdominal pain No  Family history is negative for pituitary tumor or other abnormalities concerning for MEN syndrome.   Physical Exam  BP 124/86   Pulse 86   Ht 5\' 4"  (1.626 m)   Wt 249 lb (112.9 kg)   SpO2 98%   BMI 42.74 kg/m    Constitutional: well developed, well nourished Head: normocephalic, atraumatic Eyes: sclera anicteric, no redness Neck: supple Lungs: normal respiratory effort Neurology: alert and oriented Skin: dry, no appreciable rashes Musculoskeletal: no appreciable defects Psychiatric: normal mood and affect   Current Medications Patient's Medications  New Prescriptions   No medications on file  Previous Medications   AMLODIPINE (NORVASC) 5 MG TABLET  Take 1 tablet (5 mg total) by mouth daily.   ARMODAFINIL 250 MG TABLET    Take 1/2 to 1 tablet (125-250 mg total) by mouth every morning.    BACLOFEN (LIORESAL) 10 MG TABLET    First week or so, take 1/2 tablet by mouth 3 times a day, then take 1 tablet by mouth 3 times a day   CHOLECALCIFEROL (VITAMIN D3) 5000 UNITS TABS    Take 1 tablet by mouth daily.   GABAPENTIN (NEURONTIN) 300 MG CAPSULE    Take 1 capsule (300 mg total) by mouth 3 (three) times daily.   LINACLOTIDE (LINZESS) 145 MCG CAPS CAPSULE    Take 1 capsule (145 mcg total) by mouth daily before breakfast.   METOPROLOL TARTRATE (LOPRESSOR) 25 MG TABLET    TAKE 1 TABLET(25 MG) BY MOUTH TWICE DAILY   OXYBUTYNIN (DITROPAN XL) 10 MG 24 HR TABLET    Take 1 tablet (10 mg total) by mouth at bedtime.   PHENTERMINE 30 MG CAPSULE    Take 1 capsule (30 mg total) by mouth every morning.   SERTRALINE (ZOLOFT) 50 MG TABLET    Take 1 tablet (50 mg total) by mouth daily.   UBLITUXIMAB-XIIY (BRIUMVI) 150 MG/6ML SOLN    Inject into the vein. 6 months   ZEPBOUND 2.5 MG/0.5ML PEN    ADMINISTER 2.5 MG UNDER THE SKIN EVERY 7 DAYS  Modified Medications   No medications on file  Discontinued Medications   No medications on file    Allergies Allergies  Allergen Reactions   Latex Hives   Valsartan Itching    Past Medical History Past Medical History:  Diagnosis Date   Hypertension    Multiple sclerosis (HCC)    Neuromuscular disorder (HCC)    Ovarian cyst, left 12/19/2016   Vision abnormalities     Past Surgical History Past Surgical History:  Procedure Laterality Date   BARIATRIC SURGERY  12/12/2022   gastric sleeve- [facility]   LAPAROSCOPIC OVARIAN CYSTECTOMY Left 12/19/2016   Procedure: LAPAROSCOPIC LEFT OVARIAN CYSTECTOMY LYSIS OF ADHESIONS;  Surgeon: Sherian Rein, MD;  Location: [facility];  Service: Gynecology;  Laterality: Left;   WISDOM TOOTH EXTRACTION      Family History family history includes Diabetes in her maternal grandmother and mother; Healthy in her father; Hypertension in her maternal grandmother and mother; Liver cancer in her maternal  grandfather.  Social History Social History   Socioeconomic History   Marital status: Married    Spouse name: Not on file   Number of children: Not on file   Years of education: Not on file   Highest education level: Not on file  Occupational History   Not on file  Tobacco Use   Smoking status: Never   Smokeless tobacco: Never  Vaping Use   Vaping status: Never Used  Substance and Sexual Activity   Alcohol use: No   Drug use: No   Sexual activity: Not on file  Other Topics Concern   Not on file  Social History Narrative   Not on file   Social Drivers of Health   Financial Resource Strain: Not on file  Food Insecurity: Low Risk  (12/12/2022)   [facility]   Hunger Vital Sign    Worried About Running Out of Food in the Last Year: Never true    Ran Out of Food in the Last Year: Never true  Transportation Needs: No Transportation Needs (12/12/2022)   [facility]   Transportation    In the past 12 months, has lack of reliable transportation kept you from medical appointments, meetings, work or from getting things needed for daily living? : No  Physical Activity: Not on file  Stress: Not on file  Social Connections: Unknown (12/07/2021)   [facility]   Social Network    Social Network: Not on file  Intimate Partner Violence: Unknown (11/05/2021)   [facility] from [facility], [facility]   HITS    Physically Hurt: Not on file    Insult or Talk Down To: Not on file    Threaten Physical Harm: Not on file    Scream or Curse: Not on file    Lab Results  Component Value Date   CHOL 180 01/26/2023   Lab Results  Component Value Date   HDL 42 01/26/2023   Lab Results  Component Value Date   LDLCALC 124 (H) 01/26/2023   Lab Results  Component Value Date   TRIG 77 01/26/2023   Lab Results  Component Value Date   CHOLHDL 4.3 01/26/2023   Lab Results  Component Value Date    CREATININE 0.76 08/28/2023   No results found for: "GFR"    Component Value Date/Time   NA 140 08/28/2023 1705   K 4.3 08/28/2023 1705   CL 102 08/28/2023 1705   CO2 26 08/28/2023 1705   GLUCOSE 93 08/28/2023 1705   GLUCOSE 93 12/13/2016 1055   BUN 12 08/28/2023 1705   CREATININE 0.76 08/28/2023 1705   CALCIUM 9.0 08/28/2023 1705   PROT 7.3 08/28/2023 1705   ALBUMIN 4.1 08/28/2023 1705   AST 14 08/28/2023 1705   ALT 17 08/28/2023 1705   ALKPHOS 114 08/28/2023 1705   BILITOT <0.2 08/28/2023 1705   GFRNONAA 74 07/23/2020 1427   GFRAA 85 07/23/2020 1427      Latest Ref Rng & Units 08/28/2023    5:05 PM 01/26/2023    2:59 PM 01/02/2023    4:23 PM  BMP  Glucose 70 - 99 mg/dL 93  89  95   BUN 6 - 20 mg/dL 12  10  9    Creatinine 0.57 - 1.00 mg/dL 8.29  5.62  1.30   BUN/Creat Ratio 9 - 23 16  13  13    Sodium 134 - 144 mmol/L 140  140  141   Potassium 3.5 - 5.2 mmol/L 4.3  4.2  3.9   Chloride 96 - 106 mmol/L 102  101  103   CO2 20 - 29 mmol/L 26  25  26    Calcium 8.7 - 10.2 mg/dL 9.0  9.2  9.3        Component Value Date/Time   WBC 4.3 01/26/2023 1459   WBC 2.6 (L) 12/13/2016 1055   RBC 4.38 01/26/2023 1459   RBC 4.24 12/13/2016 1055   HGB 11.9 01/26/2023 1459   HCT 36.6 01/26/2023 1459   PLT 381 01/26/2023 1459   MCV 84 01/26/2023 1459   MCH 27.2 01/26/2023 1459   MCH 26.9 12/13/2016 1055   MCHC 32.5 01/26/2023 1459   MCHC 32.3 12/13/2016 1055   RDW 13.3 01/26/2023 1459   LYMPHSABS 1.2 01/02/2023 1623   MONOABS 0.2 12/13/2016 1055   EOSABS 0.1 01/02/2023 1623   BASOSABS 0.0 01/02/2023 1623   Lab Results  Component Value Date   TSH 1.830 08/28/2023   TSH 2.150 01/26/2023   TSH 2.320 06/14/2021  Parts of this note may have been dictated using voice recognition software. There may be variances in spelling and vocabulary which are unintentional. Not all errors are proofread. Please notify the [facility] Parkin if any discrepancies are noted or if the meaning of any  statement is not clear.

## 2023-11-09 ENCOUNTER — Other Ambulatory Visit

## 2023-11-15 ENCOUNTER — Ambulatory Visit: Admitting: Podiatry

## 2023-11-15 ENCOUNTER — Other Ambulatory Visit (HOSPITAL_COMMUNITY): Payer: Self-pay

## 2023-11-15 DIAGNOSIS — B351 Tinea unguium: Secondary | ICD-10-CM

## 2023-11-15 DIAGNOSIS — B353 Tinea pedis: Secondary | ICD-10-CM | POA: Diagnosis not present

## 2023-11-15 MED ORDER — KETOCONAZOLE 2 % EX CREA
1.0000 | TOPICAL_CREAM | Freq: Every day | CUTANEOUS | 2 refills | Status: DC
  Filled 2023-11-15: qty 60, 60d supply, fill #0
  Filled 2024-02-15: qty 60, 60d supply, fill #1
  Filled 2024-04-26: qty 60, 60d supply, fill #2

## 2023-11-15 NOTE — Addendum Note (Signed)
 Addended by: Jamie Mccoy on: 11/15/2023 12:46 PM   Modules accepted: Orders

## 2023-11-15 NOTE — Progress Notes (Signed)
  Subjective:  Patient ID: Courtney Meza, female    DOB: 1986-01-14,   MRN: [id number]  No chief complaint on file.   38 y.o. female presents for concern of skin and nail fungus that has been ongoing for years. Has tried OTC trreatmetns but no improvement. Here for further treatment.  . Denies any other pedal complaints. Denies n/v/f/c.   Past Medical History:  Diagnosis Date   Hypertension    Multiple sclerosis (HCC)    Neuromuscular disorder (HCC)    Ovarian cyst, left 12/19/2016   Vision abnormalities     Objective:  Physical Exam: Vascular: DP/PT pulses 2/4 bilateral. CFT <3 seconds. Normal hair growth on digits. No edema.  Skin. No lacerations or abrasions bilateral feet. Bilateral hallux and fifth digit nails thickened and dystrophic. Scaling and xerosis noted to plantar feet bilateral  Musculoskeletal: MMT 5/5 bilateral lower extremities in DF, PF, Inversion and Eversion. Deceased ROM in DF of ankle joint.  Neurological: Sensation intact to light touch.   Assessment:   1. Onychomycosis   2. Tinea pedis of both feet      Plan:  Patient was evaluated and treated and all questions answered. -Examined patient -Discussed treatment options for painful dystrophic nails  -Clinical picture and Fungal culture was obtained by removing a portion of the hard nail itself from each of the involved toenails using a sterile nail nipper and sent to [facility] lab. Patient tolerated the biopsy procedure well without discomfort or need for anesthesia.  -Discussed fungal nail treatment options including oral, topical, and laser treatments.  -Patient to return in 4 weeks for follow up evaluation and discussion of fungal culture results or sooner if symptoms worsen.   Jennefer Moats, DPM

## 2023-11-16 ENCOUNTER — Other Ambulatory Visit

## 2023-11-24 LAB — BASIC METABOLIC PANEL WITH GFR
BUN: 9 mg/dL (ref 7–25)
CO2: 29 mmol/L (ref 20–32)
Calcium: 9.3 mg/dL (ref 8.6–10.2)
Chloride: 103 mmol/L (ref 98–110)
Creat: 0.78 mg/dL (ref 0.50–0.97)
Glucose, Bld: 79 mg/dL (ref 65–99)
Potassium: 4 mmol/L (ref 3.5–5.3)
Sodium: 140 mmol/L (ref 135–146)
eGFR: 99 mL/min/{1.73_m2} (ref >=60)

## 2023-11-24 LAB — PROLACTIN: Prolactin: 40 ng/mL — ABNORMAL HIGH

## 2023-11-24 LAB — INSULIN-LIKE GROWTH FACTOR
IGF-I, LC/MS: 83 ng/mL (ref 53–331)
Z-Score (Female): -1.1 {STDV} (ref ?–2.0)

## 2023-11-24 LAB — ESTRADIOL: Estradiol: 21 pg/mL

## 2023-11-24 LAB — FOLLICLE STIMULATING HORMONE: FSH: 11.8 m[IU]/mL

## 2023-11-24 LAB — GROWTH HORMONE: Growth Hormone: 0.1 ng/mL (ref <=7.1)

## 2023-11-24 LAB — LUTEINIZING HORMONE: LH: 6.8 m[IU]/mL

## 2023-11-24 LAB — TSH: TSH: 1.04 m[IU]/L

## 2023-11-24 LAB — CORTISOL: Cortisol, Plasma: 6.1 ug/dL

## 2023-11-24 LAB — ACTH: C206 ACTH: 14 pg/mL (ref 6–50)

## 2023-11-24 LAB — T4, FREE: Free T4: 1.1 ng/dL (ref 0.8–1.8)

## 2023-11-28 ENCOUNTER — Other Ambulatory Visit: Payer: Self-pay | Admitting: Podiatry

## 2023-11-28 ENCOUNTER — Encounter: Payer: Self-pay | Admitting: Physician Assistant

## 2023-12-12 ENCOUNTER — Other Ambulatory Visit (HOSPITAL_COMMUNITY): Payer: Self-pay

## 2023-12-12 ENCOUNTER — Ambulatory Visit: Payer: No Typology Code available for payment source | Admitting: Dermatology

## 2023-12-12 ENCOUNTER — Encounter: Payer: Self-pay | Admitting: Dermatology

## 2023-12-12 VITALS — BP 134/88

## 2023-12-12 DIAGNOSIS — L821 Other seborrheic keratosis: Secondary | ICD-10-CM | POA: Diagnosis not present

## 2023-12-12 DIAGNOSIS — Z800 Family history of malignant neoplasm of digestive organs: Secondary | ICD-10-CM | POA: Diagnosis not present

## 2023-12-12 MED ORDER — TRETINOIN 0.025 % EX CREA
TOPICAL_CREAM | CUTANEOUS | 1 refills | Status: DC
  Filled 2023-12-12: qty 45, 30d supply, fill #0
  Filled 2024-02-15: qty 45, 30d supply, fill #1

## 2023-12-12 NOTE — Progress Notes (Signed)
 New Patient Visit   Subjective  Courtney Meza is a 38 y.o. female who presents for the following: New Pt - SK's  Courtney Meza presents with concerns about skin spots that she doesn't like. The patient reports that these spots started appearing gradually when she was 38 years old, with the first one appearing under her eye. This initial spot grew to the size of a pea and was subsequently removed.  The patient describes multiple skin lesions, which have been identified as skin tags on the neck and seborrheic keratoses. She expresses dissatisfaction with their appearance, indicating a cosmetic concern. No specific aggravating or alleviating factors are mentioned, nor is there any indication of associated symptoms or impact on daily functioning.  Regarding her medical history, Courtney Meza reports having undergone gastric sleeve surgery last year. She denies any issues with acid reflux. The patient also mentions a family history of colon cancer, with her mother being diagnosed at age 34. She is scheduled for a colonoscopy referral next month, indicating proactive health management.   The following portions of the chart were reviewed this encounter and updated as appropriate: medications, allergies, medical history  Review of Systems:  No other skin or systemic complaints except as noted in HPI or Assessment and Plan.  Objective  Well appearing patient in no apparent distress; mood and affect are within normal limits.   A focused examination was performed of the following areas: face, neck & trunk   Relevant exam findings are noted in the Assessment and Plan.           Assessment & Plan    1. Seborrheic Keratosis (? Sign of Leser-Trlat) - Assessment: Patient presents with multiple skin lesions, identified as skin tags on the neck and seborrheic keratoses. Seborrheic keratoses began appearing at age 4, with one removed from under the eye when it grew to the size of a pea.  Concern for increased risk of gastrointestinal cancers due to young age, numerous seborrheic keratoses, and family history of colon cancer in patient's mother at age 66. Patient has a history of gastric sleeve surgery last year. - Plan:    Recommend colonoscopy and esophagogastroduodenoscopy (EGD) for cancer screening.  Also recommended she stay current with Mammogram in Sept after she turns 65     - Patient to follow up on existing referral for colonoscopy next month     - Advised screenings to be performed every 5 years if initial results are clear    Discuss cosmetic removal options:     - Use of numbing cream and hyper-care machine     - Cost starts at $200 for up to 15 spots     - Inform patient of potential for dark spots post-removal, treatable with lightening creams    Prescribe tretinoin 0.025% for prevention of new lesions:     - Start with application 2 nights per week for the first month     - Increase to 3 nights per week if no irritation     - Use pea-sized amount for whole face, followed by moisturizer     - For neck application, dilute cream with moisturizer    Provide face wash and moisturizers compatible with tretinoin    Schedule follow-up in 6 months to assess tretinoin efficacy and adjust strength if needed  2. Family history of colon cancer - Assessment: Patient reports maternal history of colon cancer diagnosed at age 45. This family history, combined with the presence of multiple seborrheic keratoses  at a young age, raises concern for increased risk of gastrointestinal cancers. - Plan:    Emphasize importance of following through with scheduled colonoscopy referral    Recommend regular screenings for gastrointestinal cancers, including colonoscopy and EGD    Advise patient to follow up with gastroenterologist regarding screening schedule and risk assessment   No follow-ups on file.   Documentation: I have reviewed the above documentation for accuracy and  completeness, and I agree with the above.  I, Shirron Louanne Roussel, CMA, am acting as scribe for [employer], DO.   Louana Roup, DO

## 2023-12-12 NOTE — Patient Instructions (Addendum)
 Date: Tue Dec 12 2023  Hello Courtney Meza,  Thank you for visiting today. Here is a summary of the key instructions:  - Tretinoin 0.025% cream:   - Use 2 nights a week for the first month   - Apply a pea-sized amount to the face   - Put moisturizer on top after applying tretinoin   - Increase to 3 nights a week if no irritation occurs   - Use Monday, Wednesday, and Friday nights  - Skin Care:   - Use face wash and moisturizers provided   - Moisturize face every night, even when not using tretinoin   - Wait before applying tretinoin to neck and chest  - Medical Procedures:   - Schedule colonoscopy as planned   - Consider asking for an EGD (upper endoscopy) on the same day as colonoscopy   - Also make sure you stay up to date on your Mammograms  - Follow-up: Return for dermatology follow-up every 6 months  Please reach out if you have any questions or concerns.  Warm regards,  Dr. Louana Roup, Dermatology   Important Information  Due to recent changes in healthcare laws, you may see results of your pathology and/or laboratory studies on MyChart before the doctors have had a chance to review them. We understand that in some cases there may be results that are confusing or concerning to you. Please understand that not all results are received at the same time and often the doctors may need to interpret multiple results in order to provide you with the best plan of care or course of treatment. Therefore, we ask that you please give us  2 business days to thoroughly review all your results before contacting the office for clarification. Should we see a critical lab result, you will be contacted sooner.   If You Need Anything After Your Visit  If you have any questions or concerns for your doctor, please call our main line at [phone] If no one answers, please leave a voicemail as directed and we will return your call as soon as possible. Messages left after 4 pm will be  answered the following business day.   You may also send us  a message via MyChart. We typically respond to MyChart messages within 1-2 business days.  For prescription refills, please ask your pharmacy to contact our office. Our fax number is [phone].  If you have an urgent issue when the clinic is closed that cannot wait until the next business day, you can page your doctor at the number below.    Please note that while we do our best to be available for urgent issues outside of office hours, we are not available 24/7.   If you have an urgent issue and are unable to reach us , you may choose to seek medical care at your doctor's office, retail clinic, urgent care center, or emergency room.  If you have a medical emergency, please immediately call 911 or go to the emergency department. In the event of inclement weather, please call our main line at [phone] for an update on the status of any delays or closures.  Dermatology Medication Tips: Please keep the boxes that topical medications come in in order to help keep track of the instructions about where and how to use these. Pharmacies typically print the medication instructions only on the boxes and not directly on the medication tubes.   If your medication is too expensive, please contact our office at [phone] or send us   a message through MyChart.   We are unable to tell what your co-pay for medications will be in advance as this is different depending on your insurance coverage. However, we may be able to find a substitute medication at lower cost or fill out paperwork to get insurance to cover a needed medication.   If a prior authorization is required to get your medication covered by your insurance company, please allow us  1-2 business days to complete this process.  Drug prices often vary depending on where the prescription is filled and some pharmacies may offer cheaper prices.  The website www.goodrx.com contains  coupons for medications through different pharmacies. The prices here do not account for what the cost may be with help from insurance (it may be cheaper with your insurance), but the website can give you the price if you did not use any insurance.  - You can print the associated coupon and take it with your prescription to the pharmacy.  - You may also stop by our office during regular business hours and pick up a GoodRx coupon card.  - If you need your prescription sent electronically to a different pharmacy, notify our office through [facility] or by phone at [phone]

## 2023-12-19 ENCOUNTER — Other Ambulatory Visit (HOSPITAL_COMMUNITY): Payer: Self-pay

## 2023-12-20 ENCOUNTER — Other Ambulatory Visit (HOSPITAL_COMMUNITY): Payer: Self-pay

## 2023-12-20 ENCOUNTER — Ambulatory Visit: Admitting: "Endocrinology

## 2023-12-20 MED ORDER — TERCONAZOLE 0.4 % VA CREA
1.0000 | TOPICAL_CREAM | Freq: Every day | VAGINAL | 3 refills | Status: AC
  Filled 2023-12-20: qty 45, 7d supply, fill #0
  Filled 2024-02-15: qty 45, 7d supply, fill #1
  Filled 2024-03-31: qty 45, 7d supply, fill #2
  Filled 2024-04-26: qty 45, 7d supply, fill #3

## 2023-12-21 DIAGNOSIS — D352 Benign neoplasm of pituitary gland: Secondary | ICD-10-CM | POA: Insufficient documentation

## 2023-12-21 DIAGNOSIS — D251 Intramural leiomyoma of uterus: Secondary | ICD-10-CM | POA: Insufficient documentation

## 2023-12-21 DIAGNOSIS — N979 Female infertility, unspecified: Secondary | ICD-10-CM | POA: Insufficient documentation

## 2024-01-01 ENCOUNTER — Other Ambulatory Visit (HOSPITAL_COMMUNITY): Payer: Self-pay

## 2024-01-01 ENCOUNTER — Encounter: Payer: Self-pay | Admitting: Podiatry

## 2024-01-01 ENCOUNTER — Ambulatory Visit: Admitting: Podiatry

## 2024-01-01 DIAGNOSIS — B351 Tinea unguium: Secondary | ICD-10-CM | POA: Diagnosis not present

## 2024-01-01 NOTE — Progress Notes (Signed)
  Subjective:  Patient ID: Rubie Corona, female    DOB: 1986/01/25,   MRN: [id number]  Chief Complaint  Patient presents with   Results    Rm24 review nail pathology    38 y.o. female presents for follow-up of nail fungus and to discuss culture results.  . Denies any other pedal complaints. Denies n/v/f/c.   Past Medical History:  Diagnosis Date   Hypertension    Multiple sclerosis (HCC)    Neuromuscular disorder (HCC)    Ovarian cyst, left 12/19/2016   Vision abnormalities     Objective:  Physical Exam: Vascular: DP/PT pulses 2/4 bilateral. CFT <3 seconds. Normal hair growth on digits. No edema.  Skin. No lacerations or abrasions bilateral feet. Bilateral hallux and fifth digit nails thickened and dystrophic. Scaling and xerosis noted to plantar feet bilateral  Musculoskeletal: MMT 5/5 bilateral lower extremities in DF, PF, Inversion and Eversion. Deceased ROM in DF of ankle joint.  Neurological: Sensation intact to light touch.   Assessment:   1. Onychomycosis       Plan:  Patient was evaluated and treated and all questions answered. -Examined patient -Discussed treatment options for painful dystrophic nails  -Culture positive for fungus -Discussed fungal nail treatment options including oral, topical, and laser treatments.  -Would like to try lamisil. 90 days sent to pharmacy  LFTs previously reviewed and wnl.  -Patient to return in 3 months for recheck   Jennefer Moats, DPM

## 2024-01-03 ENCOUNTER — Other Ambulatory Visit: Payer: Self-pay | Admitting: Podiatry

## 2024-01-03 ENCOUNTER — Other Ambulatory Visit (HOSPITAL_COMMUNITY): Payer: Self-pay

## 2024-01-03 ENCOUNTER — Encounter: Payer: Self-pay | Admitting: Podiatry

## 2024-01-03 MED ORDER — TERBINAFINE HCL 250 MG PO TABS
250.0000 mg | ORAL_TABLET | Freq: Every day | ORAL | 2 refills | Status: AC
  Filled 2024-01-03: qty 30, 30d supply, fill #0
  Filled 2024-02-15: qty 30, 30d supply, fill #1
  Filled 2024-03-31: qty 30, 30d supply, fill #2

## 2024-01-06 ENCOUNTER — Other Ambulatory Visit (HOSPITAL_COMMUNITY): Payer: Self-pay

## 2024-01-11 ENCOUNTER — Other Ambulatory Visit (HOSPITAL_COMMUNITY): Payer: Self-pay

## 2024-01-11 MED ORDER — ZEPBOUND 7.5 MG/0.5ML ~~LOC~~ SOAJ
7.5000 mg | SUBCUTANEOUS | 2 refills | Status: DC
  Filled 2024-01-11: qty 2, 28d supply, fill #0
  Filled 2024-02-15: qty 2, 28d supply, fill #1

## 2024-01-17 NOTE — Progress Notes (Signed)
 01/18/2024 Courtney Meza [id number] 1986-02-26  Referring provider: Jarold Medici, MD Primary GI doctor: Dr. Federico  ASSESSMENT AND PLAN:  Chronic constipation  exacerbated post-gastric sleeve and Zepbound . Linzess  effective but caused diarrhea. Current Senna and docusate regimen increases frequency but requires straining. Multiple sclerosis may contribute. Advised against daily stimulant laxatives due to dependency risk. Discussed Isbrela trial with Trulance as alternative. Colonoscopy recommended due to family history of colon cancer and rectal bleeding. - Provide Isbrela samples for trial. - If Isbrela ineffective, prescribe Trulance. - Educated on avoiding daily stimulant laxatives like Senokot due to dependency risk. - Recommend squatty potty for bowel movement aid. - Schedule colonoscopy with two-day prep due to family history of colon cancer and rectal bleeding. - Discussed colonoscopy risks including rare perforation, bleeding, and anesthesia-related complications.  Family history of colon cancer in mother Diagnosed at age of 44  GERD Intermittent, on omeprazole and well controlled No NSAIDS, no ETOH, no tobacco No melena, no dysphagia -alginate therapy given -Lifestyle changes discussed, avoid NSAIDS, ETOH, hand out given to the patient -Weight loss discussed with the patient  Morbid obesity  S/p gastric sleeve surgery last year On Zepbound  x 5 months, follows [facility] Body mass index is 40.85 kg/m.  -Patient has been advised to make an attempt to improve diet and exercise patterns to aid in weight loss. -Recommended diet heavy in fruits and veggies and low in animal meats, cheeses, and dairy products, appropriate calorie intake  Multiple sclerosis Follows with Dr. Suanne She is on infusion every 6 months  Patient Care Team: Jarold Medici, MD as PCP - General (Internal Medicine)  HISTORY OF PRESENT ILLNESS: 38 y.o. female with a past medical  history listed below presents as a new patient for evaluation of CIC and family history of colon cancer.   Discussed the use of AI scribe software for clinical note transcription with the patient, who gave verbal consent to proceed.  History of Present Illness   Courtney Meza is a 38 year old female with lifelong constipation who presents with worsening constipation and rectal bleeding.  She has experienced lifelong constipation, which has progressively worsened. Following gastric sleeve surgery last year, she began taking Zepbound  for the past five to six months, which she notes can exacerbate constipation. Her bowel movements occur approximately once a week, but with daily use of Senna and docusate, frequency has increased to three times a week, though significant straining persists. She previously tried Linzess  at a 145 mcg dose, which was effective but caused very loose stools. Other treatments like Miralax, Senokot, and Amitiza have not provided satisfactory results.  She reports rectal bleeding, described as bright red blood, occurring only with straining. No abdominal pain, bloating, or discomfort is present, but she experiences significant flatulence. She has a history of reflux, managed with omeprazole, and notes it is generally controlled. No nausea, vomiting, or dysphagia currently, though she has experienced swallowing difficulties in the past.  Her family history is significant for her mother being diagnosed with colon cancer at the age of 30. She has two younger brothers, aged 14 and 73.  She has a diagnosis of multiple sclerosis, which is currently controlled with biannual infusions. She does not smoke, consume alcohol, or use NSAIDs.        She  reports that she has never smoked. She has never used smokeless tobacco. She reports that she does not drink alcohol and does not use drugs.  RELEVANT GI HISTORY, IMAGING AND  LABS: Results          CBC    Component Value  Date/Time   WBC 4.3 01/26/2023 1459   WBC 2.6 (L) 12/13/2016 1055   RBC 4.38 01/26/2023 1459   RBC 4.24 12/13/2016 1055   HGB 11.9 01/26/2023 1459   HCT 36.6 01/26/2023 1459   PLT 381 01/26/2023 1459   MCV 84 01/26/2023 1459   MCH 27.2 01/26/2023 1459   MCH 26.9 12/13/2016 1055   MCHC 32.5 01/26/2023 1459   MCHC 32.3 12/13/2016 1055   RDW 13.3 01/26/2023 1459   LYMPHSABS 1.2 01/02/2023 1623   MONOABS 0.2 12/13/2016 1055   EOSABS 0.1 01/02/2023 1623   BASOSABS 0.0 01/02/2023 1623   Recent Labs    01/26/23 1459  HGB 11.9    CMP     Component Value Date/Time   NA 140 11/16/2023 1039   NA 140 08/28/2023 1705   K 4.0 11/16/2023 1039   CL 103 11/16/2023 1039   CO2 29 11/16/2023 1039   GLUCOSE 79 11/16/2023 1039   BUN 9 11/16/2023 1039   BUN 12 08/28/2023 1705   CREATININE 0.78 11/16/2023 1039   CALCIUM 9.3 11/16/2023 1039   PROT 7.3 08/28/2023 1705   ALBUMIN 4.1 08/28/2023 1705   AST 14 08/28/2023 1705   ALT 17 08/28/2023 1705   ALKPHOS 114 08/28/2023 1705   BILITOT <0.2 08/28/2023 1705   GFRNONAA 74 07/23/2020 1427   GFRAA 85 07/23/2020 1427      Latest Ref Rng & Units 08/28/2023    5:05 PM 01/26/2023    2:59 PM 01/02/2023    4:23 PM  Hepatic Function  Total Protein 6.0 - 8.5 g/dL 7.3  7.3  6.9   Albumin 3.9 - 4.9 g/dL 4.1  4.2  3.9   AST 0 - 40 IU/L 14  12  12    ALT 0 - 32 IU/L 17  12  12    Alk Phosphatase 44 - 121 IU/L 114  117  100   Total Bilirubin 0.0 - 1.2 mg/dL <9.7  0.3  0.3       Current Medications:    Current Outpatient Medications (Cardiovascular):    amLODipine  (NORVASC ) 5 MG tablet, Take 1 tablet (5 mg total) by mouth daily.   metoprolol  tartrate (LOPRESSOR ) 25 MG tablet, TAKE 1 TABLET(25 MG) BY MOUTH TWICE DAILY (Patient taking differently: Take 50 mg by mouth 2 (two) times daily. TAKE 1 TABLET(25 MG) BY MOUTH TWICE DAILY)     Current Outpatient Medications (Other):    Armodafinil  250 MG tablet, Take 1/2 to 1 tablet (125-250 mg total) by  mouth every morning.   baclofen  (LIORESAL ) 10 MG tablet, First week or so, take 1/2 tablet by mouth 3 times a day, then take 1 tablet by mouth 3 times a day   Cholecalciferol (VITAMIN D3) 5000 units TABS, Take 1 tablet by mouth daily.   ketoconazole  (NIZORAL ) 2 % cream, Apply 1 Application topically daily.   linaclotide  (LINZESS ) 145 MCG CAPS capsule, Take 1 capsule (145 mcg total) by mouth daily before breakfast.   oxybutynin  (DITROPAN  XL) 10 MG 24 hr tablet, Take 1 tablet (10 mg total) by mouth at bedtime.   sertraline  (ZOLOFT ) 50 MG tablet, Take 1 tablet (50 mg total) by mouth daily.   terbinafine  (LAMISIL ) 250 MG tablet, Take 1 tablet (250 mg total) by mouth daily.   terconazole  (TERAZOL 7 ) 0.4 % vaginal cream, Place externally at bedtime for 7 days   tirzepatide  (  ZEPBOUND ) 7.5 MG/0.5ML Pen, Inject 7.5 mg into the skin once a week.   tretinoin  (RETIN-A ) 0.025 % cream, Apply pea size amount to the face 2 nights a week on Tu/Th then increase to 3 nights a week in 1 month on M/W/F.   Ublituximab-xiiy (BRIUMVI) 150 MG/6ML SOLN, Inject into the vein. 6 months   gabapentin  (NEURONTIN ) 300 MG capsule, Take 1 capsule (300 mg total) by mouth 3 (three) times daily.   ZEPBOUND  2.5 MG/0.5ML Pen, ADMINISTER 2.5 MG UNDER THE SKIN EVERY 7 DAYS  Medical History:  Past Medical History:  Diagnosis Date   Hypertension    Multiple sclerosis (HCC)    Neuromuscular disorder (HCC)    Ovarian cyst, left 12/19/2016   Vision abnormalities    Allergies:  Allergies  Allergen Reactions   Latex Hives   Valsartan Itching     Surgical History:  She  has a past surgical history that includes Wisdom tooth extraction; Laparoscopic ovarian cystectomy (Left, 12/19/2016); and Bariatric Surgery (12/12/2022). Family History:  Her family history includes Diabetes in her maternal grandmother and mother; Healthy in her father; Hypertension in her maternal grandmother and mother; Liver cancer in her maternal  grandfather.  REVIEW OF SYSTEMS  : All other systems reviewed and negative except where noted in the History of Present Illness.  PHYSICAL EXAM: BP 126/80   Pulse 90   Ht 5' 4 (1.626 m)   Wt 238 lb (108 kg)   BMI 40.85 kg/m  Physical Exam   GENERAL APPEARANCE: Well nourished, in no apparent distress. HEENT: No cervical lymphadenopathy, unremarkable thyroid, sclerae anicteric, conjunctiva pink. RESPIRATORY: Respiratory effort normal, breath sounds equal bilateral without rales, rhonchi, wheezing. CARDIO: Regular rate and rhythm with no murmurs, rubs, or gallops, peripheral pulses intact. ABDOMEN: Soft, non-distended, active bowel sounds in all four quadrants, no tenderness to palpation, no rebound, no mass appreciated. RECTAL: Declines. MUSCULOSKELETAL: Full range of motion, normal gait, without edema. SKIN: Dry, intact without rashes or lesions. No jaundice. NEURO: Alert, oriented, no focal deficits. PSYCH: Cooperative, normal mood and affect.      Alan JONELLE Coombs, PA-C 10:18 AM

## 2024-01-18 ENCOUNTER — Encounter: Payer: Self-pay | Admitting: Physician Assistant

## 2024-01-18 ENCOUNTER — Ambulatory Visit: Admitting: Physician Assistant

## 2024-01-18 ENCOUNTER — Other Ambulatory Visit (HOSPITAL_COMMUNITY): Payer: Self-pay

## 2024-01-18 VITALS — BP 126/80 | HR 90 | Ht 64.0 in | Wt 238.0 lb

## 2024-01-18 DIAGNOSIS — G35 Multiple sclerosis: Secondary | ICD-10-CM | POA: Diagnosis not present

## 2024-01-18 DIAGNOSIS — K5909 Other constipation: Secondary | ICD-10-CM

## 2024-01-18 DIAGNOSIS — K625 Hemorrhage of anus and rectum: Secondary | ICD-10-CM | POA: Diagnosis not present

## 2024-01-18 DIAGNOSIS — Z800 Family history of malignant neoplasm of digestive organs: Secondary | ICD-10-CM

## 2024-01-18 DIAGNOSIS — K219 Gastro-esophageal reflux disease without esophagitis: Secondary | ICD-10-CM

## 2024-01-18 DIAGNOSIS — Z6841 Body Mass Index (BMI) 40.0 and over, adult: Secondary | ICD-10-CM

## 2024-01-18 DIAGNOSIS — K5904 Chronic idiopathic constipation: Secondary | ICD-10-CM

## 2024-01-18 DIAGNOSIS — Z9884 Bariatric surgery status: Secondary | ICD-10-CM

## 2024-01-18 MED ORDER — NA SULFATE-K SULFATE-MG SULF 17.5-3.13-1.6 GM/177ML PO SOLN
1.0000 | Freq: Once | ORAL | 0 refills | Status: AC
  Filled 2024-01-18: qty 354, 1d supply, fill #0

## 2024-01-18 NOTE — Progress Notes (Signed)
 I agree with the assessment and plan as outlined by Ms. Steffanie Dunn.

## 2024-01-18 NOTE — Patient Instructions (Addendum)
 Can do trial of isbrela for IBS constipation Take Ibsrela (tenapanor) 5 to 10 minutes before eating breakfast and dinner. This can make the medication work better for you compared to taking it on an empty stomach Stop if you have any significant diarrhea/dehydration.  Recommend starting on a fiber supplement, can try metamucil first but if this causes gas/bloating switch to benefiber or citracel, these do not cause gas.  Take with fiber with with a full 8 oz glass of water once a day. This can take 1 month to start helping, so try for at least one month.  Recommend increasing water and physical activity.   - Drink at least 64-80 ounces of water/liquid per day. - Establish a time to try to move your bowels every day.  For many people, this is after a cup of coffee or after a meal such as breakfast. - Sit all of the way back on the toilet keeping your back fairly straight and while sitting up, try to rest the tops of your forearms on your upper thighs.   - Raising your feet with a step stool/squatty potty can be helpful to improve the angle that allows your stool to pass through the rectum. - Relax the rectum feeling it bulge toward the toilet water.  If you feel your rectum raising toward your body, you are contracting rather than relaxing. - Breathe in and slowly exhale. Belly breath by expanding your belly towards your belly button. Keep belly expanded as you gently direct pressure down and back to the anus.  A low pitched GRRR sound can assist with increasing intra-abdominal pressure.  (Can also trying to blow on a pinwheel and make it move, this helps with the same belly breathing) - Repeat 3-4 times. If unsuccessful, contract the pelvic floor to restore normal tone and get off the toilet.  Avoid excessive straining. - To reduce excessive wiping by teaching your anus to normally contract, place hands on outer aspect of knees and resist knee movement outward.  Hold 5-10 second then place hands  just inside of knees and resist inward movement of knees.  Hold 5 seconds.  Repeat a few times each way.  Go to the ER if unable to pass gas, severe AB pain, unable to hold down food, any shortness of breath of chest pain.   Avoid spicy and acidic foods Avoid fatty foods Limit your intake of coffee, tea, alcohol, and carbonated drinks Work to maintain a healthy weight Keep the head of the bed elevated at least 3 inches with blocks or a wedge pillow if you are having any nighttime symptoms Stay upright for 2 hours after eating Avoid meals and snacks three to four hours before bedtime   Reflux Gourmet Rescue (samples given)  It is an ALGINATE THERAPY which is the only intervention that works to safeguard the esophagus by creating a protective barrier that actually stops reflux from happening. -The general directions for use are as stated on the packaging: Take 1 teaspoon (5 ml), or more as needed or as directed by your physician, after meals and before bed. -These general directions address the most common times for reflux to occur, but our Rescue products may be taken anytime. Some individuals may take our product preemptively, when they know they will suffer from reflux, or as needed - when discomfort arises. (If taken around food, it should be consumed last.) -You do not have to take 1 teaspoon (5 ml) of the product. While one teaspoon (5ml) may  be the perfect average amount to relieve reflux suffering in some, others may require more or less. You may adjust the amount of Mint Chocolate Rescue and Vanilla Caramel Rescue to the lowest amount necessary to meet your individual needs to improve your quality of life. -You may dilute the product if it is too viscous for you to consume. Keep in mind, however, that the thickness of the product was formulated to provide optimal coating and protection of your throat and esophagus. Though diluting the product is possible, it may reduce the protective  function and/or length of action. -This can be used in conjunction with reflux medications and lifestyle changes.  100% ALL-NATURAL  Paraben FREE, glycerin FREE, & potassium FREE  Made entirely from all-natural ingredients considered safe for children and during pregnancy  No known side effects  All-natural flavor Gluten FREE  Allergen FREE  Vegan  Can find more information here: [email or web address]   You have been scheduled for a colonoscopy. Please follow written instructions given to you at your visit today.   If you use inhalers (even only as needed), please bring them with you on the day of your procedure.  DO NOT TAKE 7 DAYS PRIOR TO TEST- Trulicity (dulaglutide) Ozempic, Wegovy (semaglutide) Mounjaro  (tirzepatide ) Bydureon Bcise (exanatide extended release)  ZEPBOUND   DO NOT TAKE 1 DAY PRIOR TO YOUR TEST Rybelsus (semaglutide) Adlyxin (lixisenatide) Victoza (liraglutide ) Byetta (exanatide) ___________________________________________________________________________    Due to recent changes in healthcare laws, you may see the results of your imaging and laboratory studies on MyChart before your provider has had a chance to review them.  We understand that in some cases there may be results that are confusing or concerning to you. Not all laboratory results come back in the same time frame and the provider may be waiting for multiple results in order to interpret others.  Please give us  48 hours in order for your provider to thoroughly review all the results before contacting the office for clarification of your results.    I appreciate the  opportunity to care for you  Thank You   [facility]

## 2024-01-19 ENCOUNTER — Other Ambulatory Visit (HOSPITAL_COMMUNITY): Payer: Self-pay

## 2024-01-25 ENCOUNTER — Encounter: Payer: Self-pay | Admitting: Neurology

## 2024-01-25 ENCOUNTER — Ambulatory Visit: Payer: No Typology Code available for payment source | Admitting: Neurology

## 2024-01-25 ENCOUNTER — Other Ambulatory Visit (HOSPITAL_COMMUNITY): Payer: Self-pay

## 2024-01-25 VITALS — BP 119/86 | HR 69 | Ht 64.0 in | Wt 242.0 lb

## 2024-01-25 DIAGNOSIS — R269 Unspecified abnormalities of gait and mobility: Secondary | ICD-10-CM | POA: Diagnosis not present

## 2024-01-25 DIAGNOSIS — R5382 Chronic fatigue, unspecified: Secondary | ICD-10-CM

## 2024-01-25 DIAGNOSIS — G35A Relapsing-remitting multiple sclerosis: Secondary | ICD-10-CM

## 2024-01-25 DIAGNOSIS — Z79899 Other long term (current) drug therapy: Secondary | ICD-10-CM | POA: Diagnosis not present

## 2024-01-25 DIAGNOSIS — G35D Multiple sclerosis, unspecified: Secondary | ICD-10-CM

## 2024-01-25 DIAGNOSIS — G35 Multiple sclerosis: Secondary | ICD-10-CM

## 2024-01-25 DIAGNOSIS — R3915 Urgency of urination: Secondary | ICD-10-CM

## 2024-01-25 DIAGNOSIS — G4733 Obstructive sleep apnea (adult) (pediatric): Secondary | ICD-10-CM

## 2024-01-25 DIAGNOSIS — G4726 Circadian rhythm sleep disorder, shift work type: Secondary | ICD-10-CM

## 2024-01-25 MED ORDER — OXYBUTYNIN CHLORIDE ER 10 MG PO TB24
10.0000 mg | ORAL_TABLET | Freq: Every day | ORAL | 3 refills | Status: DC
  Filled 2024-01-25: qty 90, 90d supply, fill #0

## 2024-01-25 MED ORDER — ARMODAFINIL 250 MG PO TABS
125.0000 mg | ORAL_TABLET | Freq: Every morning | ORAL | 1 refills | Status: DC
  Filled 2024-01-25: qty 90, 90d supply, fill #0
  Filled 2024-04-26: qty 90, 90d supply, fill #1

## 2024-01-25 NOTE — Progress Notes (Addendum)
 [facility]  PATIENT: Courtney Meza DOB: 1986/06/02  REFERRING DOCTOR OR PCP:  Valjean (referred by) is Dr. Rolan;  PCP is Catheryn Slocumb SOURCE: Patient, notes from Dr. Rolan, MRI and lab reports, MRI images 3 onPACS.  _________________________________   HISTORICAL  CHIEF COMPLAINT:  Chief Complaint  Patient presents with   RM10/MS    Pt is here Alone. Pt states that things have been going pretty good with her MS since her last appointment. Pt states that she would like to discuss different treatment options because she would like to have a baby.     HISTORY OF PRESENT ILLNESS:  Courtney Meza is a 38 y.o. woman with RRMS diagnosed early 2018.      Update 01/25/2024: She switched to Briumvi from DMF due to GI side effects.  She had gastric sleeve surgery 11/12/2022 .   Besides the poor tolerability, MRI on 09/06/2022 showed a new lesion in the medulla not present on previous MRIs..  She denies recent exacerbation.  Her gait is doing well.    She will use the bannister for safety but does not absolutely need to. She denies weakness or spasticity.   Her leg or arm occasionally jerks  but less than in the past.    No numbness or dysesthesia  She gets urinary urgency and has oxybutynin  but has not been taking it.    Problems were worse when she was on a diuretic.   She notes vision is stable.  She needs contacts.  No difficulty with colors.    She has fatigue, better with armodafinil  250 mg.  She has a variable schedule/  She works third shift 3-4 days a week  She finds she sleeps extra on days she does not work.    She takes a nap many days (works 7 pm to 7 am 3 days a week) on      She has excessive sleepiness and sometimes dozes off.    Had mild OSA AHI = 7.8 09/2021 - but this was before some weight loss   Mood is fine.  More stress with husbands health issues. Cognition is fine.     Neck pain is worse the past couple weeks..  Some stiffness.   Bothering  her more this week than most of last few months.  Has not needed any more baclofen .   She stopped gabapentin  (leg swelling)   EPWORTH SLEEPINESS SCALE  On a scale of 0 - 3 what is the chance of dozing:  Sitting and Reading:   3 Watching TV:    3 Sitting inactive in a public place: 0 Passenger in car for one hour: 0 Lying down to rest in the afternoon: 3 Sitting and talking to someone: 1 Sitting quietly after lunch:  2 In a car, stopped in traffic:  0  Total (out of 24):    1224  (mild EDS).       MS History:   In April 2006, while driving home she had the sudden onset of reduced right vision like she was looking through a heavy fog.     Vision improved over the next year but not to baseline and has been stable since..   Later in 2006, she had the onset of right facial weakness.  She had two weeks of facial twitching and then had facial weakness that seemed to develop over minutes to one hour.  She was told she had Bell's palsy.  She is uncertain whether she had  complete loss of strength in the upper face.  She had no other major symptoms over the next decade.   However, last year she had numbness in the left arm over a couple weeks The 2006 MRI showed pituitary enlargement.   Prolactin levels were recently elevated and she had a follow-up MRI showing stable pituitary enlargement and a pars intermedius cyst.   On the 09/23/2016 MRI, she had multiple white matter lesions, periventricular > juxtacortical, a left midbrain and right pons lesion.  Brain lesions were not present on the previous MRI.    She has been on Tecfidera  or DMF since 2018.   MRI 06/16/2020 showed 1 focus in the spinal cord not clearly present in 2019.  She opted to remain on Tecfidera .  MRI of the brain 09/06/2022 showed a new lesion in the medulla.  IMAGING The MRI of the brain in October 30, 2004 did not show any brain lesions.   However, the second MRI performed 11/07/2004 appeared to show diffusion weighted hyperintensity in  the right optic nerve as well as enhancement of the right optic nervenot present on the original 10/30/2004 MRI.      The MRI from 09/23/2016 shows multiple T2/FLAIR hyperintense foci in the hemispheres, predominantly in the periventricular white matter but also in the juxtacortical white matter. Additionally there are left midbrain and right pontine lesions.   There is questionable optic nerve enhancement.   MRI brain 06/16/2020 showed T2/FLAIR hyperintense foci, predominantly in the periventricular and juxtacortical white matter in a pattern configuration consistent with chronic demyelinating plaque associated with multiple sclerosis.  None of the foci appear to be acute.  They do not enhance.  Compared to the MRI dated 09/23/2016, there are no new lesions..   No acute findings.  Normal enhancement pattern.  MRI cervical spine 06/16/2020 T2 hyperintense foci at the cervicomedullary junction and within the spinal cord adjacent to C2-C3 and adjacent to C4-C5.  These are consistent with chronic demyelination associated with MS.  The focus at C2-C3 was not present on the 2019 MRI and likely represents interval development of a chronic demyelinating plaque.      Degenerative changes at C4-C5 have progressed and there is now mild to moderate spinal stenosis due to a small central disc herniation.  There is no nerve root compression  MRI of the brain 09/06/2022 showed T2/FLAIR hyperintense foci in the cerebral hemispheres predominantly in the periventricular white matter and an additional focus in the posterior right medulla. Foci do not enhance and are consistent with chronic demyelinating plaque associated with multiple sclerosis. The medullary focus was not clearly present on previous MRI and could represent interval demyelination   MRI of the cervical spine 09/06/2022 showed T2 hyperintense foci within the right posterior medulla and adjacent to C2-C3 and C4-C5 in the spinal cord.  The spinal cord foci are  unchanged compared to the 2021 MRI while the focus in the medulla has occurred since the 2021 MRI.  Disc protrusion at C4-C5 causing mild spinal stenosis.  The size of the disc protrusion has improved compared to the 2021 MRI.  There is no nerve root compression.  REVIEW OF SYSTEMS: Constitutional: No fevers, chills, sweats, or change in appetite.   She notes some fatigue. She sometimes has trouble with sleep. Eyes: as above Ear, nose and throat: No hearing loss, ear pain, nasal congestion, sore throat Cardiovascular: No chest pain, palpitations Respiratory:  No shortness of breath at rest or with exertion.   No wheezes GastrointestinaI:  No nausea, vomiting, diarrhea, abdominal pain, fecal incontinence Genitourinary:  No dysuria, urinary retention or frequency.  No nocturia. Musculoskeletal:  No neck pain, back pain Integumentary: No rash, pruritus, skin lesions Neurological: as above Psychiatric: No depression at this time.  No anxiety Endocrine: No palpitations, diaphoresis, change in appetite, change in weigh or increased thirst Hematologic/Lymphatic:  No anemia, purpura, petechiae. Allergic/Immunologic: No itchy/runny eyes, nasal congestion, recent allergic reactions, rashes  ALLERGIES: Allergies  Allergen Reactions   Latex Hives   Valsartan Itching    HOME MEDICATIONS:  Current Outpatient Medications:    amLODipine  (NORVASC ) 5 MG tablet, Take 1 tablet (5 mg total) by mouth daily., Disp: 30 tablet, Rfl: 11   baclofen  (LIORESAL ) 10 MG tablet, First week or so, take 1/2 tablet by mouth 3 times a day, then take 1 tablet by mouth 3 times a day, Disp: 270 each, Rfl: 1   Cholecalciferol (VITAMIN D3) 5000 units TABS, Take 1 tablet by mouth daily., Disp: , Rfl:    ketoconazole  (NIZORAL ) 2 % cream, Apply 1 Application topically daily., Disp: 60 g, Rfl: 2   sertraline  (ZOLOFT ) 50 MG tablet, Take 1 tablet (50 mg total) by mouth daily., Disp: 90 tablet, Rfl: 3   terconazole  (TERAZOL 7 ) 0.4 %  vaginal cream, Place externally at bedtime for 7 days (Patient not taking: Reported on 04/08/2024), Disp: 45 g, Rfl: 3   tretinoin  (RETIN-A ) 0.025 % cream, Apply pea size amount to the face 2 nights a week on Tu/Th then increase to 3 nights a week in 1 month on M/W/F., Disp: 45 g, Rfl: 1   Ublituximab-xiiy (BRIUMVI) 150 MG/6ML SOLN, Inject into the vein. 6 months, Disp: , Rfl:    Armodafinil  250 MG tablet, Take 1/2 to 1 tablet (125-250 mg total) by mouth every morning., Disp: 90 tablet, Rfl: 1   hydrocortisone  (ANUSOL -HC) 2.5 % rectal cream, Place 1 Application rectally 2 (two) times daily. Use twice daily for the next 7 days, Disp: 30 g, Rfl: 0   linaclotide  (LINZESS ) 290 MCG CAPS capsule, Take 1 capsule (290 mcg total) by mouth daily before breakfast., Disp: 90 capsule, Rfl: 0   metoprolol  tartrate (LOPRESSOR ) 50 MG tablet, Take 1 tablet (50 mg total) by mouth 2 (two) times daily., Disp: 180 tablet, Rfl: 1   omeprazole  (PRILOSEC) 20 MG capsule, Take 1 capsule (20 mg total) by mouth in the morning., Disp: 90 capsule, Rfl: 3   oxybutynin  (DITROPAN  XL) 10 MG 24 hr tablet, Take 1 tablet (10 mg total) by mouth at bedtime. (Patient not taking: Reported on 04/08/2024), Disp: 90 tablet, Rfl: 3   tirzepatide  (ZEPBOUND ) 10 MG/0.5ML Pen, Inject 10 mg into the skin once a week., Disp: 2 mL, Rfl: 1   tirzepatide  (ZEPBOUND ) 10 MG/0.5ML Pen, Inject 10 mg into the skin once a week., Disp: 2 mL, Rfl: 2  PAST MEDICAL HISTORY: Past Medical History:  Diagnosis Date   Allergy    latex   Anxiety    GERD (gastroesophageal reflux disease)    Hypertension    Multiple sclerosis    Neuromuscular disorder (HCC)    Ovarian cyst, left 12/19/2016   Vision abnormalities     PAST SURGICAL HISTORY: Past Surgical History:  Procedure Laterality Date   BARIATRIC SURGERY  12/12/2022   gastric sleeve- [facility]   LAPAROSCOPIC OVARIAN CYSTECTOMY Left 12/19/2016   Procedure: LAPAROSCOPIC LEFT OVARIAN CYSTECTOMY LYSIS OF  ADHESIONS;  Surgeon: Danielle Rom, MD;  Location: [facility];  Service: Gynecology;  Laterality: Left;  WISDOM TOOTH EXTRACTION      FAMILY HISTORY: Family History  Problem Relation Age of Onset   Colon polyps Mother    Colon cancer Mother    Diabetes Mother    Hypertension Mother    Healthy Father    Diabetes Maternal Grandmother    Hypertension Maternal Grandmother    Liver cancer Maternal Grandfather    Esophageal cancer Neg Hx    Stomach cancer Neg Hx    Rectal cancer Neg Hx     SOCIAL HISTORY:  Social History   Socioeconomic History   Marital status: Married    Spouse name: Not on file   Number of children: 0   Years of education: Not on file   Highest education level: Not on file  Occupational History   Occupation: LPN  Tobacco Use   Smoking status: Never   Smokeless tobacco: Never  Vaping Use   Vaping status: Never Used  Substance and Sexual Activity   Alcohol use: No   Drug use: Never   Sexual activity: Yes    Birth control/protection: None  Other Topics Concern   Not on file  Social History Narrative   Not on file   Social Drivers of Health   Financial Resource Strain: Not on file  Food Insecurity: Low Risk  (12/12/2022)   Received from [facility]   Hunger Vital Sign    Within the past 12 months, you worried that your food would run out before you got money to buy more: Never true    Within the past 12 months, the food you bought just didn't last and you didn't have money to get more. : Never true  Transportation Needs: No Transportation Needs (12/12/2022)   Received from [facility]    In the past 12 months, has lack of reliable transportation kept you from medical appointments, meetings, work or from getting things needed for daily living? : No  Physical Activity: Not on file  Stress: Not on file  Social Connections: Unknown (12/07/2021)   [facility]   Social Network    Social Network: Not on file   Intimate Partner Violence: Unknown (11/05/2021)   Received from [facility]   HITS    Physically Hurt: Not on file    Insult or Talk Down To: Not on file    Threaten Physical Harm: Not on file    Scream or Curse: Not on file     PHYSICAL EXAM  Vitals:   01/25/24 1438  BP: 119/86  Pulse: 69  SpO2: 97%  Weight: 242 lb (109.8 kg)  Height: 5' 4 (1.626 m)     Body mass index is 41.54 kg/m.  Neck circ:  16 inches  General: The patient is well-developed and well-nourished and in no acute distress.  Pharynx is Mallampate 0.    Neurologic Exam  Mental status: The patient is alert and oriented x 3 at the time of the examination. The patient has apparent normal recent and remote memory, with an apparently normal attention span and concentration ability.   Speech is normal.  Cranial nerves: Extraocular movements are full.  Color vision is now symmetric.    Colors are symmetric. Facial strength and sensation is normal.  Trapezius strength is normal.     No obvious hearing deficits are noted.  Motor:  Muscle bulk is normal.   Tone is normal. Strength is  5 / 5 in all 4 extremities.   Sensory: She has  normal sensation to touch and vibration in the arms and legs.  Coordination: Cerebellar testing shows good finger-nose-finger bilaterally.  Gait and station: Station is normal.   Gait is normal.  Tandem gait is mildly wide.  Romberg is negative.  Reflexes: Deep tendon reflexes are increased on the left relative to the right.     ASSESSMENT AND PLAN  Relapsing remitting multiple sclerosis  Multiple sclerosis - Plan: IgG, IgA, IgM, CD20 B Cells, CD20 B Cells, IgG, IgA, IgM  Shift work sleep disorder  High risk medication use - Plan: IgG, IgA, IgM, CD20 B Cells, CD20 B Cells, IgG, IgA, IgM  Gait disturbance  Urinary urgency  Chronic fatigue  OSA (obstructive sleep apnea)   1.    Continue Briumvi.  Labs were good at last check next visit will Check IgG/IgM and  CD19/CD20 2.    Continue Nuvigil  for the shift work disorder/EDS/OSA.      3.    Continue vitamin D  supplementation.  4.   Baclofen  prn spasticity 5.    Sertraline  for anxity > depression 6.   She will return to see me in 6 months or sooner if there are new or worsening neurologic symptoms.     Demetrion Wesby A. Vear, MD, PhD 06/12/2024, 12:44 PM Certified in Neurology, Clinical Neurophysiology, Sleep Medicine, Pain Medicine and Neuroimaging  [facility] [address] [place], [place] [address] [phone]

## 2024-01-26 ENCOUNTER — Other Ambulatory Visit: Payer: Self-pay

## 2024-01-30 ENCOUNTER — Encounter: Payer: Self-pay | Admitting: Internal Medicine

## 2024-01-30 NOTE — Patient Instructions (Incomplete)

## 2024-01-30 NOTE — Progress Notes (Deleted)
 I,Muntaha Vermette T Emmitt, CMA,acting as a Neurosurgeon for Catheryn LOISE Slocumb, MD.,have documented all relevant documentation on the behalf of Catheryn LOISE Slocumb, MD,as directed by  Catheryn LOISE Slocumb, MD while in the presence of Catheryn LOISE Slocumb, MD.  Subjective:    Patient ID: Courtney Meza , female    DOB: 03-26-1986 , 38 y.o.   MRN: [id number]  No chief complaint on file.   HPI  HPI   Past Medical History:  Diagnosis Date   Hypertension    Multiple sclerosis (HCC)    Neuromuscular disorder (HCC)    Ovarian cyst, left 12/19/2016   Vision abnormalities      Family History  Problem Relation Age of Onset   Diabetes Mother    Hypertension Mother    Healthy Father    Diabetes Maternal Grandmother    Hypertension Maternal Grandmother    Liver cancer Maternal Grandfather      Current Outpatient Medications:    amLODipine  (NORVASC ) 5 MG tablet, Take 1 tablet (5 mg total) by mouth daily., Disp: 30 tablet, Rfl: 11   Armodafinil  250 MG tablet, Take 1/2 to 1 tablet (125-250 mg total) by mouth every morning., Disp: 90 tablet, Rfl: 1   baclofen  (LIORESAL ) 10 MG tablet, First week or so, take 1/2 tablet by mouth 3 times a day, then take 1 tablet by mouth 3 times a day, Disp: 270 each, Rfl: 1   Cholecalciferol (VITAMIN D3) 5000 units TABS, Take 1 tablet by mouth daily., Disp: , Rfl:    gabapentin  (NEURONTIN ) 300 MG capsule, Take 1 capsule (300 mg total) by mouth 3 (three) times daily. (Patient not taking: Reported on 01/25/2024), Disp: 90 capsule, Rfl: 11   ketoconazole  (NIZORAL ) 2 % cream, Apply 1 Application topically daily., Disp: 60 g, Rfl: 2   linaclotide  (LINZESS ) 145 MCG CAPS capsule, Take 1 capsule (145 mcg total) by mouth daily before breakfast., Disp: 90 capsule, Rfl: 0   metoprolol  tartrate (LOPRESSOR ) 25 MG tablet, TAKE 1 TABLET(25 MG) BY MOUTH TWICE DAILY (Patient taking differently: Take 50 mg by mouth 2 (two) times daily. TAKE 1 TABLET(25 MG) BY MOUTH TWICE DAILY), Disp: 180 tablet, Rfl: 1    oxybutynin  (DITROPAN  XL) 10 MG 24 hr tablet, Take 1 tablet (10 mg total) by mouth at bedtime., Disp: 90 tablet, Rfl: 3   sertraline  (ZOLOFT ) 50 MG tablet, Take 1 tablet (50 mg total) by mouth daily., Disp: 90 tablet, Rfl: 3   terbinafine  (LAMISIL ) 250 MG tablet, Take 1 tablet (250 mg total) by mouth daily., Disp: 30 tablet, Rfl: 2   terconazole  (TERAZOL 7 ) 0.4 % vaginal cream, Place externally at bedtime for 7 days, Disp: 45 g, Rfl: 3   tirzepatide  (ZEPBOUND ) 7.5 MG/0.5ML Pen, Inject 7.5 mg into the skin once a week. (Patient not taking: Reported on 01/25/2024), Disp: 2 mL, Rfl: 2   tretinoin  (RETIN-A ) 0.025 % cream, Apply pea size amount to the face 2 nights a week on Tu/Th then increase to 3 nights a week in 1 month on M/W/F., Disp: 45 g, Rfl: 1   Ublituximab-xiiy (BRIUMVI) 150 MG/6ML SOLN, Inject into the vein. 6 months, Disp: , Rfl:    ZEPBOUND  2.5 MG/0.5ML Pen, ADMINISTER 2.5 MG UNDER THE SKIN EVERY 7 DAYS, Disp: , Rfl:    Allergies  Allergen Reactions   Latex Hives   Valsartan Itching      The patient states she uses {contraceptive methods:5051} for birth control. No LMP recorded.. {Dysmenorrhea-menorrhagia:21918}. Negative for: breast discharge, breast lump(s),  breast pain and breast self exam. Associated symptoms include abnormal vaginal bleeding. Pertinent negatives include abnormal bleeding (hematology), anxiety, decreased libido, depression, difficulty falling sleep, dyspareunia, history of infertility, nocturia, sexual dysfunction, sleep disturbances, urinary incontinence, urinary urgency, vaginal discharge and vaginal itching. Diet regular.The patient states her exercise level is    . The patient's tobacco use is:  Social History   Tobacco Use  Smoking Status Never  Smokeless Tobacco Never  . She has been exposed to passive smoke. The patient's alcohol use is:  Social History   Substance and Sexual Activity  Alcohol Use No  . Additional information: Last pap ***, next one  scheduled for ***.    Review of Systems  Constitutional: Negative.   HENT: Negative.    Eyes: Negative.   Respiratory: Negative.    Cardiovascular: Negative.   Gastrointestinal: Negative.   Endocrine: Negative.   Genitourinary: Negative.   Musculoskeletal: Negative.   Skin: Negative.   Allergic/Immunologic: Negative.   Neurological: Negative.   Hematological: Negative.   Psychiatric/Behavioral: Negative.       There were no vitals filed for this visit. There is no height or weight on file to calculate BMI.  Wt Readings from Last 3 Encounters:  01/25/24 242 lb (109.8 kg)  01/18/24 238 lb (108 kg)  11/02/23 249 lb (112.9 kg)     Objective:  Physical Exam      Assessment And Plan:     Encounter for annual health examination  Essential hypertension, benign  Other abnormal glucose  Multiple sclerosis (HCC)  Family history of colon cancer in mother     No follow-ups on file. Patient was given opportunity to ask questions. Patient verbalized understanding of the plan and was able to repeat key elements of the plan. All questions were answered to their satisfaction.   Catheryn LOISE Slocumb, MD  I, Catheryn LOISE Slocumb, MD, have reviewed all documentation for this visit. The documentation on 01/30/24 for the exam, diagnosis, procedures, and orders are all accurate and complete.

## 2024-02-05 ENCOUNTER — Other Ambulatory Visit

## 2024-02-05 ENCOUNTER — Telehealth: Payer: Self-pay | Admitting: *Deleted

## 2024-02-05 NOTE — Telephone Encounter (Signed)
 Per [facility]: I spoke with pt on 7/1 and told her she needed to get her labs done ASAP so I can submit for a PA and she still has yet to get them collected  I called LVM for pt to call back to schedule lab appt

## 2024-02-07 NOTE — Telephone Encounter (Signed)
 Called pt at [phone]. LVM for her to call office.

## 2024-02-08 ENCOUNTER — Other Ambulatory Visit (INDEPENDENT_AMBULATORY_CARE_PROVIDER_SITE_OTHER): Payer: Self-pay

## 2024-02-08 DIAGNOSIS — Z0289 Encounter for other administrative examinations: Secondary | ICD-10-CM

## 2024-02-08 NOTE — Telephone Encounter (Signed)
 Pt came to complete labs today. CD20 only drawn Mon-Wed. She r/s to come back 02/12/24 to do labs. [facility] made aware.

## 2024-02-12 ENCOUNTER — Other Ambulatory Visit (INDEPENDENT_AMBULATORY_CARE_PROVIDER_SITE_OTHER): Payer: Self-pay

## 2024-02-12 DIAGNOSIS — Z0289 Encounter for other administrative examinations: Secondary | ICD-10-CM

## 2024-02-13 ENCOUNTER — Encounter: Payer: Self-pay | Admitting: Internal Medicine

## 2024-02-13 LAB — IGG, IGA, IGM
IgA/Immunoglobulin A, Serum: 197 mg/dL (ref 87–352)
IgG (Immunoglobin G), Serum: 1691 mg/dL — ABNORMAL HIGH (ref 586–1602)
IgM (Immunoglobulin M), Srm: 26 mg/dL (ref 26–217)

## 2024-02-14 ENCOUNTER — Ambulatory Visit: Payer: Self-pay | Admitting: Neurology

## 2024-02-14 LAB — CD20 B CELLS
% CD19-B Cells: 1 % — ABNORMAL LOW (ref 4.6–22.1)
% CD20-B Cells: 1 % — ABNORMAL LOW (ref 5.0–22.3)

## 2024-02-19 ENCOUNTER — Other Ambulatory Visit (HOSPITAL_COMMUNITY): Payer: Self-pay

## 2024-02-19 ENCOUNTER — Encounter: Payer: Self-pay | Admitting: Internal Medicine

## 2024-02-19 ENCOUNTER — Ambulatory Visit (AMBULATORY_SURGERY_CENTER): Admitting: Internal Medicine

## 2024-02-19 VITALS — BP 138/99 | HR 74 | Temp 97.4°F | Resp 14 | Ht 64.0 in | Wt 238.0 lb

## 2024-02-19 DIAGNOSIS — K5730 Diverticulosis of large intestine without perforation or abscess without bleeding: Secondary | ICD-10-CM | POA: Diagnosis not present

## 2024-02-19 DIAGNOSIS — Z1211 Encounter for screening for malignant neoplasm of colon: Secondary | ICD-10-CM

## 2024-02-19 DIAGNOSIS — Z800 Family history of malignant neoplasm of digestive organs: Secondary | ICD-10-CM

## 2024-02-19 DIAGNOSIS — D122 Benign neoplasm of ascending colon: Secondary | ICD-10-CM

## 2024-02-19 DIAGNOSIS — K625 Hemorrhage of anus and rectum: Secondary | ICD-10-CM

## 2024-02-19 DIAGNOSIS — K648 Other hemorrhoids: Secondary | ICD-10-CM

## 2024-02-19 MED ORDER — HYDROCORTISONE (PERIANAL) 2.5 % EX CREA
1.0000 | TOPICAL_CREAM | Freq: Two times a day (BID) | CUTANEOUS | 0 refills | Status: AC
  Filled 2024-02-19: qty 30, 7d supply, fill #0

## 2024-02-19 MED ORDER — SODIUM CHLORIDE 0.9 % IV SOLN: 500.0000 mL | Freq: Once | INTRAVENOUS | Status: DC

## 2024-02-19 NOTE — Patient Instructions (Signed)
 Resume previous diet and medications. Awaiting pathology results. Use Anusol  HC cream BID for the next 7 days If this is not effective, then can consider hemorrhoidal banding.  YOU HAD AN ENDOSCOPIC PROCEDURE TODAY AT THE [facility]:   Refer to the procedure report that was given to you for any specific questions about what was found during the examination.  If the procedure report does not answer your questions, please call your gastroenterologist to clarify.  If you requested that your care partner not be given the details of your procedure findings, then the procedure report has been included in a sealed envelope for you to review at your convenience later.  YOU SHOULD EXPECT: Some feelings of bloating in the abdomen. Passage of more gas than usual.  Walking can help get rid of the air that was put into your GI tract during the procedure and reduce the bloating. If you had a lower endoscopy (such as a colonoscopy or flexible sigmoidoscopy) you may notice spotting of blood in your stool or on the toilet paper. If you underwent a bowel prep for your procedure, you may not have a normal bowel movement for a few days.  Please Note:  You might notice some irritation and congestion in your nose or some drainage.  This is from the oxygen used during your procedure.  There is no need for concern and it should clear up in a day or so.  SYMPTOMS TO REPORT IMMEDIATELY:  Following lower endoscopy (colonoscopy or flexible sigmoidoscopy):  Excessive amounts of blood in the stool  Significant tenderness or worsening of abdominal pains  Swelling of the abdomen that is new, acute  Fever of 100F or higher  For urgent or emergent issues, a gastroenterologist can be reached at any hour by calling [phone]. Do not use MyChart messaging for urgent concerns.    DIET:  We do recommend a small meal at first, but then you may proceed to your regular diet.  Drink plenty of fluids but you should  avoid alcoholic beverages for 24 hours.  ACTIVITY:  You should plan to take it easy for the rest of today and you should NOT DRIVE or use heavy machinery until tomorrow (because of the sedation medicines used during the test).    FOLLOW UP: Our staff will call the number listed on your records the next business day following your procedure.  We will call around 7:15- 8:00 am to check on you and address any questions or concerns that you may have regarding the information given to you following your procedure. If we do not reach you, we will leave a message.     If any biopsies were taken you will be contacted by phone or by letter within the next 1-3 weeks.  Please call us  at [phone] if you have not heard about the biopsies in 3 weeks.    SIGNATURES/CONFIDENTIALITY: You and/or your care partner have signed paperwork which will be entered into your electronic medical record.  These signatures attest to the fact that that the information above on your After Visit Summary has been reviewed and is understood.  Full responsibility of the confidentiality of this discharge information lies with you and/or your care-partner.

## 2024-02-19 NOTE — Progress Notes (Unsigned)
 Called to room to assist during endoscopic procedure.  Patient ID and intended procedure confirmed with present staff. Received instructions for my participation in the procedure from the performing physician.

## 2024-02-19 NOTE — Progress Notes (Unsigned)
 Updated medical record.

## 2024-02-19 NOTE — Progress Notes (Unsigned)
 To pacu, VSS. Report to [email or web address].tb

## 2024-02-19 NOTE — Op Note (Signed)
 [facility] Patient Name: Courtney Meza Procedure Date: 02/19/2024 3:14 PM MRN: [id number] Endoscopist: Rosario Estefana Kidney , , [id number] Age: 38 Referring MD:  Date of Birth: 10-07-1985 Gender: Female Account #: [id number] Procedure:                Colonoscopy Indications:              Screening in patient at increased risk: Family                            history of 1st-degree relative with colorectal                            cancer before age 51 years Medicines:                Monitored Anesthesia Care Procedure:                Pre-Anesthesia Assessment:                           - Prior to the procedure, a History and Physical                            was performed, and patient medications and                            allergies were reviewed. The patient's tolerance of                            previous anesthesia was also reviewed. The risks                            and benefits of the procedure and the sedation                            options and risks were discussed with the patient.                            All questions were answered, and informed consent                            was obtained. Prior Anticoagulants: The patient has                            taken no anticoagulant or antiplatelet agents. ASA                            Grade Assessment: II - A patient with mild systemic                            disease. After reviewing the risks and benefits,                            the patient was deemed in satisfactory condition to  undergo the procedure.                           After obtaining informed consent, the colonoscope                            was passed under direct vision. Throughout the                            procedure, the patient's blood pressure, pulse, and                            oxygen saturations were monitored continuously. The                            Olympus Scope [id number]  was introduced through the                            anus and advanced to the the terminal ileum. The                            colonoscopy was performed without difficulty. The                            patient tolerated the procedure well. The quality                            of the bowel preparation was excellent. The                            terminal ileum, ileocecal valve, appendiceal                            orifice, and rectum were photographed. Scope In: 3:37:08 PM Scope Out: 3:51:04 PM Scope Withdrawal Time: 0 hours 11 minutes 42 seconds  Total Procedure Duration: 0 hours 13 minutes 56 seconds  Findings:                 The terminal ileum appeared normal.                           A 4 mm polyp was found in the ascending colon. The                            polyp was sessile. The polyp was removed with a                            cold snare. Resection and retrieval were complete.                           A few diverticula were found in the sigmoid colon.                           Non-bleeding internal hemorrhoids were found during  retroflexion. Complications:            No immediate complications. Estimated Blood Loss:     Estimated blood loss was minimal. Impression:               - The examined portion of the ileum was normal.                           - One 4 mm polyp in the ascending colon, removed                            with a cold snare. Resected and retrieved.                           - Diverticulosis in the sigmoid colon.                           - Non-bleeding internal hemorrhoids. Recommendation:           - Discharge patient to home (with escort).                           - Await pathology results.                           - Anusol  HC cream BID for 7 days. If this is not                            effective, then can consider hemorrhoidal banding.                           - The findings and recommendations were discussed                             with the patient. Dr Estefana Federico Rosario Estefana Federico,  02/19/2024 3:54:10 PM

## 2024-02-19 NOTE — Progress Notes (Unsigned)
 GASTROENTEROLOGY PROCEDURE H&P NOTE   Primary Care Physician: Jarold Medici, MD    Reason for Procedure:   Family history of colon cancer, rectal bleeding  Plan:    Colonoscopy  Patient is appropriate for endoscopic procedure(s) in the ambulatory ([facility]) setting.  The nature of the procedure, as well as the risks, benefits, and alternatives were carefully and thoroughly reviewed with the patient. Ample time for discussion and questions allowed. The patient understood, was satisfied, and agreed to proceed.     HPI: Courtney Meza is a 38 y.o. female who presents for colonoscopy for evaluation of family history of colon cancer, rectal bleeding.  Patient was most recently seen in the Gastroenterology Clinic on 01/18/24.  No interval change in medical history since that appointment. Please refer to that note for full details regarding GI history and clinical presentation.   Past Medical History:  Diagnosis Date   Allergy    latex   Anxiety    GERD (gastroesophageal reflux disease)    Hypertension    Multiple sclerosis (HCC)    Neuromuscular disorder (HCC)    Ovarian cyst, left 12/19/2016   Vision abnormalities     Past Surgical History:  Procedure Laterality Date   BARIATRIC SURGERY  12/12/2022   gastric sleeve- [facility]   LAPAROSCOPIC OVARIAN CYSTECTOMY Left 12/19/2016   Procedure: LAPAROSCOPIC LEFT OVARIAN CYSTECTOMY LYSIS OF ADHESIONS;  Surgeon: Danielle Rom, MD;  Location: [facility];  Service: Gynecology;  Laterality: Left;   WISDOM TOOTH EXTRACTION      Prior to Admission medications   Medication Sig Start Date End Date Taking? Authorizing Provider  amLODipine  (NORVASC ) 5 MG tablet Take 1 tablet (5 mg total) by mouth daily. 09/26/23 09/25/24  Jarold Medici, MD  Armodafinil  250 MG tablet Take 1/2 to 1 tablet (125-250 mg total) by mouth every morning. 01/25/24   Sater, Charlie LABOR, MD  baclofen  (LIORESAL ) 10 MG tablet First week or so, take 1/2 tablet by mouth 3  times a day, then take 1 tablet by mouth 3 times a day 02/10/22   Sater, Charlie LABOR, MD  Cholecalciferol (VITAMIN D3) 5000 units TABS Take 1 tablet by mouth daily.    [provider]  gabapentin  (NEURONTIN ) 300 MG capsule Take 1 capsule (300 mg total) by mouth 3 (three) times daily. Patient not taking: Reported on 01/25/2024 08/18/22   Lomax, Amy, NP  ketoconazole  (NIZORAL ) 2 % cream Apply 1 Application topically daily. 11/15/23   Sikora, Rebecca, DPM  linaclotide  (LINZESS ) 145 MCG CAPS capsule Take 1 capsule (145 mcg total) by mouth daily before breakfast. 02/14/23   Jarold Medici, MD  metoprolol  tartrate (LOPRESSOR ) 25 MG tablet TAKE 1 TABLET(25 MG) BY MOUTH TWICE DAILY Patient taking differently: Take 50 mg by mouth 2 (two) times daily. TAKE 1 TABLET(25 MG) BY MOUTH TWICE DAILY 10/02/23   Jarold Medici, MD  oxybutynin  (DITROPAN  XL) 10 MG 24 hr tablet Take 1 tablet (10 mg total) by mouth at bedtime. 01/25/24   Sater, Charlie LABOR, MD  sertraline  (ZOLOFT ) 50 MG tablet Take 1 tablet (50 mg total) by mouth daily. 07/12/23   Sater, Charlie LABOR, MD  terbinafine  (LAMISIL ) 250 MG tablet Take 1 tablet (250 mg total) by mouth daily. 01/03/24 04/02/24  Sikora, Rebecca, DPM  terconazole  (TERAZOL 7 ) 0.4 % vaginal cream Place externally at bedtime for 7 days 12/20/23     tirzepatide  (ZEPBOUND ) 7.5 MG/0.5ML Pen Inject 7.5 mg into the skin once a week. Patient not taking: Reported on 01/25/2024 01/11/24  tretinoin  (RETIN-A ) 0.025 % cream Apply pea size amount to the face 2 nights a week on Tu/Th then increase to 3 nights a week in 1 month on M/W/F. 12/12/23   Alm Delon SAILOR, DO  Ublituximab-xiiy (BRIUMVI) 150 MG/6ML SOLN Inject into the vein. 6 months    [provider]  ZEPBOUND  2.5 MG/0.5ML Pen ADMINISTER 2.5 MG UNDER THE SKIN EVERY 7 DAYS 09/29/23   [provider]    Current Outpatient Medications  Medication Sig Dispense Refill   amLODipine  (NORVASC ) 5 MG tablet Take 1 tablet (5 mg total) by  mouth daily. 30 tablet 11   Armodafinil  250 MG tablet Take 1/2 to 1 tablet (125-250 mg total) by mouth every morning. 90 tablet 1   Cholecalciferol (VITAMIN D3) 5000 units TABS Take 1 tablet by mouth daily.     ketoconazole  (NIZORAL ) 2 % cream Apply 1 Application topically daily. 60 g 2   metoprolol  tartrate (LOPRESSOR ) 25 MG tablet TAKE 1 TABLET(25 MG) BY MOUTH TWICE DAILY (Patient taking differently: Take 50 mg by mouth 2 (two) times daily. TAKE 1 TABLET(25 MG) BY MOUTH TWICE DAILY) 180 tablet 1   sertraline  (ZOLOFT ) 50 MG tablet Take 1 tablet (50 mg total) by mouth daily. 90 tablet 3   terbinafine  (LAMISIL ) 250 MG tablet Take 1 tablet (250 mg total) by mouth daily. 30 tablet 2   tretinoin  (RETIN-A ) 0.025 % cream Apply pea size amount to the face 2 nights a week on Tu/Th then increase to 3 nights a week in 1 month on M/W/F. 45 g 1   baclofen  (LIORESAL ) 10 MG tablet First week or so, take 1/2 tablet by mouth 3 times a day, then take 1 tablet by mouth 3 times a day 270 each 1   linaclotide  (LINZESS ) 145 MCG CAPS capsule Take 1 capsule (145 mcg total) by mouth daily before breakfast. 90 capsule 0   oxybutynin  (DITROPAN  XL) 10 MG 24 hr tablet Take 1 tablet (10 mg total) by mouth at bedtime. (Patient not taking: Reported on 02/19/2024) 90 tablet 3   terconazole  (TERAZOL 7 ) 0.4 % vaginal cream Place externally at bedtime for 7 days 45 g 3   tirzepatide  (ZEPBOUND ) 7.5 MG/0.5ML Pen Inject 7.5 mg into the skin once a week. (Patient not taking: No sig reported) 2 mL 2   Ublituximab-xiiy (BRIUMVI) 150 MG/6ML SOLN Inject into the vein. 6 months     Current Facility-Administered Medications  Medication Dose Route Frequency Provider Last Rate Last Admin   0.9 %  sodium chloride  infusion  500 mL Intravenous Once Federico Rosario BROCKS, MD        Allergies as of 02/19/2024 - Review Complete 02/19/2024  Allergen Reaction Noted   Latex Hives 04/16/2013   Valsartan Itching 08/18/2022    Family History  Problem  Relation Age of Onset   Colon polyps Mother    Colon cancer Mother    Diabetes Mother    Hypertension Mother    Healthy Father    Diabetes Maternal Grandmother    Hypertension Maternal Grandmother    Liver cancer Maternal Grandfather    Esophageal cancer Neg Hx    Stomach cancer Neg Hx    Rectal cancer Neg Hx     Social History   Socioeconomic History   Marital status: Married    Spouse name: Not on file   Number of children: 0   Years of education: Not on file   Highest education level: Not on file  Occupational History  Occupation: LPN  Tobacco Use   Smoking status: Never   Smokeless tobacco: Never  Vaping Use   Vaping status: Never Used  Substance and Sexual Activity   Alcohol use: No   Drug use: Never   Sexual activity: Yes    Birth control/protection: None  Other Topics Concern   Not on file  Social History Narrative   Not on file   Social Drivers of Health   Financial Resource Strain: Not on file  Food Insecurity: Low Risk  (12/12/2022)   Received from [facility]   Hunger Vital Sign    Within the past 12 months, you worried that your food would run out before you got money to buy more: Never true    Within the past 12 months, the food you bought just didn't last and you didn't have money to get more. : Never true  Transportation Needs: No Transportation Needs (12/12/2022)   Received from [facility]    In the past 12 months, has lack of reliable transportation kept you from medical appointments, meetings, work or from getting things needed for daily living? : No  Physical Activity: Not on file  Stress: Not on file  Social Connections: Unknown (12/07/2021)   Received from [facility]   Social Network    Social Network: Not on file  Intimate Partner Violence: Unknown (11/05/2021)   Received from [facility]   HITS    Physically Hurt: Not on file    Insult or Talk Down To: Not on file    Threaten Physical Harm: Not on file     Scream or Curse: Not on file    Physical Exam: Vital signs in last 24 hours: BP 130/88   Pulse 70   Temp (!) 97.4 F (36.3 C) (Temporal)   Ht 5' 4 (1.626 m)   Wt 238 lb (108 kg)   LMP 02/12/2024   SpO2 100%   BMI 40.85 kg/m  GEN: NAD EYE: Sclerae anicteric ENT: MMM CV: Non-tachycardic Pulm: No increased WOB GI: Soft NEURO:  Alert & Oriented   Estefana Kidney, MD [facility]   02/19/2024 3:30 PM

## 2024-02-20 ENCOUNTER — Telehealth: Payer: Self-pay | Admitting: *Deleted

## 2024-02-20 NOTE — Telephone Encounter (Signed)
  Follow up Call-     02/19/2024    3:11 PM  Call back number  Post procedure Call Back phone  # [phone]  Permission to leave phone message Yes     Patient questions:  Do you have a fever, pain , or abdominal swelling? No. Pain Score  0 *  Have you tolerated food without any problems? Yes.    Have you been able to return to your normal activities? Yes.    Do you have any questions about your discharge instructions: Diet   No. Medications  No. Follow up visit  No.  Do you have questions or concerns about your Care? No.  Actions: * If pain score is 4 or above: No action needed, pain <4.

## 2024-02-22 ENCOUNTER — Ambulatory Visit: Payer: Self-pay | Admitting: Internal Medicine

## 2024-02-22 LAB — SURGICAL PATHOLOGY

## 2024-02-28 ENCOUNTER — Other Ambulatory Visit (HOSPITAL_COMMUNITY): Payer: Self-pay

## 2024-03-10 ENCOUNTER — Encounter: Payer: Self-pay | Admitting: Internal Medicine

## 2024-03-12 ENCOUNTER — Encounter: Payer: Self-pay | Admitting: Internal Medicine

## 2024-03-12 ENCOUNTER — Other Ambulatory Visit: Payer: Self-pay | Admitting: Physician Assistant

## 2024-03-12 ENCOUNTER — Telehealth: Payer: Self-pay | Admitting: Physician Assistant

## 2024-03-12 ENCOUNTER — Ambulatory Visit: Payer: Self-pay | Admitting: Internal Medicine

## 2024-03-12 ENCOUNTER — Other Ambulatory Visit (HOSPITAL_COMMUNITY): Payer: Self-pay

## 2024-03-12 ENCOUNTER — Other Ambulatory Visit (HOSPITAL_BASED_OUTPATIENT_CLINIC_OR_DEPARTMENT_OTHER): Payer: Self-pay

## 2024-03-12 VITALS — BP 118/78 | HR 84 | Temp 98.4°F | Ht 64.0 in | Wt 236.8 lb

## 2024-03-12 DIAGNOSIS — K5904 Chronic idiopathic constipation: Secondary | ICD-10-CM

## 2024-03-12 DIAGNOSIS — I10 Essential (primary) hypertension: Secondary | ICD-10-CM

## 2024-03-12 DIAGNOSIS — R7989 Other specified abnormal findings of blood chemistry: Secondary | ICD-10-CM

## 2024-03-12 DIAGNOSIS — R7309 Other abnormal glucose: Secondary | ICD-10-CM | POA: Diagnosis not present

## 2024-03-12 DIAGNOSIS — E66813 Obesity, class 3: Secondary | ICD-10-CM

## 2024-03-12 DIAGNOSIS — Z6841 Body Mass Index (BMI) 40.0 and over, adult: Secondary | ICD-10-CM

## 2024-03-12 DIAGNOSIS — G35 Multiple sclerosis: Secondary | ICD-10-CM

## 2024-03-12 MED ORDER — ZEPBOUND 10 MG/0.5ML ~~LOC~~ SOAJ
10.0000 mg | SUBCUTANEOUS | 1 refills | Status: AC
  Filled 2024-03-12: qty 2, 28d supply, fill #0
  Filled 2024-04-26 – 2024-05-03 (×2): qty 2, 28d supply, fill #1

## 2024-03-12 MED ORDER — LINACLOTIDE 290 MCG PO CAPS
290.0000 ug | ORAL_CAPSULE | Freq: Every day | ORAL | 0 refills | Status: DC
  Filled 2024-03-12: qty 90, 90d supply, fill #0

## 2024-03-12 MED ORDER — METOPROLOL TARTRATE 50 MG PO TABS
50.0000 mg | ORAL_TABLET | Freq: Every day | ORAL | 2 refills | Status: DC
  Filled 2024-03-12: qty 90, 90d supply, fill #0

## 2024-03-12 MED ORDER — METOPROLOL TARTRATE 50 MG PO TABS
50.0000 mg | ORAL_TABLET | Freq: Two times a day (BID) | ORAL | 1 refills | Status: AC
Start: 2024-03-12 — End: 2025-03-12
  Filled 2024-03-12: qty 180, 90d supply, fill #0
  Filled 2024-06-13: qty 180, 90d supply, fill #1

## 2024-03-12 MED ORDER — LUBIPROSTONE 24 MCG PO CAPS
24.0000 ug | ORAL_CAPSULE | Freq: Two times a day (BID) | ORAL | 0 refills | Status: DC
  Filled 2024-03-12: qty 60, 30d supply, fill #0

## 2024-03-12 MED ORDER — IBSRELA 50 MG PO TABS
50.0000 mg | ORAL_TABLET | Freq: Two times a day (BID) | ORAL | 3 refills | Status: DC
  Filled 2024-03-12: qty 60, 30d supply, fill #0

## 2024-03-12 NOTE — Progress Notes (Signed)
 I,Courtney Meza, CMA,acting as a Neurosurgeon for Courtney LOISE Slocumb, MD.,have documented all relevant documentation on the behalf of Courtney LOISE Slocumb, MD,as directed by  Courtney LOISE Slocumb, MD while in the presence of Courtney LOISE Slocumb, MD.  Subjective:  Patient ID: Courtney Meza , female    DOB: [birth year] , 38 y.o.   MRN: [id number]  Chief Complaint  Patient presents with   Hypertension    Patient presents today for bpc. She reports compliance with medications. Denies headache, chest pain & sob. She states noticing the amlodipine  has made both of her legs swell along with feeling fatigue. She would like to change medication due to side effects.     HPI Discussed the use of AI scribe software for clinical note transcription with the patient, who gave verbal consent to proceed.  History of Present Illness Courtney Meza is a 38 year old female with hypertension and prediabetes who presents for a blood pressure check and medication management.  She is currently taking metoprolol , previously at a dose of 25 mg twice daily, now increased to 50 mg twice daily. No missed doses. She also takes amlodipine  but experiences sleepiness and leg swelling, leading her to adjust her intake to half a pill after work to manage drowsiness.  She is on Zepbound  7.5 mg weekly for weight management and prediabetes, with follow-up appointments every three months. She is followed at [facility].  She wants to increase the dose to 10 mg. She does not follow a specific meal plan and has an upcoming online visit for further management.  She works night shifts and sleeps during the day. She last ate at 8:30 AM after her shift ended at 7:00 AM. She has not engaged in much physical activity recently.  She had a colonoscopy recently, which revealed a polyp and internal hemorrhoids. She occasionally notices blood in her stool.  She has received the initial COVID-19 vaccinations but has not had any boosters since. 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   Hypertension This is a chronic problem. The current episode started more than 1 year ago. The problem has been waxing and waning since onset. The problem is controlled. Pertinent negatives include no anxiety. There are no associated agents to hypertension. Risk factors for coronary artery disease include obesity and sedentary lifestyle. Past treatments include diuretics and beta blockers. There are no compliance problems.  There is no history of angina. There is no history of chronic renal disease.     Past Medical History:  Diagnosis Date   Allergy    latex   Anxiety    GERD (gastroesophageal reflux disease)    Hypertension    Multiple sclerosis (HCC)    Neuromuscular disorder (HCC)    Ovarian cyst, left 12/19/2016   Vision abnormalities      Family History  Problem Relation Age of Onset   Colon polyps Mother    Colon cancer Mother    Diabetes Mother    Hypertension Mother    Healthy Father    Diabetes Maternal Grandmother    Hypertension Maternal Grandmother    Liver cancer Maternal Grandfather    Esophageal cancer Neg Hx    Stomach cancer Neg Hx    Rectal cancer Neg Hx      Current Outpatient Medications:    amLODipine  (NORVASC ) 5 MG tablet, Take 1 tablet (5 mg total) by mouth daily., Disp: 30 tablet, Rfl: 11   Armodafinil  250 MG tablet, Take 1/2  to 1 tablet (125-250 mg total) by mouth every morning., Disp: 90 tablet, Rfl: 1   baclofen  (LIORESAL ) 10 MG tablet, First week or so, take 1/2 tablet by mouth 3 times a day, then take 1 tablet by mouth 3 times a day, Disp: 270 each, Rfl: 1   Cholecalciferol (VITAMIN D3) 5000 units TABS, Take 1 tablet by mouth daily., Disp: , Rfl:    hydrocortisone  (ANUSOL -HC) 2.5 % rectal cream, Place 1 Application rectally 2 (two) times daily. Use twice daily for the next 7 days, Disp: 30 g, Rfl: 0   ketoconazole  (NIZORAL ) 2 % cream, Apply 1 Application topically daily., Disp: 60  g, Rfl: 2   metoprolol  tartrate (LOPRESSOR ) 50 MG tablet, Take 1 tablet (50 mg total) by mouth 2 (two) times daily., Disp: 180 tablet, Rfl: 1   sertraline  (ZOLOFT ) 50 MG tablet, Take 1 tablet (50 mg total) by mouth daily., Disp: 90 tablet, Rfl: 3   terbinafine  (LAMISIL ) 250 MG tablet, Take 1 tablet (250 mg total) by mouth daily., Disp: 30 tablet, Rfl: 2   tirzepatide  (ZEPBOUND ) 10 MG/0.5ML Pen, Inject 10 mg into the skin once a week., Disp: 2 mL, Rfl: 1   tretinoin  (RETIN-A ) 0.025 % cream, Apply pea size amount to the face 2 nights a week on Tu/Th then increase to 3 nights a week in 1 month on M/W/F., Disp: 45 g, Rfl: 1   Ublituximab-xiiy (BRIUMVI) 150 MG/6ML SOLN, Inject into the vein. 6 months, Disp: , Rfl:    linaclotide  (LINZESS ) 290 MCG CAPS capsule, Take 1 capsule (290 mcg total) by mouth daily before breakfast., Disp: 90 capsule, Rfl: 0   oxybutynin  (DITROPAN  XL) 10 MG 24 hr tablet, Take 1 tablet (10 mg total) by mouth at bedtime. (Patient not taking: Reported on 03/12/2024), Disp: 90 tablet, Rfl: 3   terconazole  (TERAZOL 7 ) 0.4 % vaginal cream, Place externally at bedtime for 7 days (Patient not taking: Reported on 03/12/2024), Disp: 45 g, Rfl: 3   Allergies  Allergen Reactions   Latex Hives   Valsartan Itching     Review of Systems  Constitutional: Negative.   Respiratory: Negative.    Cardiovascular: Negative.   Gastrointestinal: Negative.   Neurological: Negative.   Psychiatric/Behavioral: Negative.       Today's Vitals   03/12/24 1631  BP: 118/78  Pulse: 84  Temp: 98.4 F (36.9 C)  SpO2: 98%  Weight: 236 lb 12.8 oz (107.4 kg)  Height: 5' 4 (1.626 m)   Body mass index is 40.65 kg/m.  Wt Readings from Last 3 Encounters:  03/12/24 236 lb 12.8 oz (107.4 kg)  02/19/24 238 lb (108 kg)  01/25/24 242 lb (109.8 kg)     Objective:  Physical Exam Vitals and nursing note reviewed.  Constitutional:      Appearance: Normal appearance. She is obese.  HENT:     Head:  Normocephalic and atraumatic.  Eyes:     Extraocular Movements: Extraocular movements intact.  Cardiovascular:     Rate and Rhythm: Normal rate and regular rhythm.     Heart sounds: Normal heart sounds.  Pulmonary:     Effort: Pulmonary effort is normal.     Breath sounds: Normal breath sounds.  Musculoskeletal:     Cervical back: Normal range of motion.  Skin:    General: Skin is warm.  Neurological:     General: No focal deficit present.     Mental Status: She is alert.  Psychiatric:  Mood and Affect: Mood normal.        Behavior: Behavior normal.         Assessment And Plan:  Essential hypertension, benign Assessment & Plan: Hypertension controlled with metoprolol  and amlodipine . Insurance issues with metoprolol  dosage. Amlodipine  causing sleepiness and leg swelling. - Continue metoprolol  50 mg twice daily. - Prescribe amlodipine  at half the current dose. - Advise taking amlodipine  after work. - Refill metoprolol  prescription. - Follow low sodium diet  Orders: -     CBC -     CMP14+EGFR -     Lipid panel  Other abnormal glucose Assessment & Plan: Previous labs reviewed, her A1c has been elevated in the past. I will check an A1c today. Reminded to avoid refined sugars including sugary drinks/foods and processed meats including bacon, sausages and deli meats.    Orders: -     CMP14+EGFR -     Lipid panel  Multiple sclerosis (HCC) Assessment & Plan: Chronic, most recent Neuro notes reviewed. She will continue with Armodafanil, baclofen  and Briumvi as per Neuro.    Class 3 severe obesity due to excess calories with body mass index (BMI) of 40.0 to 44.9 in adult Assessment & Plan: BMI 40.  She was congratulated on her continued weight loss. She is currently on Zepbound  7.5 mg for weight management. - Increase Zepbound  to 10mg  weekly - Optimize protein intake, incorporate strength training into exercise routine    Other orders -     Zepbound ; Inject 10  mg into the skin once a week.  Dispense: 2 mL; Refill: 1 -     Metoprolol  Tartrate; Take 1 tablet (50 mg total) by mouth 2 (two) times daily.  Dispense: 180 tablet; Refill: 1   Return if symptoms worsen or fail to improve.  Patient was given opportunity to ask questions. Patient verbalized understanding of the plan and was able to repeat key elements of the plan. All questions were answered to their satisfaction.   I, Courtney LOISE Slocumb, MD, have reviewed all documentation for this visit. The documentation on 03/12/24 for the exam, diagnosis, procedures, and orders are all accurate and complete.   IF YOU HAVE BEEN REFERRED TO A SPECIALIST, IT MAY TAKE 1-2 WEEKS TO SCHEDULE/PROCESS THE REFERRAL. IF YOU HAVE NOT HEARD FROM US /SPECIALIST IN TWO WEEKS, PLEASE GIVE US  A CALL AT [phone].   THE PATIENT IS ENCOURAGED TO PRACTICE SOCIAL DISTANCING DUE TO THE COVID-19 PANDEMIC.

## 2024-03-12 NOTE — Patient Instructions (Signed)
 Hypertension, Adult Hypertension is another name for high blood pressure. High blood pressure forces your heart to work harder to pump blood. This can cause problems over time. There are two numbers in a blood pressure reading. There is a top number (systolic) over a bottom number (diastolic). It is best to have a blood pressure that is below 120/80. What are the causes? The cause of this condition is not known. Some other conditions can lead to high blood pressure. What increases the risk? Some lifestyle factors can make you more likely to develop high blood pressure: Smoking. Not getting enough exercise or physical activity. Being overweight. Having too much fat, sugar, calories, or salt (sodium) in your diet. Drinking too much alcohol . Other risk factors include: Having any of these conditions: Heart disease. Diabetes. High cholesterol. Kidney disease. Obstructive sleep apnea. Having a family history of high blood pressure and high cholesterol. Age. The risk increases with age. Stress. What are the signs or symptoms? High blood pressure may not cause symptoms. Very high blood pressure (hypertensive crisis) may cause: Headache. Fast or uneven heartbeats (palpitations). Shortness of breath. Nosebleed. Vomiting or feeling like you may vomit (nauseous). Changes in how you see. Very bad chest pain. Feeling dizzy. Seizures. How is this treated? This condition is treated by making healthy lifestyle changes, such as: Eating healthy foods. Exercising more. Drinking less alcohol . Your doctor may prescribe medicine if lifestyle changes do not help enough and if: Your top number is above 130. Your bottom number is above 80. Your personal target blood pressure may vary. Follow these instructions at home: Eating and drinking  If told, follow the DASH eating plan. To follow this plan: Fill one half of your plate at each meal with fruits and vegetables. Fill one fourth of your plate  at each meal with whole grains. Whole grains include whole-wheat pasta, brown rice, and whole-grain bread. Eat or drink low-fat dairy products, such as skim milk or low-fat yogurt. Fill one fourth of your plate at each meal with low-fat (lean) proteins. Low-fat proteins include fish, chicken without skin, eggs, beans, and tofu. Avoid fatty meat, cured and processed meat, or chicken with skin. Avoid pre-made or processed food. Limit the amount of salt in your diet to less than 1,500 mg each day. Do not drink alcohol  if: Your doctor tells you not to drink. You are pregnant, may be pregnant, or are planning to become pregnant. If you drink alcohol : Limit how much you have to: 0-1 drink a day for women. 0-2 drinks a day for men. Know how much alcohol  is in your drink. In the [place] one drink equals one 12 oz bottle of beer (355 mL), one 5 oz glass of wine (148 mL), or one 1 oz glass of hard liquor (44 mL). Lifestyle  Work with your doctor to stay at a healthy weight or to lose weight. Ask your doctor what the best weight is for you. Get at least 30 minutes of exercise that causes your heart to beat faster (aerobic exercise) most days of the week. This may include walking, swimming, or biking. Get at least 30 minutes of exercise that strengthens your muscles (resistance exercise) at least 3 days a week. This may include lifting weights or doing Pilates. Do not smoke or use any products that contain nicotine  or tobacco. If you need help quitting, ask your doctor. Check your blood pressure at home as told by your doctor. Keep all follow-up visits. Medicines Take over-the-counter and prescription medicines  only as told by your doctor. Follow directions carefully. Do not skip doses of blood pressure medicine. The medicine does not work as well if you skip doses. Skipping doses also puts you at risk for problems. Ask your doctor about side effects or reactions to medicines that you should watch  for. Contact a doctor if: You think you are having a reaction to the medicine you are taking. You have headaches that keep coming back. You feel dizzy. You have swelling in your ankles. You have trouble with your vision. Get help right away if: You get a very bad headache. You start to feel mixed up (confused). You feel weak or numb. You feel faint. You have very bad pain in your: Chest. Belly (abdomen). You vomit more than once. You have trouble breathing. These symptoms may be an emergency. Get help right away. Call 911. Do not wait to see if the symptoms will go away. Do not drive yourself to the hospital. Summary Hypertension is another name for high blood pressure. High blood pressure forces your heart to work harder to pump blood. For most people, a normal blood pressure is less than 120/80. Making healthy choices can help lower blood pressure. If your blood pressure does not get lower with healthy choices, you may need to take medicine. This information is not intended to replace advice given to you by your health care provider. Make sure you discuss any questions you have with your health care provider. Document Revised: 05/06/2021 Document Reviewed: 05/06/2021 Elsevier Patient Education  2024 [employer].

## 2024-03-12 NOTE — Telephone Encounter (Signed)
 Contacted by pharmacy that patient's insurance does not cover his Chrys needs to try Linzess  and Amitiza  first. Patient has already failed Linzess  this caused diarrhea, will do trial of Amitiza  24 mcg twice daily if patient fails this then we will try to resubmit for Isbrela. Patient was sent a message.

## 2024-03-12 NOTE — Addendum Note (Signed)
 Addended by: CRAIG PALMA on: 03/12/2024 04:38 PM   Modules accepted: Orders

## 2024-03-13 ENCOUNTER — Other Ambulatory Visit (HOSPITAL_COMMUNITY): Payer: Self-pay

## 2024-03-13 ENCOUNTER — Ambulatory Visit: Payer: Self-pay | Admitting: Internal Medicine

## 2024-03-13 LAB — CMP14+EGFR
ALT: 16 IU/L (ref 0–32)
AST: 14 IU/L (ref 0–40)
Albumin: 3.9 g/dL (ref 3.9–4.9)
Alkaline Phosphatase: 115 IU/L (ref 44–121)
BUN/Creatinine Ratio: 14 (ref 9–23)
BUN: 11 mg/dL (ref 6–20)
Bilirubin Total: 0.3 mg/dL (ref 0.0–1.2)
CO2: 25 mmol/L (ref 20–29)
Calcium: 9.2 mg/dL (ref 8.7–10.2)
Chloride: 103 mmol/L (ref 96–106)
Creatinine, Ser: 0.81 mg/dL (ref 0.57–1.00)
Globulin, Total: 3.1 g/dL (ref 1.5–4.5)
Glucose: 81 mg/dL (ref 70–99)
Potassium: 4.5 mmol/L (ref 3.5–5.2)
Sodium: 141 mmol/L (ref 134–144)
Total Protein: 7 g/dL (ref 6.0–8.5)
eGFR: 95 mL/min/1.73 (ref >59)

## 2024-03-13 LAB — LIPID PANEL
Chol/HDL Ratio: 4.3 ratio (ref 0.0–4.4)
Cholesterol, Total: 185 mg/dL (ref 100–199)
HDL: 43 mg/dL (ref >39)
LDL Chol Calc (NIH): 125 mg/dL — ABNORMAL HIGH (ref 0–99)
Triglycerides: 93 mg/dL (ref 0–149)
VLDL Cholesterol Cal: 17 mg/dL (ref 5–40)

## 2024-03-13 LAB — CBC
Hematocrit: 38.2 % (ref 34.0–46.6)
Hemoglobin: 11.7 g/dL (ref 11.1–15.9)
MCH: 26.1 pg — ABNORMAL LOW (ref 26.6–33.0)
MCHC: 30.6 g/dL — ABNORMAL LOW (ref 31.5–35.7)
MCV: 85 fL (ref 79–97)
Platelets: 373 x10E3/uL (ref 150–450)
RBC: 4.49 x10E6/uL (ref 3.77–5.28)
RDW: 14.2 % (ref 11.7–15.4)
WBC: 4 x10E3/uL (ref 3.4–10.8)

## 2024-03-23 NOTE — Assessment & Plan Note (Signed)
 Hypertension controlled with metoprolol  and amlodipine . Insurance issues with metoprolol  dosage. Amlodipine  causing sleepiness and leg swelling. - Continue metoprolol  50 mg twice daily. - Prescribe amlodipine  at half the current dose. - Advise taking amlodipine  after work. - Refill metoprolol  prescription. - Follow low sodium diet

## 2024-03-23 NOTE — Assessment & Plan Note (Signed)
 Previous labs reviewed, her A1c has been elevated in the past. I will check an A1c today. Reminded to avoid refined sugars including sugary drinks/foods and processed meats including bacon, sausages and deli meats.

## 2024-03-23 NOTE — Assessment & Plan Note (Signed)
 Chronic, most recent Neuro notes reviewed. She will continue with Armodafanil, baclofen and Briumvi as per Neuro.

## 2024-03-23 NOTE — Assessment & Plan Note (Signed)
 Endo referral placed last month, she has yet to be scheduled an appt. I will have referral coordinator call to check on her appt.

## 2024-03-23 NOTE — Assessment & Plan Note (Signed)
 BMI 40.  She was congratulated on her continued weight loss. She is currently on Zepbound  7.5 mg for weight management. - Increase Zepbound  to 10mg  weekly - Optimize protein intake, incorporate strength training into exercise routine

## 2024-03-31 ENCOUNTER — Other Ambulatory Visit: Payer: Self-pay | Admitting: Internal Medicine

## 2024-03-31 DIAGNOSIS — I10 Essential (primary) hypertension: Secondary | ICD-10-CM

## 2024-04-08 ENCOUNTER — Ambulatory Visit (INDEPENDENT_AMBULATORY_CARE_PROVIDER_SITE_OTHER): Admitting: Podiatry

## 2024-04-08 ENCOUNTER — Encounter: Payer: Self-pay | Admitting: Podiatry

## 2024-04-08 DIAGNOSIS — B351 Tinea unguium: Secondary | ICD-10-CM

## 2024-04-08 NOTE — Progress Notes (Signed)
  Subjective:  Patient ID: Courtney Meza, female    DOB: 1985-08-06,   MRN: [id number]  Chief Complaint  Patient presents with   Nail Problem    I am here for a follow-up.  They're doing pretty good, much better.    38 y.o. female presents for follow-up of nail fungus and relate they are doing pretty well. .  . Denies any other pedal complaints. Denies n/v/f/c.   Past Medical History:  Diagnosis Date   Allergy    latex   Anxiety    GERD (gastroesophageal reflux disease)    Hypertension    Multiple sclerosis (HCC)    Neuromuscular disorder (HCC)    Ovarian cyst, left 12/19/2016   Vision abnormalities     Objective:  Physical Exam: Vascular: DP/PT pulses 2/4 bilateral. CFT <3 seconds. Normal hair growth on digits. No edema.  Skin. No lacerations or abrasions bilateral feet. Bilateral hallux and fifth digit nails thickened and dystrophic distally. Good improvement noted.  Musculoskeletal: MMT 5/5 bilateral lower extremities in DF, PF, Inversion and Eversion. Deceased ROM in DF of ankle joint.  Neurological: Sensation intact to light touch.   Assessment:   1. Onychomycosis        Plan:  Patient was evaluated and treated and all questions answered. -Examined patient -Discussed treatment options for painful dystrophic nails  -Culture positive for fungus -Discussed fungal nail treatment options including oral, topical, and laser treatments.  -Finish out course of lamisil  and should have cure.   -Patient to return as needed  Asberry Failing, DPM

## 2024-04-15 ENCOUNTER — Other Ambulatory Visit (HOSPITAL_COMMUNITY): Payer: Self-pay

## 2024-04-15 MED ORDER — OMEPRAZOLE 20 MG PO CPDR
20.0000 mg | DELAYED_RELEASE_CAPSULE | Freq: Every morning | ORAL | 3 refills | Status: AC
  Filled 2024-04-15 – 2024-04-26 (×2): qty 90, 90d supply, fill #0
  Filled 2024-08-01: qty 90, 90d supply, fill #1

## 2024-04-25 ENCOUNTER — Other Ambulatory Visit (HOSPITAL_COMMUNITY): Payer: Self-pay

## 2024-04-26 ENCOUNTER — Other Ambulatory Visit: Payer: Self-pay

## 2024-04-26 ENCOUNTER — Other Ambulatory Visit (HOSPITAL_COMMUNITY): Payer: Self-pay

## 2024-04-29 ENCOUNTER — Other Ambulatory Visit (HOSPITAL_COMMUNITY): Payer: Self-pay

## 2024-05-01 ENCOUNTER — Other Ambulatory Visit: Payer: Self-pay

## 2024-05-01 ENCOUNTER — Other Ambulatory Visit (HOSPITAL_COMMUNITY): Payer: Self-pay

## 2024-05-01 ENCOUNTER — Telehealth (HOSPITAL_COMMUNITY): Payer: Self-pay

## 2024-05-01 NOTE — Telephone Encounter (Signed)
 Pharmacy Patient Advocate Encounter   Received notification from Pt Calls Messages that prior authorization for Zepbound  10MG /0.5ML pen-injectors  is required/requested.   Insurance verification completed.   The patient is insured through [employer].   Per test claim: PA required; PA submitted to above mentioned insurance via Latent Key/confirmation #/EOC [id number] Status is pending

## 2024-05-01 NOTE — Telephone Encounter (Signed)
 PA request has been Received. New Encounter has been or will be created for follow up. For additional info see Pharmacy Prior Auth telephone encounter from 05/01/24.

## 2024-05-02 ENCOUNTER — Other Ambulatory Visit (HOSPITAL_COMMUNITY): Payer: Self-pay

## 2024-05-02 NOTE — Telephone Encounter (Signed)
 Pharmacy Patient Advocate Encounter  Received notification from [employer] that Prior Authorization for Zepbound  10MG /0.5ML pen-injectors  has been DENIED.  Full denial letter will be uploaded to the media tab. See denial reason below.   PA #/Case ID/Reference #: [id number]    *routing to the appeals team

## 2024-05-03 ENCOUNTER — Telehealth: Payer: Self-pay | Admitting: Pharmacist

## 2024-05-03 ENCOUNTER — Other Ambulatory Visit (HOSPITAL_COMMUNITY): Payer: Self-pay

## 2024-05-03 NOTE — Telephone Encounter (Signed)
 Insurance coverage criteria for Zepbound  are based on FDA labeling, which specifies use only in patients with moderate to severe obstructive sleep apnea (OSA). The patient's sleep study shows mild OSA, with an AHI of 7.8, which does not meet the requirements for an appeal.

## 2024-05-04 ENCOUNTER — Other Ambulatory Visit (HOSPITAL_COMMUNITY): Payer: Self-pay

## 2024-05-08 ENCOUNTER — Other Ambulatory Visit (HOSPITAL_COMMUNITY): Payer: Self-pay

## 2024-05-08 MED ORDER — ZEPBOUND 10 MG/0.5ML ~~LOC~~ SOAJ
0.5000 mL | SUBCUTANEOUS | 2 refills | Status: DC
  Filled 2024-05-08: qty 2, 28d supply, fill #0
  Filled 2024-06-13: qty 2, 28d supply, fill #1
  Filled 2024-07-22: qty 2, 28d supply, fill #2

## 2024-05-14 ENCOUNTER — Other Ambulatory Visit (HOSPITAL_COMMUNITY): Payer: Self-pay

## 2024-06-12 ENCOUNTER — Ambulatory Visit: Admitting: Dermatology

## 2024-06-12 ENCOUNTER — Telehealth: Payer: Self-pay | Admitting: *Deleted

## 2024-06-12 ENCOUNTER — Encounter: Payer: Self-pay | Admitting: Neurology

## 2024-06-12 NOTE — Telephone Encounter (Signed)
 Noted thanks. Updated [employer] sheet.

## 2024-06-12 NOTE — Telephone Encounter (Signed)
 Received fax from [employer] asking for updated Dx code for MS.

## 2024-06-13 ENCOUNTER — Other Ambulatory Visit: Payer: Self-pay | Admitting: Physician Assistant

## 2024-06-13 ENCOUNTER — Other Ambulatory Visit (HOSPITAL_COMMUNITY): Payer: Self-pay

## 2024-06-13 MED ORDER — LINACLOTIDE 290 MCG PO CAPS
290.0000 ug | ORAL_CAPSULE | Freq: Every day | ORAL | 1 refills | Status: AC
Start: 2024-06-13 — End: 2024-09-12
  Filled 2024-06-13: qty 90, 90d supply, fill #0

## 2024-06-25 ENCOUNTER — Encounter: Payer: Self-pay | Admitting: Internal Medicine

## 2024-06-25 ENCOUNTER — Other Ambulatory Visit (HOSPITAL_COMMUNITY): Payer: Self-pay

## 2024-06-25 ENCOUNTER — Ambulatory Visit: Payer: Self-pay | Admitting: Internal Medicine

## 2024-06-25 VITALS — BP 118/78 | Temp 98.3°F | Ht 64.0 in | Wt 215.4 lb

## 2024-06-25 DIAGNOSIS — I10 Essential (primary) hypertension: Secondary | ICD-10-CM | POA: Diagnosis not present

## 2024-06-25 DIAGNOSIS — G35A Relapsing-remitting multiple sclerosis: Secondary | ICD-10-CM

## 2024-06-25 DIAGNOSIS — R7309 Other abnormal glucose: Secondary | ICD-10-CM | POA: Diagnosis not present

## 2024-06-25 DIAGNOSIS — Z0000 Encounter for general adult medical examination without abnormal findings: Secondary | ICD-10-CM | POA: Diagnosis not present

## 2024-06-25 DIAGNOSIS — G35D Multiple sclerosis, unspecified: Secondary | ICD-10-CM

## 2024-06-25 DIAGNOSIS — Z23 Encounter for immunization: Secondary | ICD-10-CM

## 2024-06-25 LAB — POCT URINALYSIS DIPSTICK
Bilirubin, UA: NEGATIVE
Glucose, UA: NEGATIVE
Leukocytes, UA: NEGATIVE
Nitrite, UA: NEGATIVE
Protein, UA: POSITIVE — AB
Spec Grav, UA: 1.02 (ref 1.010–1.025)
Urobilinogen, UA: 2 U/dL — AB
pH, UA: 7 (ref 5.0–8.0)

## 2024-06-25 MED ORDER — SERTRALINE HCL 50 MG PO TABS
50.0000 mg | ORAL_TABLET | Freq: Every day | ORAL | 3 refills | Status: AC
  Filled 2024-06-25 – 2024-07-22 (×2): qty 90, 90d supply, fill #0

## 2024-06-25 NOTE — Progress Notes (Signed)
 I,Victoria T Emmitt, CMA,acting as a neurosurgeon for Catheryn LOISE Slocumb, MD.,have documented all relevant documentation on the behalf of Catheryn LOISE Slocumb, MD,as directed by  Catheryn LOISE Slocumb, MD while in the presence of Catheryn LOISE Slocumb, MD.  Subjective:    Patient ID: Courtney Meza , female    DOB: November 21, 1985 , 38 y.o.   MRN: [id number]  Chief Complaint  Patient presents with   Annual Exam    Patient presents today for annual exam. She reports compliance with medications. Denies headache, chest pain, and SOB.  GYN: Dr Estelle[facility]. Letter sent to GYN for mammogram result.    Hypertension    HPI Discussed the use of AI scribe software for clinical note transcription with the patient, who gave verbal consent to proceed.  History of Present Illness Courtney Meza is a 38 year old female who presents for an annual physical exam and blood pressure check.  She has experienced a significant weight loss of 21 pounds since August. She exercises three days a week and is mindful of her protein intake, aiming for at least 100 grams daily. Her goal weight is 180 pounds, and she is currently at 215 pounds.  Her current medications include Zepbound  10 mg, sertraline , and metoprolol . She has not taken baclofen  in over a year and is not taking oxybutynin  or amlodipine . She monitors her blood pressure at work, which usually reads in the 120s to low 130s over 80s and 70s.  She has a history of multiple sclerosis and is receiving Briumvi infusions every six months. She also takes Linzess  for gastrointestinal issues.  She had a colonoscopy before her birthday due to a family history of colon issues, which revealed a benign polyp. No specific timeline for the next colonoscopy was provided.  She had her first mammogram recently, which she described as 'pretty weird.' She performs her own breast exams regularly.  She experiences occasional dizziness and notes that certain medications make her  very sleepy.  She has been meeting virtually with her healthcare providers at [facility] and has not had recent lab work done since August.   Hypertension This is a chronic problem. The current episode started more than 1 year ago. The problem has been gradually improving since onset. The problem is uncontrolled. Pertinent negatives include no anxiety or chest pain. There are no associated agents to hypertension. Risk factors for coronary artery disease include obesity and sedentary lifestyle. Past treatments include beta blockers. The current treatment provides moderate improvement. Compliance problems include exercise.  There is no history of angina. There is no history of chronic renal disease.     Past Medical History:  Diagnosis Date   Allergy    latex   Anxiety    GERD (gastroesophageal reflux disease)    Hypertension    Multiple sclerosis    Neuromuscular disorder (HCC)    Ovarian cyst, left 12/19/2016   Vision abnormalities      Family History  Problem Relation Age of Onset   Colon polyps Mother    Colon cancer Mother    Diabetes Mother    Hypertension Mother    Healthy Father    Diabetes Maternal Grandmother    Hypertension Maternal Grandmother    Liver cancer Maternal Grandfather    Esophageal cancer Neg Hx    Stomach cancer Neg Hx    Rectal cancer Neg Hx      Current Outpatient Medications:    Armodafinil  250 MG tablet, Take 1/2 to 1 tablet (125-250  mg total) by mouth every morning., Disp: 90 tablet, Rfl: 1   baclofen  (LIORESAL ) 10 MG tablet, First week or so, take 1/2 tablet by mouth 3 times a day, then take 1 tablet by mouth 3 times a day, Disp: 270 each, Rfl: 1   Cholecalciferol (VITAMIN D3) 5000 units TABS, Take 1 tablet by mouth daily., Disp: , Rfl:    hydrocortisone  (ANUSOL -HC) 2.5 % rectal cream, Place 1 Application rectally 2 (two) times daily. Use twice daily for the next 7 days, Disp: 30 g, Rfl: 0   ketoconazole  (NIZORAL ) 2 % cream, Apply 1 Application  topically daily., Disp: 60 g, Rfl: 2   linaclotide  (LINZESS ) 290 MCG CAPS capsule, Take 1 capsule (290 mcg total) by mouth daily before breakfast., Disp: 90 capsule, Rfl: 1   metoprolol  tartrate (LOPRESSOR ) 50 MG tablet, Take 1 tablet (50 mg total) by mouth 2 (two) times daily., Disp: 180 tablet, Rfl: 1   omeprazole  (PRILOSEC) 20 MG capsule, Take 1 capsule (20 mg total) by mouth in the morning., Disp: 90 capsule, Rfl: 3   tirzepatide  (ZEPBOUND ) 10 MG/0.5ML Pen, Inject 10 mg into the skin once a week., Disp: 2 mL, Rfl: 1   tirzepatide  (ZEPBOUND ) 10 MG/0.5ML Pen, Inject 10 mg into the skin once a week., Disp: 2 mL, Rfl: 2   tretinoin  (RETIN-A ) 0.025 % cream, Apply pea size amount to the face 2 nights a week on Tu/Th then increase to 3 nights a week in 1 month on M/W/F., Disp: 45 g, Rfl: 1   Ublituximab-xiiy (BRIUMVI) 150 MG/6ML SOLN, Inject into the vein. 6 months, Disp: , Rfl:    oxybutynin  (DITROPAN  XL) 10 MG 24 hr tablet, Take 1 tablet (10 mg total) by mouth at bedtime. (Patient not taking: Reported on 06/25/2024), Disp: 90 tablet, Rfl: 3   sertraline  (ZOLOFT ) 50 MG tablet, Take 1 tablet (50 mg total) by mouth daily., Disp: 90 tablet, Rfl: 3   terconazole  (TERAZOL 7 ) 0.4 % vaginal cream, Place externally at bedtime for 7 days (Patient not taking: Reported on 06/25/2024), Disp: 45 g, Rfl: 3   Allergies  Allergen Reactions   Latex Hives   Valsartan Itching      The patient states she uses none for birth control. Patient's last menstrual period was 05/28/2024 (exact date).. Negative for Dysmenorrhea. Negative for: breast discharge, breast lump(s), breast pain and breast self exam. Associated symptoms include abnormal vaginal bleeding. Pertinent negatives include abnormal bleeding (hematology), anxiety, decreased libido, depression, difficulty falling sleep, dyspareunia, history of infertility, nocturia, sexual dysfunction, sleep disturbances, urinary incontinence, urinary urgency, vaginal discharge  and vaginal itching. Diet regular.The patient states her exercise level is  intermittent.  . The patient's tobacco use is:  Social History   Tobacco Use  Smoking Status Never  Smokeless Tobacco Never  . She has been exposed to passive smoke. The patient's alcohol use is:  Social History   Substance and Sexual Activity  Alcohol Use No    Review of Systems  Constitutional: Negative.   HENT: Negative.    Eyes: Negative.   Respiratory: Negative.    Cardiovascular: Negative.  Negative for chest pain.  Gastrointestinal: Negative.   Endocrine: Negative.   Genitourinary: Negative.   Musculoskeletal: Negative.   Skin: Negative.   Allergic/Immunologic: Negative.   Neurological: Negative.   Hematological: Negative.   Psychiatric/Behavioral: Negative.       Today's Vitals   06/25/24 0832  BP: 118/78  Temp: 98.3 F (36.8 C)  SpO2: 98%  Weight: 215  lb 6.4 oz (97.7 kg)  Height: 5' 4 (1.626 m)   Body mass index is 36.97 kg/m.  Wt Readings from Last 3 Encounters:  06/25/24 215 lb 6.4 oz (97.7 kg)  03/12/24 236 lb 12.8 oz (107.4 kg)  02/19/24 238 lb (108 kg)     Objective:  Physical Exam Vitals and nursing note reviewed.  Constitutional:      Appearance: Normal appearance. She is obese.  HENT:     Head: Normocephalic and atraumatic.     Right Ear: Tympanic membrane, ear canal and external ear normal. There is no impacted cerumen.     Left Ear: Tympanic membrane, ear canal and external ear normal. There is no impacted cerumen.     Mouth/Throat:     Pharynx: No oropharyngeal exudate or posterior oropharyngeal erythema.  Eyes:     Extraocular Movements: Extraocular movements intact.     Conjunctiva/sclera: Conjunctivae normal.     Pupils: Pupils are equal, round, and reactive to light.  Cardiovascular:     Rate and Rhythm: Normal rate and regular rhythm.     Pulses: Normal pulses.     Heart sounds: Normal heart sounds.  Pulmonary:     Effort: Pulmonary effort is  normal.     Breath sounds: Normal breath sounds.  Chest:  Breasts:    Tanner Score is 5.     Right: Normal.     Left: Normal.  Abdominal:     General: Bowel sounds are normal.     Palpations: Abdomen is soft.     Comments: Obese, soft  Genitourinary:    Comments: Deferred  Musculoskeletal:        General: Normal range of motion.     Cervical back: Normal range of motion.  Skin:    General: Skin is warm.  Neurological:     General: No focal deficit present.     Mental Status: She is alert and oriented to person, place, and time.  Psychiatric:        Mood and Affect: Mood normal.        Behavior: Behavior normal.          Assessment And Plan:     Encounter for annual health examination Assessment & Plan: A full exam was performed.  Importance of monthly self breast exams was discussed with the patient.  She is advised to get 30-45 minutes of regular exercise, no less than four to five days per week. Both weight-bearing and aerobic exercises are recommended.  She is advised to follow a healthy diet with at least six fruits/veggies per day, decrease intake of red meat and other saturated fats and to increase fish intake to twice weekly.  Meats/fish should not be fried -- baked, boiled or broiled is preferable. It is also important to cut back on your sugar intake.  Be sure to read labels - try to avoid anything with added sugar, high fructose corn syrup or other sweeteners.  If you must use a sweetener, you can try stevia or monkfruit.  It is also important to avoid artificially sweetened foods/beverages and diet drinks. Lastly, wear SPF 50 sunscreen on exposed skin and when in direct sunlight for an extended period of time.  Be sure to avoid fast food restaurants and aim for at least 60 ounces of water daily.    - Scheduled next year's physical and a four-month blood pressure check. - Continue current weight loss regimen with Zepbound  10 mg. - Ensure adequate protein intake, aiming  for at least 100 grams per day. - Continue regular exercise, aiming for three days a week.    Essential hypertension, benign Assessment & Plan: Chronic, well controlled.  EKG performed, NSR w/o acute changes. Seh will continue with metoprolol  50mg  po twice daily.  She is encouraged to follow a low sodium diet.   - Follow low sodium diet.   Orders: -     POCT urinalysis dipstick -     Microalbumin / creatinine urine ratio -     EKG 12-Lead  Relapsing remitting multiple sclerosis Assessment & Plan: Chronic, most recent Neuro notes reviewed. She will continue with Armodafanil, baclofen  and Briumvi as per Neuro.    Other abnormal glucose Assessment & Plan: Previous labs reviewed, her A1c has been elevated in the past. I will check an A1c today. Reminded to avoid refined sugars including sugary drinks/foods and processed meats including bacon, sausages and deli meats.     Morbid obesity due to excess calories (HCC) Assessment & Plan: BMI 36 w/ HTN. She is s/p gastric sleeve surgery May 2024. She is now on Zepbound , managed by [facility].  Goal 180 lbs.  - She was congratulated on her weight loss thus far.  - Continue Zepbound  10 mg. - Ensure adequate protein intake, aiming for at least 100 grams per day. - Continue regular exercise, aiming for three days a week.   Immunization due -     Flu vaccine trivalent PF, 6mos and older(Flulaval,Afluria,Fluarix,Fluzone)  Other orders -     Sertraline  HCl; Take 1 tablet (50 mg total) by mouth daily.  Dispense: 90 tablet; Refill: 3   Return for 1 YEAR HM, 4 month bp check. Patient was given opportunity to ask questions. Patient verbalized understanding of the plan and was able to repeat key elements of the plan. All questions were answered to their satisfaction.   I, Catheryn LOISE Slocumb, MD, have reviewed all documentation for this visit. The documentation on 06/25/24 for the exam, diagnosis, procedures, and orders are all accurate and complete.

## 2024-06-25 NOTE — Patient Instructions (Signed)

## 2024-06-26 ENCOUNTER — Other Ambulatory Visit (HOSPITAL_COMMUNITY): Payer: Self-pay

## 2024-06-26 ENCOUNTER — Ambulatory Visit: Payer: Self-pay | Admitting: Internal Medicine

## 2024-06-26 LAB — MICROALBUMIN / CREATININE URINE RATIO
Creatinine, Urine: 306.8 mg/dL
Microalb/Creat Ratio: 4 mg/g{creat} (ref 0–29)
Microalbumin, Urine: 13.7 ug/mL

## 2024-07-06 DIAGNOSIS — Z0000 Encounter for general adult medical examination without abnormal findings: Secondary | ICD-10-CM | POA: Insufficient documentation

## 2024-07-06 NOTE — Assessment & Plan Note (Signed)
 Chronic, most recent Neuro notes reviewed. She will continue with Armodafanil, baclofen and Briumvi as per Neuro.

## 2024-07-06 NOTE — Assessment & Plan Note (Signed)
 Chronic, well controlled.  EKG performed, NSR w/o acute changes. Seh will continue with metoprolol  50mg  po twice daily.  She is encouraged to follow a low sodium diet.   - Follow low sodium diet.

## 2024-07-06 NOTE — Assessment & Plan Note (Signed)
 Previous labs reviewed, her A1c has been elevated in the past. I will check an A1c today. Reminded to avoid refined sugars including sugary drinks/foods and processed meats including bacon, sausages and deli meats.

## 2024-07-06 NOTE — Assessment & Plan Note (Addendum)
 A full exam was performed.  Importance of monthly self breast exams was discussed with the patient.  She is advised to get 30-45 minutes of regular exercise, no less than four to five days per week. Both weight-bearing and aerobic exercises are recommended.  She is advised to follow a healthy diet with at least six fruits/veggies per day, decrease intake of red meat and other saturated fats and to increase fish intake to twice weekly.  Meats/fish should not be fried -- baked, boiled or broiled is preferable. It is also important to cut back on your sugar intake.  Be sure to read labels - try to avoid anything with added sugar, high fructose corn syrup or other sweeteners.  If you must use a sweetener, you can try stevia or monkfruit.  It is also important to avoid artificially sweetened foods/beverages and diet drinks. Lastly, wear SPF 50 sunscreen on exposed skin and when in direct sunlight for an extended period of time.  Be sure to avoid fast food restaurants and aim for at least 60 ounces of water daily.    - Scheduled next year's physical and a four-month blood pressure check. - Continue current weight loss regimen with Zepbound  10 mg. - Ensure adequate protein intake, aiming for at least 100 grams per day. - Continue regular exercise, aiming for three days a week.

## 2024-07-06 NOTE — Assessment & Plan Note (Signed)
 BMI 36 w/ HTN. She is s/p gastric sleeve surgery May 2024. She is now on Zepbound , managed by [facility].  Goal 180 lbs.  - She was congratulated on her weight loss thus far.  - Continue Zepbound  10 mg. - Ensure adequate protein intake, aiming for at least 100 grams per day. - Continue regular exercise, aiming for three days a week.

## 2024-07-22 ENCOUNTER — Other Ambulatory Visit (HOSPITAL_COMMUNITY): Payer: Self-pay

## 2024-07-22 ENCOUNTER — Other Ambulatory Visit: Payer: Self-pay | Admitting: Dermatology

## 2024-07-22 ENCOUNTER — Other Ambulatory Visit: Payer: Self-pay

## 2024-07-22 ENCOUNTER — Other Ambulatory Visit: Payer: Self-pay | Admitting: Podiatry

## 2024-07-22 ENCOUNTER — Other Ambulatory Visit: Payer: Self-pay | Admitting: Neurology

## 2024-07-22 MED ORDER — TRETINOIN 0.025 % EX CREA
TOPICAL_CREAM | CUTANEOUS | 1 refills | Status: AC
  Filled 2024-07-22: qty 45, 30d supply, fill #0

## 2024-07-22 MED ORDER — KETOCONAZOLE 2 % EX CREA
1.0000 | TOPICAL_CREAM | Freq: Every day | CUTANEOUS | 2 refills | Status: AC
  Filled 2024-07-22: qty 60, 60d supply, fill #0

## 2024-07-22 MED ORDER — ARMODAFINIL 250 MG PO TABS
125.0000 mg | ORAL_TABLET | Freq: Every morning | ORAL | 1 refills | Status: AC
  Filled 2024-07-22 – 2024-08-01 (×2): qty 90, 90d supply, fill #0

## 2024-07-22 NOTE — Telephone Encounter (Signed)
 Requested Prescriptions   Pending Prescriptions Disp Refills   Armodafinil  250 MG tablet 90 tablet 1    Sig: Take 1/2 to 1 tablet (125-250 mg total) by mouth every morning.   Last seen 01/25/24 Next appt 09/05/24  Dispenses   Dispensed Days Supply Quantity Provider Pharmacy  Armodafinil  250 MG tablet 05/03/2024 90 90 tablet Sater, Charlie LABOR, MD [place] [facility]  Armodafinil  250 MG tablet 01/26/2024 90 90 tablet Sater, Charlie LABOR, MD [place] [facility]  Armodafinil  250 MG tablet 10/30/2023 90 90 tablet Sater, Charlie LABOR, MD [place] [facility][email or web address]

## 2024-08-01 ENCOUNTER — Other Ambulatory Visit (HOSPITAL_COMMUNITY): Payer: Self-pay

## 2024-08-02 ENCOUNTER — Other Ambulatory Visit: Payer: Self-pay

## 2024-08-29 ENCOUNTER — Telehealth: Payer: Self-pay | Admitting: *Deleted

## 2024-08-29 NOTE — Telephone Encounter (Signed)
" °  Order given to Luke in [place], [employer]

## 2024-09-05 ENCOUNTER — Ambulatory Visit: Admitting: Neurology

## 2024-09-05 ENCOUNTER — Encounter: Payer: Self-pay | Admitting: Neurology

## 2024-09-05 VITALS — BP 124/86 | HR 84 | Resp 16 | Ht 64.0 in | Wt 197.0 lb

## 2024-09-05 DIAGNOSIS — Z79899 Other long term (current) drug therapy: Secondary | ICD-10-CM

## 2024-09-05 DIAGNOSIS — G35A Relapsing-remitting multiple sclerosis: Secondary | ICD-10-CM

## 2024-09-05 DIAGNOSIS — R3915 Urgency of urination: Secondary | ICD-10-CM

## 2024-09-05 DIAGNOSIS — G4726 Circadian rhythm sleep disorder, shift work type: Secondary | ICD-10-CM

## 2024-09-05 DIAGNOSIS — G4733 Obstructive sleep apnea (adult) (pediatric): Secondary | ICD-10-CM

## 2024-09-05 DIAGNOSIS — R269 Unspecified abnormalities of gait and mobility: Secondary | ICD-10-CM

## 2024-09-05 NOTE — Progress Notes (Signed)
 "  [facility]  PATIENT: Courtney Meza DOB: Nov 27, 1985  REFERRING DOCTOR OR PCP:  Valjean (referred by) is Dr. Rolan;  PCP is Catheryn Slocumb SOURCE: Patient, notes from Dr. Rolan, MRI and lab reports, MRI images 3 onPACS.  _________________________________   HISTORICAL  CHIEF COMPLAINT:  Chief Complaint  Patient presents with   Multiple Sclerosis    Rm10, alone, Courtney Meza follow up    HISTORY OF PRESENT ILLNESS:  Courtney Meza is a 39 y.o. woman with RRMS diagnosed early 2018.      Update 01/25/2024: Courtney Meza switched to Briumvi from DMF due to GI side effects. Courtney Meza last infusion was 03/2024 and next infusion will be in a couple weeks.   Besides the poor tolerability, MRI on 09/06/2022 showed a new lesion in the medulla not present on previous MRIs..   Courtney Meza denies recent exacerbation or new neurologic symptom    Courtney Meza gait is doing well.    Courtney Meza will use the bannister.   Courtney Meza keeps up with others on long walks.   Courtney Meza denies weakness or spasticity.   Courtney Meza leg or arm occasionally jerks  but less than in the past.    No numbness or dysesthesia.    Courtney Meza has urinary urgency.  Courtney Meza took oxybutynin  but stopped due to dry mouth       Vision is doing well.  Courtney Meza needs contacts.  No difficulty with colors.    Courtney Meza has fatigue and some sleepiness, better with armodafinil  250 mg.  Courtney Meza has a variable schedule/  Courtney Meza works third shift 3-4 days a week  Courtney Meza sleeps only 6 hours most nights. Courtney Meza finds Courtney Meza sleeps extra on days Courtney Meza does not work and tries to [employer] a daytime schedule.  Courtney Meza naps many days.      Courtney Meza has excessive sleepiness and sometimes dozes off.    Had mild OSA AHI = 7.8 09/2021 - but this was before some weight loss   Mood is fine - better with sertraline .   Cognition is fine.     Courtney Meza has some back pain but no radiation into legs.  Neck pain is worse the past couple weeks.. Courtney Meza has not taken baclofen  lately  Courtney Meza did gastric sleeve and lost 103 pounds and is fairly stable.     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   EPWORTH SLEEPINESS SCALE  On a scale of 0 - 3 what is the chance of dozing:  Sitting and Reading:   3 Watching TV:    3 Sitting inactive in a public place: 0 Passenger in car for one hour: 0 Lying down to rest in the afternoon: 3 Sitting and talking to someone: 1 Sitting quietly after lunch:  2 In a car, stopped in traffic:  0  Total (out of 24):    1224  (mild EDS).       Courtney Meza History:   In April 2006, while driving home Courtney Meza had the sudden onset of reduced right vision like Courtney Meza was looking through a heavy fog.     Vision improved over the next year but not to baseline and has been stable since..   Later in 2006, Courtney Meza had the onset of right facial weakness.  Courtney Meza had two weeks of facial twitching and then had facial weakness that seemed to develop over minutes to one hour.  Courtney Meza was told Courtney Meza had Bell's palsy.  Courtney Meza is uncertain whether Courtney Meza had complete loss of strength in the upper face.  Courtney Meza had no other major symptoms over the next decade.   However, last year Courtney Meza had numbness in the left arm over a couple weeks The 2006 MRI showed pituitary enlargement.   Prolactin levels were recently elevated and Courtney Meza had a follow-up MRI showing stable pituitary enlargement and a pars intermedius cyst.   On the 09/23/2016 MRI, Courtney Meza had multiple white matter lesions, periventricular > juxtacortical, a left midbrain and right pons lesion.  Brain lesions were not present on the previous MRI.    Courtney Meza has been on Tecfidera  or DMF since 2018.   MRI 06/16/2020 showed 1 focus in the spinal cord not clearly present in 2019.  Courtney Meza opted to remain on Tecfidera .  MRI of the brain 09/06/2022 showed a new lesion in the medulla.  IMAGING The MRI of the brain in October 30, 2004 did not show any brain lesions.   However, the second MRI performed 11/07/2004 appeared to show diffusion weighted hyperintensity in the right optic nerve as well as enhancement of the right optic nervenot present on the  original 10/30/2004 MRI.      The MRI from 09/23/2016 shows multiple T2/FLAIR hyperintense foci in the hemispheres, predominantly in the periventricular white matter but also in the juxtacortical white matter. Additionally there are left midbrain and right pontine lesions.   There is questionable optic nerve enhancement.   MRI brain 06/16/2020 showed T2/FLAIR hyperintense foci, predominantly in the periventricular and juxtacortical white matter in a pattern configuration consistent with chronic demyelinating plaque associated with multiple sclerosis.  None of the foci appear to be acute.  They do not enhance.  Compared to the MRI dated 09/23/2016, there are no new lesions..   No acute findings.  Normal enhancement pattern.  MRI cervical spine 06/16/2020 T2 hyperintense foci at the cervicomedullary junction and within the spinal cord adjacent to C2-C3 and adjacent to C4-C5.  These are consistent with chronic demyelination associated with Courtney Meza.  The focus at C2-C3 was not present on the 2019 MRI and likely represents interval development of a chronic demyelinating plaque.      Degenerative changes at C4-C5 have progressed and there is now mild to moderate spinal stenosis due to a small central disc herniation.  There is no nerve root compression  MRI of the brain 09/06/2022 showed T2/FLAIR hyperintense foci in the cerebral hemispheres predominantly in the periventricular white matter and an additional focus in the posterior right medulla. Foci do not enhance and are consistent with chronic demyelinating plaque associated with multiple sclerosis. The medullary focus was not clearly present on previous MRI and could represent interval demyelination   MRI of the cervical spine 09/06/2022 showed T2 hyperintense foci within the right posterior medulla and adjacent to C2-C3 and C4-C5 in the spinal cord.  The spinal cord foci are unchanged compared to the 2021 MRI while the focus in the medulla has occurred since the 2021  MRI.  Disc protrusion at C4-C5 causing mild spinal stenosis.  The size of the disc protrusion has improved compared to the 2021 MRI.  There is no nerve root compression.  REVIEW OF SYSTEMS: Constitutional: No fevers, chills, sweats, or change in appetite.   Courtney Meza notes some fatigue. Courtney Meza sometimes has trouble with sleep. Eyes: as above Ear, nose and throat: No hearing loss, ear pain, nasal congestion, sore throat Cardiovascular: No chest pain, palpitations Respiratory:  No shortness of breath at rest or with exertion.   No wheezes GastrointestinaI: No nausea, vomiting, diarrhea, abdominal pain, fecal incontinence Genitourinary:  No dysuria, urinary retention or frequency.  No nocturia. Musculoskeletal:  No neck pain, back pain Integumentary: No rash, pruritus, skin lesions Neurological: as above Psychiatric: No depression at this time.  No anxiety Endocrine: No palpitations, diaphoresis, change in appetite, change in weigh or increased thirst Hematologic/Lymphatic:  No anemia, purpura, petechiae. Allergic/Immunologic: No itchy/runny eyes, nasal congestion, recent allergic reactions, rashes  ALLERGIES: Allergies  Allergen Reactions   Latex Hives   Valsartan Itching    HOME MEDICATIONS:  Current Outpatient Medications:    Armodafinil  250 MG tablet, Take 1/2 to 1 tablet (125-250 mg total) by mouth every morning., Disp: 90 tablet, Rfl: 1   baclofen  (LIORESAL ) 10 MG tablet, First week or so, take 1/2 tablet by mouth 3 times a day, then take 1 tablet by mouth 3 times a day, Disp: 270 each, Rfl: 1   Cholecalciferol (VITAMIN D3) 5000 units TABS, Take 1 tablet by mouth daily., Disp: , Rfl:    hydrocortisone  (ANUSOL -HC) 2.5 % rectal cream, Place 1 Application rectally 2 (two) times daily. Use twice daily for the next 7 days, Disp: 30 g, Rfl: 0   ketoconazole  (NIZORAL ) 2 % cream, Apply 1 Application topically daily., Disp: 60 g, Rfl: 2   linaclotide  (LINZESS ) 290 MCG CAPS capsule, Take 1 capsule  (290 mcg total) by mouth daily before breakfast., Disp: 90 capsule, Rfl: 1   metoprolol  tartrate (LOPRESSOR ) 50 MG tablet, Take 1 tablet (50 mg total) by mouth 2 (two) times daily., Disp: 180 tablet, Rfl: 1   omeprazole  (PRILOSEC) 20 MG capsule, Take 1 capsule (20 mg total) by mouth in the morning., Disp: 90 capsule, Rfl: 3   sertraline  (ZOLOFT ) 50 MG tablet, Take 1 tablet (50 mg total) by mouth daily., Disp: 90 tablet, Rfl: 3   terconazole  (TERAZOL 7 ) 0.4 % vaginal cream, Place externally at bedtime for 7 days, Disp: 45 g, Rfl: 3   tirzepatide  (ZEPBOUND ) 10 MG/0.5ML Pen, Inject 10 mg into the skin once a week., Disp: 2 mL, Rfl: 1   tretinoin  (RETIN-A ) 0.025 % cream, Apply pea size amount to the face 2 nights a week on Tu/Th then increase to 3 nights a week in 1 month on M/W/F., Disp: 45 g, Rfl: 1   Ublituximab-xiiy (BRIUMVI) 150 MG/6ML SOLN, Inject into the vein. 6 months, Disp: , Rfl:   PAST MEDICAL HISTORY: Past Medical History:  Diagnosis Date   Allergy    latex   Anxiety    GERD (gastroesophageal reflux disease)    Hypertension    Multiple sclerosis    Neuromuscular disorder (HCC)    Ovarian cyst, left 12/19/2016   Vision abnormalities     PAST SURGICAL HISTORY: Past Surgical History:  Procedure Laterality Date   BARIATRIC SURGERY  12/12/2022   gastric sleeve- [facility]   LAPAROSCOPIC OVARIAN CYSTECTOMY Left 12/19/2016   Procedure: LAPAROSCOPIC LEFT OVARIAN CYSTECTOMY LYSIS OF ADHESIONS;  Surgeon: Danielle Rom, MD;  Location: [facility];  Service: Gynecology;  Laterality: Left;   WISDOM TOOTH EXTRACTION      FAMILY HISTORY: Family History  Problem Relation Age of Onset   Colon polyps Mother    Colon cancer Mother    Diabetes Mother    Hypertension Mother    Healthy Father    Diabetes Maternal Grandmother    Hypertension Maternal Grandmother    Liver cancer Maternal Grandfather    Esophageal cancer Neg Hx    Stomach cancer Neg Hx    Rectal cancer Neg Hx  SOCIAL HISTORY:  Social History   Socioeconomic History   Marital status: Married    Spouse name: Not on file   Number of children: 0   Years of education: Not on file   Highest education level: Not on file  Occupational History   Occupation: LPN  Tobacco Use   Smoking status: Never   Smokeless tobacco: Never  Vaping Use   Vaping status: Never Used  Substance and Sexual Activity   Alcohol use: No   Drug use: Never   Sexual activity: Yes    Birth control/protection: None  Other Topics Concern   Not on file  Social History Narrative   Not on file   Social Drivers of Health   Tobacco Use: Low Risk (09/05/2024)   Patient History    Smoking Tobacco Use: Never    Smokeless Tobacco Use: Never    Passive Exposure: Not on file  Financial Resource Strain: Not on file  Food Insecurity: Low Risk (12/12/2022)   Received from [facility]   Epic    Within the past 12 months, you worried that your food would run out before you got money to buy more: Never true    Within the past 12 months, the food you bought just didn't last and you didn't have money to get more. : Never true  Transportation Needs: No Transportation Needs (12/12/2022)   Received from [facility]    In the past 12 months, has lack of reliable transportation kept you from medical appointments, meetings, work or from getting things needed for daily living? : No  Physical Activity: Not on file  Stress: Not on file  Social Connections: Unknown (12/07/2021)   Received from [facility]   Social Network    Social Network: Not on file  Intimate Partner Violence: Unknown (11/05/2021)   Received from [facility]   HITS    Physically Hurt: Not on file    Insult or Talk Down To: Not on file    Threaten Physical Harm: Not on file    Scream or Curse: Not on file  Depression (PHQ2-9): Low Risk (06/25/2024)   Depression (PHQ2-9)    PHQ-2 Score: 0  Alcohol Screen: Not on file  Housing: Low Risk  (12/12/2022)   Received from [facility]   Epic    What is your living situation today?: I have a steady place to live    Think about the place you live. Do you have problems with any of the following? Choose all that apply:: None/None on this list  Utilities: Low Risk (12/12/2022)   Received from [facility]   Utilities    In the past 12 months has the electric, gas, oil, or water company threatened to shut off services in your home? : No  Health Literacy: Not on file     PHYSICAL EXAM  Vitals:   09/05/24 1503  BP: 124/86  Pulse: 84  Resp: 16  SpO2: 97%  Weight: 197 lb (89.4 kg)  Height: 5' 4 (1.626 m)     Body mass index is 33.81 kg/m.  Neck circ:  16 inches  General: The patient is well-developed and well-nourished and in no acute distress.  Pharynx is Mallampate 0.    Neurologic Exam  Mental status: The patient is alert and oriented x 3 at the time of the examination. The patient has apparent normal recent and remote memory, with an apparently normal attention span and concentration ability.   Speech is  normal.  Cranial nerves: Extraocular movements are full.  Color vision is now symmetric.    Colors are symmetric. Facial strength and sensation is normal.  Trapezius strength is normal.     No obvious hearing deficits are noted.  Motor:  Muscle bulk is normal.   Tone is normal. Strength is  5 / 5 in all 4 extremities.   Sensory: Courtney Meza has normal sensation to touch and vibration in the arms and legs.  Coordination: Cerebellar testing shows good finger-nose-finger bilaterally.  Gait and station: Station is normal.   Gait is normal.  Tandem gait is mildly wide. Romberg is negativ   Reflexes: Deep tendon reflexes are increased in legs, L>R with spread at knees.  No ankle clonus      ASSESSMENT AND PLAN  Relapsing remitting multiple sclerosis - Plan: IgG, IgA, IgM, CBC with Differential/Platelet  High risk medication use - Plan: IgG, IgA, IgM, CBC with  Differential/Platelet  Gait disturbance  Urinary urgency  OSA (obstructive sleep apnea)  Shift work sleep disorder   1.    Continue Briumvi.   Check IgG/IgM and CBC/D.  At next visit, also check CD20.  We will check an MRI of the brain and cervical spine to determine if there is any breakthrough activity.  This is occurring we will need to consider different disease modifying therapy. 2.    Continue Nuvigil  for the shift work disorder/EDS/OSA.      3.    Continue vitamin D  supplementation.  4.    Baclofen  prn spasticity (rarely take now) 5.    Continue Sertraline  for anxity > depression 6.   Courtney Meza will return to see me in 6 months or sooner if there are new or worsening neurologic symptoms.     Kyzer Blowe A. Vear, MD, PhD 09/05/2024, 3:22 PM Certified in Neurology, Clinical Neurophysiology, Sleep Medicine, Pain Medicine and Neuroimaging  [facility] [address] [place], [place] [address] [phone] "

## 2024-09-05 NOTE — Addendum Note (Signed)
 Addended by: ONEITA NEVELYN BRAVO on: 09/05/2024 03:49 PM   Modules accepted: Orders

## 2024-09-06 ENCOUNTER — Ambulatory Visit: Payer: Self-pay | Admitting: Neurology

## 2024-09-06 LAB — CBC WITH DIFFERENTIAL/PLATELET
Basophils Absolute: 0 10*3/uL (ref 0.0–0.2)
Basos: 1 %
EOS (ABSOLUTE): 0.1 10*3/uL (ref 0.0–0.4)
Eos: 4 %
Hematocrit: 37 % (ref 34.0–46.6)
Hemoglobin: 12 g/dL (ref 11.1–15.9)
Immature Grans (Abs): 0 10*3/uL (ref 0.0–0.1)
Immature Granulocytes: 0 %
Lymphocytes Absolute: 1.3 10*3/uL (ref 0.7–3.1)
Lymphs: 34 %
MCH: 27.5 pg (ref 26.6–33.0)
MCHC: 32.4 g/dL (ref 31.5–35.7)
MCV: 85 fL (ref 79–97)
Monocytes Absolute: 0.4 10*3/uL (ref 0.1–0.9)
Monocytes: 10 %
Neutrophils Absolute: 2 10*3/uL (ref 1.4–7.0)
Neutrophils: 51 %
Platelets: 424 10*3/uL (ref 150–450)
RBC: 4.36 x10E6/uL (ref 3.77–5.28)
RDW: 13.2 % (ref 11.7–15.4)
WBC: 3.9 10*3/uL (ref 3.4–10.8)

## 2024-09-06 LAB — IGG, IGA, IGM
IgG (Immunoglobin G), Serum: 1783 mg/dL — ABNORMAL HIGH (ref 586–1602)
IgM (Immunoglobulin M), Srm: 26 mg/dL (ref 26–217)
Immunoglobulin A, (IgA) QN, Serum: 226 mg/dL (ref 87–352)

## 2024-10-24 ENCOUNTER — Ambulatory Visit: Admitting: Internal Medicine

## 2024-11-28 ENCOUNTER — Ambulatory Visit: Admitting: Dermatology

## 2025-03-06 ENCOUNTER — Ambulatory Visit: Admitting: Family Medicine

## 2025-07-07 ENCOUNTER — Encounter: Payer: Self-pay | Admitting: Internal Medicine
